# Patient Record
Sex: Male | Born: 1945
Health system: Southern US, Community
[De-identification: ages and names within clinical notes are randomized; demographics above are authoritative.]

## PROBLEM LIST (undated history)

## (undated) DIAGNOSIS — K8689 Other specified diseases of pancreas: Secondary | ICD-10-CM

## (undated) DIAGNOSIS — Z9889 Other specified postprocedural states: Secondary | ICD-10-CM

## (undated) DIAGNOSIS — Z8719 Personal history of other diseases of the digestive system: Secondary | ICD-10-CM

## (undated) DIAGNOSIS — R7612 Nonspecific reaction to cell mediated immunity measurement of gamma interferon antigen response without active tuberculosis: Secondary | ICD-10-CM

## (undated) DIAGNOSIS — R0609 Other forms of dyspnea: Secondary | ICD-10-CM

## (undated) DIAGNOSIS — Z8619 Personal history of other infectious and parasitic diseases: Secondary | ICD-10-CM

## (undated) DIAGNOSIS — K861 Other chronic pancreatitis: Secondary | ICD-10-CM

## (undated) DIAGNOSIS — R0600 Dyspnea, unspecified: Secondary | ICD-10-CM

## (undated) DIAGNOSIS — D860 Sarcoidosis of lung: Secondary | ICD-10-CM

## (undated) DIAGNOSIS — H409 Unspecified glaucoma: Secondary | ICD-10-CM

## (undated) DIAGNOSIS — N323 Diverticulum of bladder: Secondary | ICD-10-CM

## (undated) DIAGNOSIS — R296 Repeated falls: Secondary | ICD-10-CM

## (undated) DIAGNOSIS — R319 Hematuria, unspecified: Secondary | ICD-10-CM

## (undated) DIAGNOSIS — Z859 Personal history of malignant neoplasm, unspecified: Secondary | ICD-10-CM

## (undated) HISTORY — PX: ESOPHAGOGASTRODUODENOSCOPY: SHX1529

---

## 1980-07-16 HISTORY — PX: HERNIA REPAIR: SHX51

## 1998-08-26 ENCOUNTER — Emergency Department (HOSPITAL_COMMUNITY): Admission: EM | Admit: 1998-08-26 | Discharge: 1998-08-26 | Payer: Self-pay | Admitting: Emergency Medicine

## 1998-10-27 ENCOUNTER — Other Ambulatory Visit: Admission: RE | Admit: 1998-10-27 | Discharge: 1998-10-27 | Payer: Self-pay | Admitting: Internal Medicine

## 1998-11-25 ENCOUNTER — Encounter: Payer: Self-pay | Admitting: Pulmonary Disease

## 1998-11-25 ENCOUNTER — Ambulatory Visit: Admission: RE | Admit: 1998-11-25 | Discharge: 1998-11-25 | Payer: Self-pay | Admitting: Pulmonary Disease

## 2001-03-19 ENCOUNTER — Emergency Department (HOSPITAL_COMMUNITY): Admission: EM | Admit: 2001-03-19 | Discharge: 2001-03-20 | Payer: Self-pay

## 2001-07-20 ENCOUNTER — Encounter: Payer: Self-pay | Admitting: Emergency Medicine

## 2001-07-20 ENCOUNTER — Inpatient Hospital Stay (HOSPITAL_COMMUNITY): Admission: EM | Admit: 2001-07-20 | Discharge: 2001-07-21 | Payer: Self-pay | Admitting: Emergency Medicine

## 2001-07-23 ENCOUNTER — Encounter: Admission: RE | Admit: 2001-07-23 | Discharge: 2001-07-23 | Payer: Self-pay

## 2005-05-04 ENCOUNTER — Emergency Department (HOSPITAL_COMMUNITY): Admission: EM | Admit: 2005-05-04 | Discharge: 2005-05-04 | Payer: Self-pay | Admitting: Emergency Medicine

## 2005-06-10 ENCOUNTER — Emergency Department (HOSPITAL_COMMUNITY): Admission: EM | Admit: 2005-06-10 | Discharge: 2005-06-10 | Payer: Self-pay | Admitting: *Deleted

## 2005-06-15 ENCOUNTER — Emergency Department (HOSPITAL_COMMUNITY): Admission: EM | Admit: 2005-06-15 | Discharge: 2005-06-15 | Payer: Self-pay | Admitting: Emergency Medicine

## 2006-06-25 ENCOUNTER — Emergency Department (HOSPITAL_COMMUNITY): Admission: EM | Admit: 2006-06-25 | Discharge: 2006-06-25 | Payer: Self-pay | Admitting: Emergency Medicine

## 2006-07-15 ENCOUNTER — Ambulatory Visit: Payer: Self-pay | Admitting: Family Medicine

## 2006-07-18 ENCOUNTER — Ambulatory Visit: Payer: Self-pay | Admitting: *Deleted

## 2006-08-30 ENCOUNTER — Emergency Department (HOSPITAL_COMMUNITY): Admission: EM | Admit: 2006-08-30 | Discharge: 2006-08-30 | Payer: Self-pay | Admitting: Emergency Medicine

## 2006-10-31 ENCOUNTER — Emergency Department (HOSPITAL_COMMUNITY): Admission: EM | Admit: 2006-10-31 | Discharge: 2006-11-01 | Payer: Self-pay | Admitting: Emergency Medicine

## 2007-03-26 ENCOUNTER — Emergency Department (HOSPITAL_COMMUNITY): Admission: EM | Admit: 2007-03-26 | Discharge: 2007-03-26 | Payer: Self-pay | Admitting: Emergency Medicine

## 2007-03-27 ENCOUNTER — Emergency Department (HOSPITAL_COMMUNITY): Admission: EM | Admit: 2007-03-27 | Discharge: 2007-03-27 | Payer: Self-pay | Admitting: Emergency Medicine

## 2007-04-02 ENCOUNTER — Encounter (INDEPENDENT_AMBULATORY_CARE_PROVIDER_SITE_OTHER): Payer: Self-pay | Admitting: *Deleted

## 2007-08-21 ENCOUNTER — Emergency Department (HOSPITAL_COMMUNITY): Admission: EM | Admit: 2007-08-21 | Discharge: 2007-08-21 | Payer: Self-pay | Admitting: Emergency Medicine

## 2007-08-24 ENCOUNTER — Emergency Department (HOSPITAL_COMMUNITY): Admission: EM | Admit: 2007-08-24 | Discharge: 2007-08-24 | Payer: Self-pay | Admitting: Emergency Medicine

## 2007-08-27 ENCOUNTER — Ambulatory Visit (HOSPITAL_COMMUNITY): Admission: RE | Admit: 2007-08-27 | Discharge: 2007-08-27 | Payer: Self-pay | Admitting: Internal Medicine

## 2007-09-11 ENCOUNTER — Ambulatory Visit: Payer: Self-pay | Admitting: Internal Medicine

## 2007-09-16 ENCOUNTER — Ambulatory Visit (HOSPITAL_BASED_OUTPATIENT_CLINIC_OR_DEPARTMENT_OTHER): Admission: RE | Admit: 2007-09-16 | Discharge: 2007-09-16 | Payer: Self-pay | Admitting: General Surgery

## 2007-09-16 ENCOUNTER — Encounter (INDEPENDENT_AMBULATORY_CARE_PROVIDER_SITE_OTHER): Payer: Self-pay | Admitting: General Surgery

## 2007-09-16 HISTORY — PX: OTHER SURGICAL HISTORY: SHX169

## 2008-03-28 ENCOUNTER — Emergency Department (HOSPITAL_COMMUNITY): Admission: EM | Admit: 2008-03-28 | Discharge: 2008-03-28 | Payer: Self-pay | Admitting: Emergency Medicine

## 2008-10-15 ENCOUNTER — Emergency Department (HOSPITAL_COMMUNITY): Admission: EM | Admit: 2008-10-15 | Discharge: 2008-10-15 | Payer: Self-pay | Admitting: Family Medicine

## 2009-06-26 ENCOUNTER — Emergency Department (HOSPITAL_COMMUNITY): Admission: EM | Admit: 2009-06-26 | Discharge: 2009-06-26 | Payer: Self-pay | Admitting: Emergency Medicine

## 2009-08-14 ENCOUNTER — Emergency Department (HOSPITAL_COMMUNITY): Admission: EM | Admit: 2009-08-14 | Discharge: 2009-08-14 | Payer: Self-pay | Admitting: Emergency Medicine

## 2009-08-17 ENCOUNTER — Emergency Department (HOSPITAL_COMMUNITY): Admission: EM | Admit: 2009-08-17 | Discharge: 2009-08-17 | Payer: Self-pay | Admitting: Emergency Medicine

## 2009-10-04 ENCOUNTER — Emergency Department (HOSPITAL_COMMUNITY): Admission: EM | Admit: 2009-10-04 | Discharge: 2009-10-04 | Payer: Self-pay | Admitting: Emergency Medicine

## 2009-12-14 ENCOUNTER — Emergency Department (HOSPITAL_COMMUNITY): Admission: EM | Admit: 2009-12-14 | Discharge: 2009-12-14 | Payer: Self-pay | Admitting: Family Medicine

## 2010-08-31 ENCOUNTER — Inpatient Hospital Stay (HOSPITAL_COMMUNITY)
Admission: EM | Admit: 2010-08-31 | Discharge: 2010-09-04 | DRG: 391 | Disposition: A | Discharge status: Home / Self Care | Payer: Self-pay | Attending: Internal Medicine | Admitting: Internal Medicine

## 2010-08-31 ENCOUNTER — Inpatient Hospital Stay (INDEPENDENT_AMBULATORY_CARE_PROVIDER_SITE_OTHER)
Admission: RE | Admit: 2010-08-31 | Discharge: 2010-08-31 | Disposition: A | Discharge status: Home / Self Care | Payer: Self-pay | Source: Ambulatory Visit

## 2010-08-31 ENCOUNTER — Emergency Department (HOSPITAL_COMMUNITY): Payer: Self-pay

## 2010-08-31 DIAGNOSIS — Z8614 Personal history of Methicillin resistant Staphylococcus aureus infection: Secondary | ICD-10-CM

## 2010-08-31 DIAGNOSIS — K279 Peptic ulcer, site unspecified, unspecified as acute or chronic, without hemorrhage or perforation: Secondary | ICD-10-CM | POA: Diagnosis present

## 2010-08-31 DIAGNOSIS — J99 Respiratory disorders in diseases classified elsewhere: Secondary | ICD-10-CM | POA: Diagnosis present

## 2010-08-31 DIAGNOSIS — R918 Other nonspecific abnormal finding of lung field: Secondary | ICD-10-CM | POA: Diagnosis present

## 2010-08-31 DIAGNOSIS — Z23 Encounter for immunization: Secondary | ICD-10-CM

## 2010-08-31 DIAGNOSIS — Z880 Allergy status to penicillin: Secondary | ICD-10-CM

## 2010-08-31 DIAGNOSIS — D869 Sarcoidosis, unspecified: Secondary | ICD-10-CM | POA: Diagnosis present

## 2010-08-31 DIAGNOSIS — D62 Acute posthemorrhagic anemia: Secondary | ICD-10-CM | POA: Diagnosis present

## 2010-08-31 DIAGNOSIS — K920 Hematemesis: Secondary | ICD-10-CM

## 2010-08-31 DIAGNOSIS — K2289 Other specified disease of esophagus: Secondary | ICD-10-CM | POA: Diagnosis present

## 2010-08-31 DIAGNOSIS — K228 Other specified diseases of esophagus: Secondary | ICD-10-CM | POA: Diagnosis present

## 2010-08-31 DIAGNOSIS — K208 Other esophagitis without bleeding: Principal | ICD-10-CM | POA: Diagnosis present

## 2010-08-31 DIAGNOSIS — Z85828 Personal history of other malignant neoplasm of skin: Secondary | ICD-10-CM

## 2010-08-31 LAB — GLUCOSE, CAPILLARY: Glucose-Capillary: 95 mg/dL (ref 70–99)

## 2010-08-31 LAB — URINALYSIS, ROUTINE W REFLEX MICROSCOPIC
Bilirubin Urine: NEGATIVE
Hgb urine dipstick: NEGATIVE
Ketones, ur: NEGATIVE mg/dL
Protein, ur: NEGATIVE mg/dL
Urine Glucose, Fasting: NEGATIVE mg/dL
pH: 5.5 (ref 5.0–8.0)

## 2010-08-31 LAB — COMPREHENSIVE METABOLIC PANEL
AST: 44 U/L — ABNORMAL HIGH (ref 0–37)
Albumin: 3.5 g/dL (ref 3.5–5.2)
Alkaline Phosphatase: 46 U/L (ref 39–117)
BUN: 13 mg/dL (ref 6–23)
CO2: 26 mEq/L (ref 19–32)
Chloride: 103 mEq/L (ref 96–112)
Creatinine, Ser: 0.63 mg/dL (ref 0.4–1.5)
GFR calc non Af Amer: >60 mL/min (ref >60)
Potassium: 4 mEq/L (ref 3.5–5.1)
Total Bilirubin: 0.8 mg/dL (ref 0.3–1.2)

## 2010-08-31 LAB — CBC
HCT: 41.6 % (ref 39.0–52.0)
MCH: 29.8 pg (ref 26.0–34.0)
MCHC: 34.6 g/dL (ref 30.0–36.0)
MCV: 86 fL (ref 78.0–100.0)
Platelets: 239 10*3/uL (ref 150–400)
RDW: 14 % (ref 11.5–15.5)
WBC: 5.9 10*3/uL (ref 4.0–10.5)

## 2010-08-31 LAB — DIFFERENTIAL
Eosinophils Absolute: 0.3 10*3/uL (ref 0.0–0.7)
Eosinophils Relative: 5 % (ref 0–5)
Lymphocytes Relative: 35 % (ref 12–46)
Lymphs Abs: 2.1 10*3/uL (ref 0.7–4.0)
Monocytes Absolute: 0.8 10*3/uL (ref 0.1–1.0)
Monocytes Relative: 14 % — ABNORMAL HIGH (ref 3–12)

## 2010-08-31 LAB — TYPE AND SCREEN
ABO/RH(D): A POS
Antibody Screen: NEGATIVE

## 2010-08-31 LAB — ABO/RH: ABO/RH(D): A POS

## 2010-08-31 LAB — OCCULT BLOOD, POC DEVICE: Fecal Occult Bld: POSITIVE

## 2010-09-01 ENCOUNTER — Inpatient Hospital Stay (HOSPITAL_COMMUNITY): Payer: Self-pay

## 2010-09-01 LAB — COMPREHENSIVE METABOLIC PANEL
BUN: 12 mg/dL (ref 6–23)
CO2: 27 mEq/L (ref 19–32)
Calcium: 8.3 mg/dL — ABNORMAL LOW (ref 8.4–10.5)
Creatinine, Ser: 0.67 mg/dL (ref 0.4–1.5)
GFR calc non Af Amer: >60 mL/min (ref >60)
Glucose, Bld: 86 mg/dL (ref 70–99)
Total Bilirubin: 1.2 mg/dL (ref 0.3–1.2)

## 2010-09-01 LAB — CBC
HCT: 35.3 % — ABNORMAL LOW (ref 39.0–52.0)
Hemoglobin: 12.1 g/dL — ABNORMAL LOW (ref 13.0–17.0)
MCH: 29.7 pg (ref 26.0–34.0)
MCHC: 34.3 g/dL (ref 30.0–36.0)
MCV: 86.5 fL (ref 78.0–100.0)

## 2010-09-01 LAB — MAGNESIUM: Magnesium: 1.7 mg/dL (ref 1.5–2.5)

## 2010-09-01 LAB — MRSA PCR SCREENING: MRSA by PCR: NEGATIVE

## 2010-09-01 LAB — DIFFERENTIAL
Basophils Relative: 0 % (ref 0–1)
Neutrophils Relative %: 54 % (ref 43–77)

## 2010-09-01 LAB — HEMOGLOBIN AND HEMATOCRIT, BLOOD
HCT: 36.4 % — ABNORMAL LOW (ref 39.0–52.0)
Hemoglobin: 12.5 g/dL — ABNORMAL LOW (ref 13.0–17.0)

## 2010-09-01 NOTE — H&P (Signed)
[name], [name]                 ACCOUNT NO.:  [id number]  MEDICAL RECORD NO.:  [id number]           PATIENT TYPE:  E  LOCATION:  [facility]                         FACILITY:  [facility]  PHYSICIAN:  Talmage Nap, MD  DATE OF BIRTH:  04-17-1946  DATE OF ADMISSION:  08/31/2010 DATE OF DISCHARGE:                             HISTORY & PHYSICAL   PRIMARY CARE PHYSICIAN:  Unassigned.  History obtainable from the patient and the patient's sister.  CHIEF COMPLAINT:  Vomiting blood of about 3 days' duration.  HISTORY OF PRESENT ILLNESS:  The patient is a 65 year old African American male who has been in stable health has a history of the sarcoidosis which has been stable and skin cancer status post excision at the back, was seen at the emergency room with 3-day history of intermittent vomiting which was said to contain blood of about 3 days' duration and this was said to be getting progressively worse.  The patient however denied any history of aspirin ingestion or nonsteroidal anti-inflammatory drugs prior to the onset of the vomiting.  The patient has intermittent vomiting.  He was also retching.  He denied any associated abdominal pain.  He denied any chest pain, weakness, and shortness of breath.  He denied any fever.  He denied any chills.  He denied any rigor.  On the date of presentation to the emergency room, the patient had a massive episode of hematemesis and after evaluating the patient, the patient was advised to be admitted and GI consult urgently has been ordered.  PAST MEDICAL HISTORY: 1. Positive for sarcoidosis. 2. Peptic ulcer disease. 3. History of MRSA. 4. History of skin cancer at the back.  PAST SURGICAL HISTORY: 1. Skin cancer at the back, status post excision. 2. Hemorrhoidectomy. 3. Hernia repair.  MEDICATIONS:  He has no known preadmission meds.  ALLERGIES:  PENICILLIN.  SOCIAL HISTORY:  Negative for alcohol or tobacco use.  FAMILY HISTORY:  Positive  for coronary artery disease.  REVIEW OF SYSTEMS:  The patient denies any history of headaches.  No blurry vision.  Nausea persistent.  Hematemesis with associated retching, he denied any chest pain.  No PND or orthopnea.  No abdominal discomfort.  No diarrhea or hematochezia.  No dysuria or hematuria.  No swelling of the lower extremity.  No intolerance to heat or cold and no neuropsychiatric disorder.  PHYSICAL EXAMINATION:  VITAL SIGNS:  On examination, middle-aged man  vomiting blood copiously but not in any acute respiratory distress.   Vital Signs: Blood pressure is 105/79, pulse is 79, respirations 21, and temperature is 98.0. HEENT:  Mild pallor.  Pupils are reactive to light and extraocular muscles are intact. NECK:  No jugular venous distention.  No carotid bruit.  No lymphadenopathy. CHEST:  Clear to auscultation. HEART:  Sounds are 1 and 2. ABDOMEN:  Soft and nontender.  Liver, spleen tip not palpable.  Bowel sounds are positive. EXTREMITIES:  No pedal edema. NEUROLOGIC:  Nonfocal. MUSCULOSKELETAL:  Unremarkable. SKIN:  Normal turgor.  LABORATORY DATA:  Lab data lactic acid level is 1.1.  Coagulation profile. APTT is 27.  Fecal occult blood  test is said to be positive. Lipase is 19.  Chemistry showed sodium of 136, potassium 4.0, chloride of 102 with a bicarb of 26, glucose is 93, BUN is 13, creatinine 0.60. LFT AST 44, ALT 32, alkaline phosphatase 46, and total bilirubin 0.8. Urinalysis unremarkable.  PT 14.2, INR 1.08.  Hematological indices showed WBC of 5.9, hemoglobin of 14.4, hematocrit of 41.6, MCV of 6.0 with a platelet count of 239, monocyte of 14%.  Imaging studies done include chest x-ray which showed chronic interstitial disease consistent with sarcoidosis with right bibasilar infiltrate.  IMPRESSION: 1. Upper gastrointestinal bleed,Questionable Mallory-weiss tear. 2. Peptic ulcer disease. 3. Sarcoidosis, stable. 4. History of skin cancer of the back,  status post excision.  PLAN:  To admit the patient to step-down.  The patient will be given normal saline IV to go at the rate of 125 mL an hour, morphine 2 mg IV q.4 p.r.n. pain control, Zofran 4 mg IV q.4 p.r.n. nausea and vomiting, and Phenergan 12.5 mg q.4 p.r.n. vomiting.  He will be on Protonix  80mg  IV stat and subsequently Protonix 8mg  per hour continuous infusion. The patient had an NG tube inserted.  He also had TED stockings for DVT prophylaxis.  GI has been consulted stat for evaluation for upper endoscopy.  Further labs to be ordered on this patient include H and H q.6 hourly.  CBC, CMP, and magnesium repeated in a.m.  The patient will be followed and evaluated on day-to-day basis.     Talmage Nap, MD     CN/MEDQ  D:  08/31/2010  T:  08/31/2010  Job:  [id number]  Electronically Signed by Talmage Nap  on 09/01/2010 01:11:45 AM

## 2010-09-02 LAB — BASIC METABOLIC PANEL
BUN: 9 mg/dL (ref 6–23)
Chloride: 108 mEq/L (ref 96–112)
Creatinine, Ser: 0.7 mg/dL (ref 0.4–1.5)
GFR calc non Af Amer: >60 mL/min (ref >60)
Glucose, Bld: 98 mg/dL (ref 70–99)

## 2010-09-02 LAB — CBC
HCT: 36.5 % — ABNORMAL LOW (ref 39.0–52.0)
MCH: 29.9 pg (ref 26.0–34.0)
MCV: 85.9 fL (ref 78.0–100.0)
Platelets: 201 10*3/uL (ref 150–400)
RDW: 14 % (ref 11.5–15.5)

## 2010-09-03 ENCOUNTER — Inpatient Hospital Stay (HOSPITAL_COMMUNITY): Payer: Self-pay

## 2010-09-03 LAB — BASIC METABOLIC PANEL
Calcium: 8.8 mg/dL (ref 8.4–10.5)
GFR calc Af Amer: >60 mL/min (ref >60)
GFR calc non Af Amer: >60 mL/min (ref >60)
Glucose, Bld: 80 mg/dL (ref 70–99)
Potassium: 3.9 mEq/L (ref 3.5–5.1)
Sodium: 139 mEq/L (ref 135–145)

## 2010-09-03 LAB — CBC
HCT: 38 % — ABNORMAL LOW (ref 39.0–52.0)
MCHC: 33.9 g/dL (ref 30.0–36.0)
MCV: 85.2 fL (ref 78.0–100.0)
Platelets: 210 10*3/uL (ref 150–400)
RDW: 13.7 % (ref 11.5–15.5)
WBC: 5 10*3/uL (ref 4.0–10.5)

## 2010-09-04 DIAGNOSIS — D869 Sarcoidosis, unspecified: Secondary | ICD-10-CM

## 2010-09-04 DIAGNOSIS — R042 Hemoptysis: Secondary | ICD-10-CM

## 2010-09-04 DIAGNOSIS — J189 Pneumonia, unspecified organism: Secondary | ICD-10-CM

## 2010-09-04 LAB — CBC
HCT: 39.2 % (ref 39.0–52.0)
Hemoglobin: 13.7 g/dL (ref 13.0–17.0)
MCHC: 34.9 g/dL (ref 30.0–36.0)
WBC: 4.8 10*3/uL (ref 4.0–10.5)

## 2010-09-05 NOTE — Consult Note (Addendum)
NAME:  [name], [name]           ACCOUNT NO.:  [id number]  MEDICAL RECORD NO.:  [id number]           PATIENT TYPE:  I  LOCATION:  2925                         FACILITY:  [facility]  PHYSICIAN:  Coralyn Helling, MD        DATE OF BIRTH:  1946/01/03  DATE OF CONSULTATION:  09/04/2010 DATE OF DISCHARGE:                                CONSULTATION   REFERRING PHYSICIAN:  Lonia Blood, MD  REASON FOR CONSULTATION:  Possible hemoptysis.  CHIEF COMPLAINT:  Vomiting blood.  [name] is a 65 year old male who was previously followed by Dr. Stevphen Rochester for a pulmonary sarcoidosis which from what I can gather was diagnosed by bronchoscopic biopsy approximately 30 years ago.  He has not been on medications for this recently, and in fact, for the last several years, he does not have a regular doctor that he sees.  He does also have a history of peptic ulcer disease.  He presented to [facility] on August 31, 2010, with 3 days history of vomitingintermittently with blood.  He was evaluated by Gastroenterology and had an EGD done which showed erosive mucosa at the GE junction, both on the gastric side and the esophageal side which was felt to be the likely source for his hematemesis.  He has since been having a few episodes of cough with phlegm and questionable blood inside as well.  He denies epistaxis, although he has been having some sinus congestion and feels like he is coming down with a cold.  He denies any feverish feeling, chills, or sweats.  He has not had sore throat or difficulty swallowing. He denies chest pain or wheezing.  He denies abdominal pain.  He has not had any lymph nodes swelling or skin rashes.  He denies gland swelling.  PAST MEDICAL HISTORY:  Significant for: 1. Pulmonary sarcoidosis. 2. Skin cancer. 3. Peptic ulcer disease.  He has allergies to PENICILLIN.  OUTPATIENT MEDICATIONS:  He was not on any outpatient medications.  CURRENT MEDICATIONS: 1. Zofran  p.r.n. 2. Morphine p.r.n. 3. Protonix 40 mg IV q.12 h.  FAMILY HISTORY:  Significant for coronary artery disease.  SOCIAL HISTORY:  He is married and lives with his wife in [place]. There is no history of tobacco abuse.  He is semi-retired.  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  REVIEW OF SYSTEMS:  A 12-point review of systems is unremarkable except for what is stated above.  PHYSICAL EXAMINATION:  GENERAL:  He is seen in his hospital room.  He is awake and alert, does not appear to be in acute distress, speaking in full sentences. VITAL SIGNS:  Temperature is 98.7, blood pressure is 155/80, heart rate is 68, respirations 19, oxygen saturation 97% on room air. HEENT:  Pupils reactive.  Extraocular muscles are intact.  There is no sinus tenderness.  He has a clear nasal discharge.  There are no oral lesions. NECK:  No lymphadenopathy, no thyromegaly.  No jugular venous distention. HEART:  S1 and S2, regular rate and       rhythm.  No murmur. CHEST:  He had  coarse breath sounds bilaterally with faint basilar crackles, but there was no wheezing. ABDOMEN:  Thin, soft, nontender, positive bowel sounds. EXTREMITIES:  There is no edema, cyanosis, or clubbing.  Peripheral pulses were palpable and symmetric. NEUROLOGIC:  He was alert and oriented x3.  Cranial nerves II through XII are intact, and he had 5/5 strength. SKIN:  There are no obvious lesions.  WBC is 4.8, hemoglobin is 13.7, hematocrit is 39.2, platelet count is 241.  Sodium is 138, potassium is 3.9, chloride is 106, CO2 is 28, BUN is 9, creatinine is 0.77, calcium is 8.8.  BNP was 57.  MRSA screen was negative.  Stool for occult blood was positive.  Lactic acid was 1.1. Urinalysis was unremarkable.  Chest x-ray from August 31, 2010, showed chronic interstitial changes with right basilar infiltrate. CT scan of the head from September 03, 2010, was unremarkable. CT scan of the chest without contrast from  September 03, 2010, showed numerous enlarged mediastinal and hilar lymph nodes with partial calcification.  There is diffuse central airway thickening associated with right lower lobe airspace disease, and throughout the remainder of the lungs, there were diffuse nodular densities, consistent with diagnosis of sarcoidosis as well as a calcified granuloma in the right middle lobe.  IMPRESSION: 1. Questionable hemoptysis.  He does have a right lower lobe     infiltrate which appears to be new in the background of chronic     changes from his previous diagnosis of sarcoidosis.  He could have     had an aspiration event, related to his nausea, vomiting, and     hematemesis.  He is fairly stable clinically, otherwise.  What I     would recommend is to get the patient a course of antibiotics and     then he will need a followup chest x-ray within the next several     days to determine if his infiltrate is clearing.  I do not think     that he would require bronchoscopy at this time; however, if his     symptoms were to worsen or if he would get progressively worsening     hemoptysis, he would likely need bronchoscopy at that time. 2. Pulmonary sarcoidosis.  He has chronic changes but seems to be     fairly well compensated with his respiratory status, and I do not     think that he would need specific therapy for this at this time,     but he will need to have further followup on an outpatient basis. 3. Hematemesis with a history of peptic ulcer disease and erosive     mucosa at the gastroesophageal junction.  This is being managed by     the hospitalist team. 4. The patient could be discharged home on oral antibiotics and then     have outpatient followup arranged.  I will defer timing of hospital     discharge to the hospitalist team.    Coralyn Helling, MD    VS/MEDQ  D:  09/04/2010  T:  09/05/2010  Job:  [id number]  Electronically Signed by Coralyn Helling MD on 09/05/2010 10:54:50 AM

## 2010-09-12 ENCOUNTER — Ambulatory Visit: Payer: Self-pay | Admitting: Pulmonary Disease

## 2010-09-14 NOTE — Discharge Summary (Signed)
  NAME:  [name], [name]           ACCOUNT NO.:  [id number]  MEDICAL RECORD NO.:  [id number]           PATIENT TYPE:  LOCATION:                                 FACILITY:  PHYSICIAN:  Lonia Blood, M.D.       DATE OF BIRTH:  07/16/1946  DATE OF ADMISSION: DATE OF DISCHARGE:                              DISCHARGE SUMMARY   PRIMARY CARE PHYSICIAN:  Fleet Contras, MD  DISCHARGE DIAGNOSES: 1. Gastrointestinal bleeding due to esophagitis, resolved. 2. Mild acute blood loss anemia. 3. Pulmonary sarcoidosis. 4. Pulmonary infiltrate, probably due to aspirated blood from the     hematemesis.  DISCHARGE MEDICATIONS: 1. Omeprazole 20 mg daily. 2. Levaquin 500 mg daily for 7 days. 3. Zofran 4 mg every 6 hours as needed for nausea.  CONDITION ON DISCHARGE:  [name] was discharged in good condition.  He will follow up with his primary care physician, with the gastroenterologist, Dr. Carman Ching, and with the pulmonologist, Dr. Coralyn Helling.PROCEDURE THIS ADMISSION: 1. On August 22, 2010, the patient underwent an upper endoscopy with     findings of erosive esophagitis. 2. CT scan of the head which was negative for sinusitis. 3. Chest CT without contrast which was positive for extensive     underlying lung disease, reticulonodular pattern consistent with     sarcoidosis, right lower lobe airspace disease, question aspirated     blood.  HISTORY AND PHYSICAL:  Refer to the dictated H and P done by Dr. Beverly Gust.  HOSPITAL COURSE:  [name] is a 65 year old gentleman with known sarcoidosis who presented to the emergency room with complaints of vomiting blood.  He was taken to the endoscopy suite where he was found to have some mild erosive esophagitis.  He was placed on n.p.o. and proton pump inhibitors intravenously.  As soon as we restarted a liquid diet, the patient started vomiting again, traces of blood.  At that point in time, we proceeded with investigation to see if he does  not have any hemoptysis when he swallows the blood.  After careful investigation even though the patient has extensive sarcoidosis, we felt that he still had only hematemesis and no hemoptysis.  We felt that he could benefit from an antibiotic and he will followup in the pulmonary clinic with Dr. Craige Cotta.  The patient's diet was advanced and he remained hemodynamically stable without any significant anemia from his mild hematemesis.  He was continued on a proton pump inhibitor and he will take that treatment in the outpatient setting as well.     Lonia Blood, M.D.     SL/MEDQ  D:  09/04/2010  T:  09/05/2010  Job:  [id number]  cc:   Fleet Contras, M.D. James L. Malon Kindle., M.D. Coralyn Helling, MD  Electronically Signed by Lonia Blood M.D. on 09/14/2010 05:32:19 PM

## 2010-10-08 LAB — GC/CHLAMYDIA PROBE AMP, GENITAL
Chlamydia, DNA Probe: NEGATIVE
GC Probe Amp, Genital: NEGATIVE

## 2010-10-08 LAB — RPR: RPR Ser Ql: NONREACTIVE

## 2010-10-17 NOTE — Consult Note (Signed)
  [name], [name]                 ACCOUNT NO.:  [id number]  MEDICAL RECORD NO.:  [id number]           PATIENT TYPE:  I  LOCATION:  2925                         FACILITY:  [facility]  PHYSICIAN:  Graylin Shiver, M.D.   DATE OF BIRTH:  [birth year]  DATE OF CONSULTATION:  08/31/2010 DATE OF DISCHARGE:                                CONSULTATION   REASON FOR CONSULTATION:  The patient is a 65 year old black male with complaints of vomiting blood for the past 4 days.  He describes the blood is bright and dark.  He has been vomiting about every 15 hours. It feels like something builds up and then he has to vomit.  He denied melena, but his stool is heme positive.  He has no complaints of abdominal pain or heartburn.  He does give a history of peptic ulcer disease 3 years ago diagnosed by EGD done by Dr. Virginia Rochester.  At that time, he apparently had a GI bleed.  He has no other complaints.  PAST MEDICAL HISTORY:  Sarcoidosis.  MEDICATIONS:  None.  SOCIAL HISTORY:  He does not smoke or drink alcohol.  He gives no other medical problems including no problems with his heart, lungs, liver or kidneys.  REVIEW OF SYSTEMS:  He is not complaining of chest pain, shortness of breath, cough or sputum production.  PHYSICAL EXAMINATION:  GENERAL:  He is in no acute distress.  He is nonicteric. VITAL SIGNS:  Currently stable. HEART:  Regular rhythm.  No murmurs. LUNGS:  Are clear. ABDOMEN:  Bowel sounds are normal.  It is soft and nontender.  There is no obvious hepatosplenomegaly.  LABORATORY DATA:  Hemoglobin and hematocrit are 14.4 and 41.6 respectively.  Prothrombin time is 14.2.  Electrolytes are unremarkable.  IMPRESSION: 1. Upper gastrointestinal bleed, etiology unclear. 2. History of peptic ulcer disease. 3. History of sarcoidosis.  PLAN:  The patient is being admitted to the hospital by the [facility].  He will be on PPI therapy.  We will proceed with EGD this evening in the  emergency room to further evaluate his vomiting blood.  He does have an NG tube placed and there is blood in the suction container.          ______________________________ Graylin Shiver, M.D.     SFG/MEDQ  D:  09/01/2010  T:  09/01/2010  Job:  [id number]  cc:   [facility]  Electronically Signed by Herbert Moors MD on 10/17/2010 04:37:59 PM

## 2010-10-17 NOTE — Op Note (Signed)
  NAME:  TSUNEO, [name]           ACCOUNT NO.:  [id number]  MEDICAL RECORD NO.:  [id number]           PATIENT TYPE:  I  LOCATION:  2925                         FACILITY:  [facility]  PHYSICIAN:  Graylin Shiver, M.D.   DATE OF BIRTH:  Sep 03, 1945  DATE OF PROCEDURE:  09/01/2010 DATE OF DISCHARGE:                              OPERATIVE REPORT   INDICATIONS FOR PROCEDURE:  Hematemesis.  Informed consent was obtained after explanation of the risks of bleeding, infection, and perforation.  PREMEDICATION:  Fentanyl 70 mcg IV, Versed 4 mg IV.  PROCEDURE:  With the patient in the left lateral decubitus position, the Pentax gastroscope was inserted into the oropharynx and passed into the esophagus.  It was advanced down the esophagus, then into the stomach, and into the duodenum.  The second portion and bulb of the duodenum looked normal.  The stomach had a normal-appearing pylorus area and normal-appearing antrum.  The body of the stomach looked normal and retroflexion of the upper fundus and cardia also looked normal.  At the esophagogastric junction area, there was erosive-appearing mucosa and on the gastric side, there were some small clots adherent to this region, but there were no clots on the esophageal side.  There was no specific large ulcer.  There was no reason that I could see for any therapeutic measures at this time.  The body of the esophagus and upper portion of the esophagus looked normal.  He tolerated the procedure well without complications.  IMPRESSION:  Erosive mucosa at the region of the esophagogastric junction, both on the gastric side and esophageal side, which I believe is the source for his problem of hematemesis.  PLAN:  Follow H and Hs, place the patient on PPI therapy.  Follow clinically.          ______________________________ Graylin Shiver, M.D.     SFG/MEDQ  D:  09/02/2010  T:  09/02/2010  Job:  [id number]  Electronically Signed by Herbert Moors MD  on 10/17/2010 04:38:01 PM

## 2010-11-28 NOTE — Op Note (Signed)
[name], NO                 ACCOUNT NO.:  [id number]   MEDICAL RECORD NO.:  [id number]          PATIENT TYPE:  AMB   LOCATION:  [facility]                         [facility]ACILITY:  [facility]   PHYSICIAN:  Anselm Pancoast. Weatherly, M.D.DATE OF BIRTH:  [birth year]   DATE OF PROCEDURE:  09/16/2007  DATE OF DISCHARGE:                               OPERATIVE REPORT   PREOPERATIVE DIAGNOSES:  Lesion back that is a low grade squamous cell  carcinoma.   POSTOPERATIVE DIAGNOSES:  Not given.   OPERATION:  Re-excision of lesion back that is greater than 3 cm.  General anesthesia, prone position.   HISTORY:  [name] is a 65 year old black male who I first saw for a  lesion that was at the base of his neck that had been increasing in size  for probably 2 years. This was about the size of a lemon when I first  saw [name] and on August 06, 2007 and I removed the area with local  anesthesia. It looked like a chronically irritated but not actually  infected large sebaceous cyst but the base of the lesion was a much  colorful flower type appearance than the usual sebaceous cyst and I  think I completely excised everything originally. The path report  however showed that this was a low grade Trichilemmal squamous cell  carcinoma with surrounding inflammation and fibrosis and I recommended  that we re-excise the area after the area had healed. The original  incision had been packed like an infected sebaceous cyst and this is  closed over the last 6 weeks, so he has now just got a little bit of  granulation tissue with no evidence of any infection or problems.  He  has of course returned to work and I had recommended that we excise this  today.  I did start [name] on p.o. Keflex last evening and he has had two  doses.  The patient preoperatively had a chest x-ray and laboratory  studies. These studies were unremarkable except that they question  possibly a little nodule on the chest x-ray and recommended a CT if we  do not have a comparison film and we will work that up postoperatively.  The patient's white count was 5700 and he was given a gram of Ancef  preoperatively.  His hematocrit is 42.3.  The patient was induced with  general anesthesia and after he was intubated, we then rolled [name] over  with chest rolls and first had the arms up beside his neck but because  this kind of gives me not the good being able to get as close to the  patient, I put the left side down by the side of his body so I could get  a little closer for exposure approaching the area. I then elected to  excise this transversely after it was prepped with Betadine solution and  removed all the underlying adipose and fascia down to the actual muscle  and basically did not try to dissect out anything but just did an  excision that will include probably a 0.5 cm of fibrous and  surrounding  tissue from where the area was originally excised. There were numerous  little bleeders down at the medial end of the base that required  suturing with 4-0 Vicryl. I used cautery and sharp dissection. After  everything was completely excised, I marked the lesion with a short  stitch towards the base of the neck and a long stitch towards the  midline for orientation. I then raised a little bit of flaps superiorly  and inferiorly so that this area could be brought together with  subcuticular 4-0 Vicryl  and then the skin itself was closed with  interrupted mattress sutures of 3-0 nylon. The patient tolerated the  procedure nicely. I did put about 10 mL of 0.5% Marcaine plain in the  subcutaneous tissue for immediate postop pain management and then triple  antibiotic ointment on the skin itself. The patient tolerated the  procedure nicely and was extubated and will be released after a short  stay in the recovery room. He will continue his antibiotics. I think he  has got 3 days  worth until they are completed. I will give [name] Vicodin for pain. 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 I will see [name] in the office on Monday and we will probably  leave most of the stitches for a total of about 2 weeks.           ______________________________  Anselm Pancoast. Zachery Dakins, M.D.     WJW/MEDQ  D:  09/16/2007  T:  09/16/2007  Job:  [id number]

## 2010-12-01 NOTE — Consult Note (Signed)
[place]. [facility]  Patient:    [name], [name] Visit Number: [id number] MRN: [id number]          Service Type: MED Location: 5000 [phone] Attending Physician:  Effie Berkshire Dictated by:   Sabino Gasser, M.D. Proc. Date: 07/20/01 Admit Date:  07/20/2001 Discharge Date: 07/20/2001   CC:         Dr. 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  The patient states that he awoke this morning, thought he had some spittle, but what came out were copious amounts of blood, and he has vomited a few times as well, and bright blood came out. He soaked a towel with it, and he came to the hospital.  He states he has never had anything occur like this before.  PAST MEDICAL HISTORY:  Notable for sarcoidosis approximately 20 years ago.  He states he was seen by Dr. Jerl Mina about a year ago for complete physical examination and was found to be healthy.  He does not smoke, does not drink.  MEDICATIONS:  None.  ALLERGIES:  No allergies to medication except for PENICILLIN.  PAST SURGICAL HISTORY:  Hernia repair many years ago, and he had rectal prolapse surgery a number of years ago as well.  REVIEW OF SYSTEMS:  The patient states that he has had burning epigastric pain whenever he gets upset.  He would notice that he would get burning in the epigastrium.  This is something he has had intermittently for years and has never really treated it.  The patient states that he had the same pain last night.  His son, who is 23, and he had some differences about church.  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  He denies NSAIDs.  He had no family history of gastrointestinal problems.  He has been treated for a "ulcer" perhaps 20 years  ago.  PHYSICAL EXAMINATION:  GENERAL:  He is an alert, oriented, softspoken man in no distress.  He is well-nourished, well-developed.  VITAL SIGNS:  Temperature 98.4, pulse 103 which eventually came down to 68, blood pressure 122/78, with 96% O2 saturation on room air.  There was no orthostasis noted on examination.  HEENT:  Unremarkable.  NECK:  Thyroid not enlarged, no bruits heard.  LUNGS:  Clear.  HEART:  Regular rate and rhythm without murmur, gallop, or rub.  ABDOMEN:  Benign.  NG was placed, and there was bright red blood from the NG.  LABORATORY DATA:  Pro time, PTT, liver function tests were all normal.  Lipase was normal.  Hemoglobin 14.9, platelet count 278,000.  Chest x-ray showed interstitial lung disease.  IMPRESSION:  This is a 65 year old gentleman with chronic, recurring abdominal pain reminiscent of possible ulcer disease.  There is a remote history of possible ulcer, and he presents with bright red hematemesis.  Rule out acute active ulcer disease.  PLAN:  Endoscopy.  We explained the procedure to the pain with the risks, benefits, and rationale.  He is willing to proceed. Dictated by:   Sabino Gasser, M.D. Attending Physician:  Effie Berkshire DD:  07/20/01 TD:  07/21/01 Job: [id number]

## 2010-12-01 NOTE — Procedures (Signed)
[place]. [facility]  Patient:    [name], [name] Visit Number: [id number] MRN: [id number]          Service Type: MED Location: 5000 [phone] Attending Physician:  Effie Berkshire Dictated by:   Sabino Gasser, M.D. Admit Date:  07/20/2001 Discharge Date: 07/20/2001   CC:         Dewayne Shorter, M.D.  Dr. Robert Bellow, [facility]   Procedure Report  PROCEDURE:  Upper endoscopy with biopsy.  SURGEON:  Sabino Gasser, M.D.  INDICATIONS:  Acute gastrointestinal bleeding.  ANESTHESIA:  Demerol 50 mg and Versed 7.5 mg were given.  DESCRIPTION OF PROCEDURE:  With the patient mildly sedated in the left lateral decubitus position in room #1 of the emergency room at [facility], the Olympus videoscopic endoscope was then inserted in the mouth and passed under direct vision into the esophagus which appeared normal throughout its course. We entered into the stomach.  The fundus and body appeared normal.  The antrum appeared slightly erythematous.  There was a small amount of blood ______ seen in the antrum.  We entered into the duodenal bulb very easily and no abnormalities were seen here or in the second portion of the duodenum as we proceeded.  The endoscope was then slowly withdrawn, taking circumferential views of the entire duodenal mucosa visualized until the endoscope had been pulled back into the stomach, placed on retroflexion to view the stomach from below, and this was photographed.  The endoscope was then straightened.  As we straightened, we saw that there was bleeding emanating from the superior portion of the pyloric channel, an area that we had noted in passing to be somewhat eroded, but minimally so.  We then took a biopsy of this area for Helicobacter and withdrew the endoscope, taking circumferential views of the remaining gastric and esophageal mucosa which otherwise appeared normal.  The patients vital signs and pulse oximeter  remained stable.  The patient tolerated the procedure well and without apparent complications.  FINDINGS:  Friability of the pyloric channel with bleeding seen from this point.  PLAN:  Proton pump inhibitors and may start liquid diet, follow the patient clinically, and follow hematocrit, and await biopsy report. Dictated by:   Sabino Gasser, M.D. Attending Physician:  Effie Berkshire DD:  07/20/01 TD:  07/21/01 Job: [id number]

## 2011-01-31 ENCOUNTER — Inpatient Hospital Stay (INDEPENDENT_AMBULATORY_CARE_PROVIDER_SITE_OTHER)
Admission: RE | Admit: 2011-01-31 | Discharge: 2011-01-31 | Disposition: A | Discharge status: Home / Self Care | Payer: Self-pay | Source: Ambulatory Visit | Attending: Emergency Medicine | Admitting: Emergency Medicine

## 2011-01-31 DIAGNOSIS — H6120 Impacted cerumen, unspecified ear: Secondary | ICD-10-CM

## 2011-01-31 LAB — POCT URINALYSIS DIP (DEVICE)
Bilirubin Urine: NEGATIVE
Leukocytes, UA: NEGATIVE
Nitrite: NEGATIVE
Protein, ur: NEGATIVE mg/dL
pH: 6 (ref 5.0–8.0)

## 2011-02-01 LAB — GC/CHLAMYDIA PROBE AMP, GENITAL
Chlamydia, DNA Probe: NEGATIVE
GC Probe Amp, Genital: NEGATIVE

## 2011-04-06 LAB — PROTIME-INR: Prothrombin Time: 14.4

## 2011-04-06 LAB — URINALYSIS, ROUTINE W REFLEX MICROSCOPIC
Glucose, UA: NEGATIVE
Hgb urine dipstick: NEGATIVE
Ketones, ur: NEGATIVE
Protein, ur: NEGATIVE
Urobilinogen, UA: 1

## 2011-04-06 LAB — CBC
HCT: 40.7
MCV: 88.1
Platelets: 270
RDW: 13.9

## 2011-04-06 LAB — I-STAT 8, (EC8 V) (CONVERTED LAB)
BUN: 15
Bicarbonate: 25.3 — ABNORMAL HIGH
Glucose, Bld: 96
Hemoglobin: 15
pCO2, Ven: 39.2 — ABNORMAL LOW
pH, Ven: 7.417 — ABNORMAL HIGH

## 2011-04-06 LAB — DIFFERENTIAL
Basophils Absolute: 0
Basophils Relative: 0
Eosinophils Absolute: 0.2
Eosinophils Relative: 4

## 2011-04-06 LAB — POCT I-STAT CREATININE: Creatinine, Ser: 0.7

## 2011-04-09 LAB — CBC
HCT: 42.3
Hemoglobin: 14.6
MCHC: 34.7
MCV: 87.9
Platelets: 245
RBC: 4.81
RDW: 14.2
WBC: 5.7

## 2011-04-09 LAB — DIFFERENTIAL
Eosinophils Absolute: 0.2
Lymphocytes Relative: 42
Lymphs Abs: 2.4
Monocytes Relative: 15 — ABNORMAL HIGH
Neutrophils Relative %: 40 — ABNORMAL LOW

## 2011-04-09 LAB — COMPREHENSIVE METABOLIC PANEL
AST: 44 — ABNORMAL HIGH
Albumin: 3.3 — ABNORMAL LOW
Alkaline Phosphatase: 55
BUN: 11
Chloride: 102
GFR calc non Af Amer: >60
Potassium: 4.1
Total Bilirubin: 1

## 2011-04-27 LAB — COMPREHENSIVE METABOLIC PANEL
Albumin: 3.1 — ABNORMAL LOW
Alkaline Phosphatase: 49
BUN: 14
CO2: 27
Chloride: 107
Creatinine, Ser: 0.71
GFR calc non Af Amer: >60
Glucose, Bld: 103 — ABNORMAL HIGH
Potassium: 4.2
Total Bilirubin: 0.5

## 2011-04-27 LAB — DIFFERENTIAL
Basophils Absolute: 0
Basophils Relative: 0
Lymphocytes Relative: 32
Monocytes Absolute: 0.7
Neutro Abs: 2.4
Neutrophils Relative %: 50

## 2011-04-27 LAB — CBC
HCT: 40.5
Hemoglobin: 13.9
MCV: 88
RBC: 4.59
WBC: 4.9

## 2011-04-27 LAB — OCCULT BLOOD X 1 CARD TO LAB, STOOL: Fecal Occult Bld: NEGATIVE

## 2011-04-27 LAB — PROTIME-INR: Prothrombin Time: 13.6

## 2011-04-27 LAB — APTT: aPTT: 30

## 2011-08-24 ENCOUNTER — Emergency Department (HOSPITAL_COMMUNITY)
Admission: EM | Admit: 2011-08-24 | Discharge: 2011-08-25 | Disposition: A | Discharge status: Home / Self Care | Payer: Self-pay | Attending: Emergency Medicine | Admitting: Emergency Medicine

## 2011-08-24 ENCOUNTER — Encounter (HOSPITAL_COMMUNITY): Payer: Self-pay | Admitting: *Deleted

## 2011-08-24 DIAGNOSIS — S339XXA Sprain of unspecified parts of lumbar spine and pelvis, initial encounter: Secondary | ICD-10-CM | POA: Insufficient documentation

## 2011-08-24 DIAGNOSIS — R0609 Other forms of dyspnea: Secondary | ICD-10-CM | POA: Insufficient documentation

## 2011-08-24 DIAGNOSIS — M545 Low back pain, unspecified: Secondary | ICD-10-CM | POA: Insufficient documentation

## 2011-08-24 DIAGNOSIS — S39012A Strain of muscle, fascia and tendon of lower back, initial encounter: Secondary | ICD-10-CM

## 2011-08-24 DIAGNOSIS — R0989 Other specified symptoms and signs involving the circulatory and respiratory systems: Secondary | ICD-10-CM | POA: Insufficient documentation
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
## 2011-08-25 MED ORDER — CYCLOBENZAPRINE HCL 5 MG PO TABS: 5.0000 mg | ORAL_TABLET | Freq: Three times a day (TID) | ORAL | Status: AC | PRN

## 2011-08-25 MED ORDER — IBUPROFEN 600 MG PO TABS: 600.0000 mg | ORAL_TABLET | Freq: Four times a day (QID) | ORAL | Status: AC | PRN

## 2011-08-25 MED ORDER — KETOROLAC TROMETHAMINE 60 MG/2ML IM SOLN
30.0000 mg | Freq: Once | INTRAMUSCULAR | Status: AC
  Administered 2011-08-25: 60 mg via INTRAMUSCULAR
  Filled 2011-08-25: qty 2

## 2011-08-25 NOTE — ED Provider Notes (Signed)
Medical screening examination/treatment/procedure(s) were performed by non-physician practitioner and as supervising physician I was immediately available for consultation/collaboration.  Jasmine Awe, MD 08/25/11 [phone]
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
## 2012-10-27 ENCOUNTER — Other Ambulatory Visit: Payer: Self-pay | Admitting: Infectious Diseases

## 2012-10-27 ENCOUNTER — Ambulatory Visit
Admission: RE | Admit: 2012-10-27 | Discharge: 2012-10-27 | Disposition: A | Discharge status: Home / Self Care | Payer: No Typology Code available for payment source | Source: Ambulatory Visit | Attending: Infectious Diseases | Admitting: Infectious Diseases

## 2012-10-27 DIAGNOSIS — R7611 Nonspecific reaction to tuberculin skin test without active tuberculosis: Secondary | ICD-10-CM

## 2013-03-03 ENCOUNTER — Encounter (HOSPITAL_COMMUNITY): Payer: Self-pay | Admitting: *Deleted

## 2013-03-03 ENCOUNTER — Emergency Department (HOSPITAL_COMMUNITY)
Admission: EM | Admit: 2013-03-03 | Discharge: 2013-03-03 | Discharge status: Against Medical Advice | Payer: BC Managed Care – PPO | Source: Home / Self Care

## 2013-03-03 ENCOUNTER — Telehealth (HOSPITAL_COMMUNITY): Payer: Self-pay | Admitting: *Deleted

## 2013-03-03 DIAGNOSIS — H6123 Impacted cerumen, bilateral: Secondary | ICD-10-CM

## 2013-03-03 NOTE — ED Provider Notes (Signed)
  CSN: [id number]     Arrival date & time 03/03/13  1636 History     First MD Initiated Contact with Patient 03/03/13 1653     Chief Complaint  Patient presents with  . Cerumen Impaction   (Consider location/radiation/quality/duration/timing/severity/associated sxs/prior Treatment) HPI Comments: Per Clyda Hurdle, EMT patient states that he has decreased hearing probably due to buildup of wax. There was no mention of ear pain or other sick symptoms. I was asked by the EMT and she did go ahead and irrigate the ears and I  told her she could. After the ears had been irrigated I went to see the patient and he was not in the room.   Past Medical History  Diagnosis Date  . Sarcoidosis    Past Surgical History  Procedure Laterality Date  . Cancer on neck  2010    back of neck   Family History  Problem Relation Age of Onset  . Hypertension Mother   . Heart failure Father    History  Substance Use Topics  . Smoking status: Never Smoker   . Smokeless tobacco: Not on file  . Alcohol Use: No    Review of Systems  Allergies  Penicillins  Home Medications  No current outpatient prescriptions on file. BP 131/84  Pulse 64  Temp(Src) 98.1 F (36.7 C) (Oral)  Resp 16  SpO2 96% Physical Exam  ED Course   Procedures (including critical care time)  Labs Reviewed - No data to display No results found. 1. Cerumen impaction, bilateral     MDM  Patient had his ears  irrigated till clear. The patient was not seen by the provider because the patient left sometime after having his ears clean.  Hayden Rasmussen, NP 03/03/13 1744  Hayden Rasmussen, NP 03/03/13 1745

## 2013-03-03 NOTE — ED Notes (Addendum)
Pt. not in room. I called pt. to return for assessment by NP and d/c instructions.

## 2013-03-03 NOTE — ED Notes (Signed)
I called pt. and told him we had not finished his assessment and d/c instructions. I asked pt. to come back to finish. [name] 03/03/2013

## 2013-03-03 NOTE — ED Provider Notes (Signed)
Medical screening examination/treatment/procedure(s) were performed by non-physician practitioner and as supervising physician I was immediately available for consultation/collaboration.  Namrata Dangler, M.D.  Jalaya Sarver C Dayn Barich, MD 03/03/13 1846 

## 2013-03-03 NOTE — ED Notes (Signed)
C/o ears being stopped up off and on.  Noticed he could not hear on the phone.  Has not had them flushed out in 2 yrs. here.

## 2013-07-23 ENCOUNTER — Other Ambulatory Visit: Payer: Self-pay | Admitting: Internal Medicine

## 2013-07-23 ENCOUNTER — Ambulatory Visit
Admission: RE | Admit: 2013-07-23 | Discharge: 2013-07-23 | Disposition: A | Discharge status: Home / Self Care | Payer: Medicare HMO | Source: Ambulatory Visit | Attending: Internal Medicine | Admitting: Internal Medicine

## 2013-07-23 DIAGNOSIS — D869 Sarcoidosis, unspecified: Secondary | ICD-10-CM

## 2013-08-07 ENCOUNTER — Encounter: Payer: Self-pay | Admitting: Internal Medicine

## 2013-08-07 ENCOUNTER — Other Ambulatory Visit: Payer: Commercial Managed Care - HMO

## 2013-08-07 ENCOUNTER — Ambulatory Visit (INDEPENDENT_AMBULATORY_CARE_PROVIDER_SITE_OTHER): Payer: Medicare HMO | Admitting: Internal Medicine

## 2013-08-07 VITALS — BP 124/86 | HR 69 | Ht 71.0 in | Wt 185.0 lb

## 2013-08-07 DIAGNOSIS — R0989 Other specified symptoms and signs involving the circulatory and respiratory systems: Secondary | ICD-10-CM

## 2013-08-07 DIAGNOSIS — D869 Sarcoidosis, unspecified: Secondary | ICD-10-CM

## 2013-08-07 DIAGNOSIS — R0609 Other forms of dyspnea: Secondary | ICD-10-CM

## 2013-08-07 DIAGNOSIS — R7611 Nonspecific reaction to tuberculin skin test without active tuberculosis: Secondary | ICD-10-CM

## 2013-08-07 NOTE — Patient Instructions (Signed)
Order- schedule PFT and 6 MWT    Dx dyspnea on exertion  Order- lab- ACE level, Quant TB Gold Assay   Dx Sarcoid  Please bring [place] your recent lab results from your doctor, so we can copy them for out record.

## 2013-08-07 NOTE — Progress Notes (Signed)
08/07/13- 60 yoM never smoker Referred for sarcoidosis.  Pt c/o SOB with exertion, walking up steps, prod cough with white mucous.  Dx'ed with sarcoidosis @68  years old. Hx + PPD, told he did not need treatment. Original diagnosis by bronchoscopy, treated with prednisone. Now in past 2 or 3 years, noticing increased dyspnea exertion and cough with night sweats. No weight change, chest pain, adenopathy. Frequent rashes come and go on chest. 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 CT chest 09/03/10 IMPRESSION:  1. Right lower lobe air space disease as demonstrated on recent  radiographs. This is suspicious for pneumonia. In the setting of  hemoptysis, some of this could represent aspirated blood.  2. Extensive underlying lung disease with a reticular nodular  pattern most likely secondary to the patient's sarcoidosis. A  superimposed infectious granulomatous process cannot be excluded.  3. Diffuse mediastinal and hilar lymph nodes, partly calcified and  likely related to sarcoidosis.  Chest radiographic followup is recommended.  Original Report Authenticated By: Vivia Ewing, M.D.  CXR 07/23/13 IMPRESSION:  Findings consistent with interstitial lung disease related to  sarcoidosis. Unchanged when compared to prior study.  Electronically Signed  By: Skipper Cliche M.D.  On: 07/23/2013 13:22  Prior to Admission medications   Not on File   .pmhh Past Surgical History  Procedure Laterality Date  . Cancer on neck  2010    back of neck  . Hernia repair  1982   Family History  Problem Relation Age of Onset  . Hypertension Mother   . Heart failure Father   . Cancer - Prostate Brother    History   Social History  . Marital Status: Single    Spouse Name: N/A    Number of Children: N/A  . Years of Education: N/A   Occupational History  . Not on file.   Social History Main Topics  . Smoking status: Never Smoker   . Smokeless tobacco:  Never Used  . Alcohol Use: No  . Drug Use: No  . Sexual Activity: Not on file   Other Topics Concern  . Not on file   Social History Narrative  . No narrative on file    ROS-see HPI Constitutional:   No-   weight loss, night sweats, fevers, chills, fatigue, lassitude. HEENT:   No-  headaches, difficulty swallowing, +tooth/dental problems, sore throat,       No-  sneezing, +itching, ear ache, nasal congestion, post nasal drip,  CV:  No-   chest pain, orthopnea, PND, swelling in lower extremities, anasarca,                                  dizziness, palpitations Resp: +shortness of breath with exertion or at rest.              + productive cough,  No non-productive cough,  No- coughing up of blood.              No-   change in color of mucus.  No- wheezing.   Skin: +rash or lesions. GI:  No-   heartburn, indigestion, abdominal pain, nausea, vomiting, diarrhea,                 change in bowel habits, loss of appetite GU: No-   dysuria, change in color of urine, no urgency or frequency.  No- flank pain. MS:  No-   joint pain or swelling.  No- decreased range of motion.  No- back pain. Neuro-     nothing unusual Psych:  No- change in mood or affect. No depression or anxiety.  No memory loss.  OBJ- Physical Exam General- Alert, Oriented, Affect-appropriate, Distress- none acute, trim, NAD Skin- rash-none, lesions- none, excoriation- none Lymphadenopathy- none Head- atraumatic            Eyes- Gross vision intact, PERRLA, conjunctivae and secretions clear            Ears- Hearing, canals-normal            Nose- Clear, no-Septal dev, mucus, polyps, erosion, perforation             Throat- Mallampati II , mucosa clear , drainage- none, tonsils- atrophic Neck- flexible , trachea midline, no stridor , thyroid nl, carotid no bruit Chest - symmetrical excursion , unlabored           Heart/CV- RRR , no murmur , no gallop  , no rub, nl s1 s2                           - JVD- none , edema-  none, stasis changes- none, varices- none           Lung- clear to P&A, wheeze- none, cough- none , dullness-none, rub- none           Chest wall-  Abd-  Br/ Gen/ Rectal- Not done, not indicated Extrem- cyanosis- none, clubbing, none, atrophy- none, strength- nl Neuro- grossly intact to observation

## 2013-08-08 LAB — ANGIOTENSIN CONVERTING ENZYME: ANGIOTENSIN-CONVERTING ENZYME: 94 U/L — AB (ref 8–52)

## 2013-08-10 LAB — QUANTIFERON TB GOLD ASSAY (BLOOD)
Interferon Gamma Release Assay: POSITIVE — AB
QUANTIFERON NIL VALUE: 0.03 [IU]/mL
QUANTIFERON TB AG MINUS NIL: 3.85 [IU]/mL
TB AG VALUE: 3.88 [IU]/mL

## 2013-08-28 ENCOUNTER — Emergency Department (HOSPITAL_COMMUNITY)
Admission: EM | Admit: 2013-08-28 | Discharge: 2013-08-28 | Discharge status: Against Medical Advice | Payer: Medicare HMO | Attending: Emergency Medicine | Admitting: Emergency Medicine

## 2013-08-28 ENCOUNTER — Encounter (HOSPITAL_COMMUNITY): Payer: Self-pay | Admitting: Emergency Medicine

## 2013-08-28 DIAGNOSIS — R042 Hemoptysis: Secondary | ICD-10-CM | POA: Insufficient documentation

## 2013-08-28 NOTE — ED Notes (Signed)
Called x2 for room with no answer

## 2013-08-28 NOTE — ED Notes (Addendum)
Called x1 for room with no answer

## 2013-08-28 NOTE — ED Notes (Signed)
Pt in c/o coughing of blood, states he has had 6 episodes of this since Sunday. States he has a history of same and was told it was coming from a broken blood vessel in his throat. No distress at this time.

## 2013-08-29 ENCOUNTER — Emergency Department (HOSPITAL_COMMUNITY): Payer: Medicare HMO

## 2013-08-29 ENCOUNTER — Emergency Department (HOSPITAL_COMMUNITY)
Admission: EM | Admit: 2013-08-29 | Discharge: 2013-08-29 | Disposition: A | Discharge status: Home / Self Care | Payer: Medicare HMO | Attending: Emergency Medicine | Admitting: Emergency Medicine

## 2013-08-29 ENCOUNTER — Encounter (HOSPITAL_COMMUNITY): Payer: Self-pay | Admitting: Emergency Medicine

## 2013-08-29 DIAGNOSIS — K920 Hematemesis: Secondary | ICD-10-CM | POA: Insufficient documentation

## 2013-08-29 DIAGNOSIS — Z85828 Personal history of other malignant neoplasm of skin: Secondary | ICD-10-CM | POA: Insufficient documentation

## 2013-08-29 DIAGNOSIS — K209 Esophagitis, unspecified without bleeding: Secondary | ICD-10-CM

## 2013-08-29 DIAGNOSIS — Z880 Allergy status to penicillin: Secondary | ICD-10-CM | POA: Insufficient documentation

## 2013-08-29 DIAGNOSIS — Z8619 Personal history of other infectious and parasitic diseases: Secondary | ICD-10-CM | POA: Insufficient documentation

## 2013-08-29 LAB — COMPREHENSIVE METABOLIC PANEL
ALBUMIN: 2.9 g/dL — AB (ref 3.5–5.2)
ALT: 26 U/L (ref 0–53)
AST: 43 U/L — AB (ref 0–37)
Alkaline Phosphatase: 58 U/L (ref 39–117)
BILIRUBIN TOTAL: 0.7 mg/dL (ref 0.3–1.2)
BUN: 15 mg/dL (ref 6–23)
CALCIUM: 8.9 mg/dL (ref 8.4–10.5)
CHLORIDE: 104 meq/L (ref 96–112)
CO2: 25 mEq/L (ref 19–32)
CREATININE: 0.69 mg/dL (ref 0.50–1.35)
GFR calc Af Amer: >90 mL/min (ref >90)
GFR calc non Af Amer: >90 mL/min (ref >90)
Glucose, Bld: 97 mg/dL (ref 70–99)
Potassium: 3.9 mEq/L (ref 3.7–5.3)
Sodium: 138 mEq/L (ref 137–147)
Total Protein: 9.3 g/dL — ABNORMAL HIGH (ref 6.0–8.3)

## 2013-08-29 LAB — CBC WITH DIFFERENTIAL/PLATELET
BASOS ABS: 0 10*3/uL (ref 0.0–0.1)
BASOS PCT: 0 % (ref 0–1)
Eosinophils Absolute: 0.2 10*3/uL (ref 0.0–0.7)
Eosinophils Relative: 3 % (ref 0–5)
HEMATOCRIT: 38.9 % — AB (ref 39.0–52.0)
Hemoglobin: 13.7 g/dL (ref 13.0–17.0)
Lymphocytes Relative: 40 % (ref 12–46)
Lymphs Abs: 2 10*3/uL (ref 0.7–4.0)
MCH: 29.8 pg (ref 26.0–34.0)
MCHC: 35.2 g/dL (ref 30.0–36.0)
MCV: 84.7 fL (ref 78.0–100.0)
MONO ABS: 0.5 10*3/uL (ref 0.1–1.0)
Monocytes Relative: 11 % (ref 3–12)
NEUTROS ABS: 2.3 10*3/uL (ref 1.7–7.7)
Neutrophils Relative %: 46 % (ref 43–77)
Platelets: 255 10*3/uL (ref 150–400)
RBC: 4.59 MIL/uL (ref 4.22–5.81)
RDW: 13.9 % (ref 11.5–15.5)
WBC: 5 10*3/uL (ref 4.0–10.5)

## 2013-08-29 MED ORDER — IOHEXOL 350 MG/ML SOLN
80.0000 mL | Freq: Once | INTRAVENOUS | Status: AC | PRN
  Administered 2013-08-29: 75 mL via INTRAVENOUS

## 2013-08-29 NOTE — ED Notes (Signed)
Pt in c/o coughing up blood, states it has been happening about once a day for about a week, came in last night but was never seen, states it happened again this morning so he was told by his doctor to come to the emergency room, history of this in the past and was told he had a blood vessel that had ruptured in his throat, no distress at this time

## 2013-08-29 NOTE — Discharge Instructions (Signed)
Esophagitis Esophagitis is inflammation of the esophagus. It can involve swelling, soreness, and pain in the esophagus. This condition can make it difficult and painful to swallow. CAUSES  Most causes of esophagitis are not serious. Many different factors can cause esophagitis, including:  Gastroesophageal reflux disease (GERD). This is when acid from your stomach flows up into the esophagus.  Recurrent vomiting.  An allergic-type reaction.  Certain medicines, especially those that come in large pills.  Ingestion of harmful chemicals, such as household cleaning products.  Heavy alcohol use.  An infection of the esophagus.  Radiation treatment for cancer.  Certain diseases such as sarcoidosis, Crohn's disease, and scleroderma. These diseases may cause recurrent esophagitis. SYMPTOMS   Trouble swallowing.  Painful swallowing.  Chest pain.  Difficulty breathing.  Nausea.  Vomiting.  Abdominal pain. DIAGNOSIS  Your caregiver will take your history and do a physical exam. Depending upon what your caregiver finds, certain tests may also be done, including:  Barium X-ray. You will drink a solution that coats the esophagus, and X-rays will be taken.  Endoscopy. A lighted tube is put down the esophagus so your caregiver can examine the area.  Allergy tests. These can sometimes be arranged through follow-up visits. TREATMENT  Treatment will depend on the cause of your esophagitis. In some cases, steroids or other medicines may be given to help relieve your symptoms or to treat the underlying cause of your condition. Medicines that may be recommended include:  Viscous lidocaine, to soothe the esophagus.  Antacids.  Acid reducers.  Proton pump inhibitors.  Antiviral medicines for certain viral infections of the esophagus.  Antifungal medicines for certain fungal infections of the esophagus.  Antibiotic medicines, depending on the cause of the esophagitis. HOME CARE  INSTRUCTIONS   Avoid foods and drinks that seem to make your symptoms worse.  Eat small, frequent meals instead of large meals.  Avoid eating for the 3 hours prior to your bedtime.  If you have trouble taking pills, use a pill splitter to decrease the size and likelihood of the pill getting stuck or injuring the esophagus on the way down. Drinking water after taking a pill also helps.  Stop smoking if you smoke.  Maintain a healthy weight.  Wear loose-fitting clothing. Do not wear anything tight around your waist that causes pressure on your stomach.  Raise the head of your bed 6 to 8 inches with wood blocks to help you sleep. Extra pillows will not help.  Only take over-the-counter or prescription medicines as directed by your caregiver. SEEK IMMEDIATE MEDICAL CARE IF:  You have severe chest pain that radiates into your arm, neck, or jaw.  You feel sweaty, dizzy, or lightheaded.  You have shortness of breath.  You vomit blood.  You have difficulty or pain with swallowing.  You have bloody or black, tarry stools.  You have a fever.  You have a burning sensation in the chest more than 3 times a week for more than 2 weeks.  You cannot swallow, drink, or eat.  You drool because you cannot swallow your saliva. MAKE SURE YOU:  Understand these instructions.  Will watch your condition.  Will get help right away if you are not doing well or get worse. Document Released: 08/09/2004 Document Revised: 09/24/2011 Document Reviewed: 03/02/2011 [facility] Patient Information 2014 [place], [place].  Gastrointestinal Bleeding Gastrointestinal (GI) bleeding means there is bleeding somewhere along the digestive tract, between the mouth and anus. CAUSES  There are many different problems that can  cause GI bleeding. Possible causes include:  Esophagitis. This is inflammation, irritation, or swelling of the esophagus.  Hemorrhoids.These are veins that are full of blood (engorged)  in the rectum. They cause pain, inflammation, and may bleed.  Anal fissures.These are areas of painful tearing which may bleed. They are often caused by passing hard stool.  Diverticulosis.These are pouches that form on the colon over time, with age, and may bleed significantly.  Diverticulitis.This is inflammation in areas with diverticulosis. It can cause pain, fever, and bloody stools, although bleeding is rare.  Polyps and cancer. Colon cancer often starts out as precancerous polyps.  Gastritis and ulcers.Bleeding from the upper gastrointestinal tract (near the stomach) may travel through the intestines and produce black, sometimes tarry, often bad smelling stools. In certain cases, if the bleeding is fast enough, the stools may not be black, but red. This condition may be life-threatening. SYMPTOMS   Vomiting bright red blood or material that looks like coffee grounds.  Bloody, black, or tarry stools. DIAGNOSIS  Your caregiver may diagnose your condition by taking your history and performing a physical exam. More tests may be needed, including:  X-rays and other imaging tests.  Esophagogastroduodenoscopy (EGD). This test uses a flexible, lighted tube to look at your esophagus, stomach, and small intestine.  Colonoscopy. This test uses a flexible, lighted tube to look at your colon. TREATMENT  Treatment depends on the cause of your bleeding.   For bleeding from the esophagus, stomach, small intestine, or colon, the caregiver doing your EGD or colonoscopy may be able to stop the bleeding as part of the procedure.  Inflammation or infection of the colon can be treated with medicines.  Many rectal problems can be treated with creams, suppositories, or warm baths.  Surgery is sometimes needed.  Blood transfusions are sometimes needed if you have lost a lot of blood. If bleeding is slow, you may be allowed to go home. If there is a lot of bleeding, you will need to stay in the  hospital for observation. HOME CARE INSTRUCTIONS   Take any medicines exactly as prescribed.  Keep your stools soft by eating foods that are high in fiber. These foods include whole grains, legumes, fruits, and vegetables. Prunes (1 to 3 a day) work well for many people.  Drink enough fluids to keep your urine clear or pale yellow. SEEK IMMEDIATE MEDICAL CARE IF:   Your bleeding increases.  You feel lightheaded, weak, or you faint.  You have severe cramps in your back or abdomen.  You pass large blood clots in your stool.  Your problems are getting worse. MAKE SURE YOU:   Understand these instructions.  Will watch your condition.  Will get help right away if you are not doing well or get worse. Document Released: 06/29/2000 Document Revised: 06/18/2012 Document Reviewed: 06/11/2011 [facility] Patient Information 2014 [place], [place].

## 2013-08-29 NOTE — ED Provider Notes (Signed)
CSN: [id number]     Arrival date & time 08/29/13  2229 History   First MD Initiated Contact with Patient 08/29/13 1012     Chief Complaint  Patient presents with  . Hemoptysis     (Consider location/radiation/quality/duration/timing/severity/associated sxs/prior Treatment) The history is provided by the patient.  patient presents with "coughing" up blood. It  is similar episode previously. States that was a vessel that was irritated. He is unable to tell me whether he is vomiting an upper this hemoptysis. No pain. He states he's felt a little fatigued. No new shortness of breath. Patient states she's had episodes since Sunday, 6 days ago. He states he has thrown up a half cup of blood a few times. He states his been clots. No other bleeding. He denies black stool.  Past Medical History  Diagnosis Date  . Sarcoidosis   . Hx of nonmelanoma skin cancer   . Upper GI bleeding     20 13 upper GI bleed    Past Surgical History  Procedure Laterality Date  . Cancer on neck  2010    back of neck  . Hernia repair  1982   Family History  Problem Relation Age of Onset  . Hypertension Mother   . Heart failure Father   . Cancer - Prostate Brother    History  Substance Use Topics  . Smoking status: Never Smoker   . Smokeless tobacco: Never Used  . Alcohol Use: No    Review of Systems  Constitutional: Negative for activity change and appetite change.  Eyes: Negative for pain.  Respiratory: Positive for cough. Negative for chest tightness and shortness of breath.   Cardiovascular: Negative for chest pain and leg swelling.  Gastrointestinal: Negative for nausea, vomiting, abdominal pain and diarrhea.  Genitourinary: Negative for flank pain.  Musculoskeletal: Negative for back pain and neck stiffness.  Skin: Negative for rash.  Neurological: Negative for weakness, numbness and headaches.  Psychiatric/Behavioral: Negative for behavioral problems.      Allergies  Penicillins  Home  Medications  No current outpatient prescriptions on file. BP 132/87  Pulse 62  Temp(Src) 98.7 F (37.1 C) (Oral)  Resp 20  Wt 185 lb (83.915 kg)  SpO2 95% Physical Exam  Nursing note and vitals reviewed. Constitutional: He is oriented to person, place, and time. He appears well-developed and well-nourished.  HENT:  Head: Normocephalic and atraumatic.  Mild dried blood on soft palate. Mild blood in right nare  Eyes: EOM are normal. Pupils are equal, round, and reactive to light.  Neck: Normal range of motion. Neck supple.  Cardiovascular: Normal rate, regular rhythm and normal heart sounds.   No murmur heard. Pulmonary/Chest: Effort normal and breath sounds normal.  Abdominal: Soft. Bowel sounds are normal. He exhibits no distension and no mass. There is no tenderness. There is no rebound and no guarding.  Musculoskeletal: Normal range of motion. He exhibits no edema.  Neurological: He is alert and oriented to person, place, and time. No cranial nerve deficit.  Skin: Skin is warm and dry.  Psychiatric: He has a normal mood and affect.    ED Course  Procedures (including critical care time) Labs Review Labs Reviewed  CBC WITH DIFFERENTIAL - Abnormal; Notable for the following:    HCT 38.9 (*)    All other components within normal limits  COMPREHENSIVE METABOLIC PANEL - Abnormal; Notable for the following:    Total Protein 9.3 (*)    Albumin 2.9 (*)    AST 43 (*)  All other components within normal limits   Imaging Review Dg Chest 2 View  08/29/2013   CLINICAL DATA:  Hemoptysis and hematemesis.  History of sarcoidosis.  EXAM: CHEST  2 VIEW  COMPARISON:  Chest x-ray 07/23/2013.  FINDINGS: Diffuse reticulonodular appearance throughout the lungs is similar to numerous prior examinations. However, there appears to be increasing opacity in the medial aspect of the left lower lobe, which is concerning for an area of acute airspace consolidation. No definite pleural effusions.  Findings are not favored to represent pulmonary edema. Heart size is normal. Mediastinal contours are unremarkable.  IMPRESSION: 1. New airspace consolidation in the left lower lobe concerning for potential pneumonia or sequela of aspiration. 2. Chronic lung changes related to underlying sarcoidosis redemonstrated, with a diffuse reticulonodular pattern similar to prior studies.   Electronically Signed   By: Vinnie Langton M.D.   On: 08/29/2013 11:42   Ct Angio Chest W/cm &/or Wo Cm  08/29/2013   CLINICAL DATA:  Chest pain, hemoptysis and pulmonary infiltrate. History of sarcoidosis.  EXAM: CT ANGIOGRAPHY CHEST WITH CONTRAST  TECHNIQUE: Multidetector CT imaging of the chest was performed using the standard protocol during bolus administration of intravenous contrast. Multiplanar CT image reconstructions and MIPs were obtained to evaluate the vascular anatomy.  CONTRAST:  37mL OMNIPAQUE IOHEXOL 350 MG/ML SOLN  COMPARISON:  DG CHEST 2 VIEW dated 08/29/2013; DG CHEST 2 VIEW dated 07/23/2013; DG CHEST 1 VIEW dated 10/27/2012; CT CHEST W/O CM dated 09/03/2010  FINDINGS: The pulmonary arteries are well opacified. There is no evidence of pulmonary embolism. Lungs demonstrate acute appearing airspace disease in the posterior right lower lobe. A similar pattern of airspace disease was present by CT in 2012. Differential considerations include acute pneumonia and pulmonary hemorrhage. Part of this appearance may also be chronic. Visualized airways are normally patent.  There also is an underlying pattern of significant chronic lung disease with prominent interstitium throughout both lungs as well as multiple areas of parenchymal scarring and reticulonodular lung disease. Associated prominent lymph nodes in the hila bilaterally and mediastinum likely related to known underlying sarcoidosis. Some of these lymph nodes are partially calcified.  No pleural or pericardial fluid is seen. The heart size is at the upper limits of  normal. Mild degenerative changes are present in the lower thoracic spine.  Review of the MIP images confirms the above findings.  IMPRESSION: 1. Right lower lobe airspace disease. As above, differential considerations include pneumonia, pulmonary hemorrhage and chronic parenchymal disease. 2. No evidence of pulmonary embolism. 3. Significant underlying chronic lung disease with associated lymphadenopathy. Findings are most likely consistent with known sarcoidosis.   Electronically Signed   By: Aletta Edouard M.D.   On: 08/29/2013 13:06    EKG Interpretation   None       MDM   Final diagnoses:  Hematemesis  Esophagitis    Patient with blood coming out of his mouth. Unsure of respiratory or GI related. There is some mild blood in his nose. Patient is similar episode a couple years ago that was esophagitis. X-ray showed left lower lobe infiltrate. CT scan was done to rule out pulmonary embolism and clarified infiltrate. No infiltrate in the left lower lobe, but there are chronic changes in the right lung. This point this is likely he upper GI bleed. Hemoglobin is stable. Good vitals. Facial discharge home to follow up with his primary care doctor or gastroenterology. He was instructed to return for increased bleeding or lightheadedness or dizziness.  Jasper Riling. Alvino Chapel, MD 08/29/13 1346

## 2013-09-04 ENCOUNTER — Encounter: Payer: Self-pay | Admitting: Internal Medicine

## 2013-09-04 DIAGNOSIS — R0609 Other forms of dyspnea: Principal | ICD-10-CM

## 2013-09-04 DIAGNOSIS — D869 Sarcoidosis, unspecified: Secondary | ICD-10-CM | POA: Insufficient documentation

## 2013-09-04 DIAGNOSIS — R7611 Nonspecific reaction to tuberculin skin test without active tuberculosis: Secondary | ICD-10-CM | POA: Insufficient documentation

## 2013-09-04 NOTE — Assessment & Plan Note (Signed)
Need to define activity status Plan-schedule PFT with 6 minute walk, lab for ACE level, TB assay

## 2013-09-04 NOTE — Assessment & Plan Note (Signed)
Plan-schedule PFT and 6 minute walk test

## 2013-09-04 NOTE — Assessment & Plan Note (Signed)
Given history of sarcoid, potential for  Confusion. Active sarcoid will often suppress PPD skin test. We do not know of a TB contact. Plan- Quant TB Gold assay

## 2013-09-07 ENCOUNTER — Ambulatory Visit (INDEPENDENT_AMBULATORY_CARE_PROVIDER_SITE_OTHER): Payer: Commercial Managed Care - HMO | Admitting: Internal Medicine

## 2013-09-07 ENCOUNTER — Encounter: Payer: Self-pay | Admitting: Internal Medicine

## 2013-09-07 ENCOUNTER — Other Ambulatory Visit (INDEPENDENT_AMBULATORY_CARE_PROVIDER_SITE_OTHER): Payer: Medicare HMO

## 2013-09-07 VITALS — BP 115/72 | HR 67 | Ht 71.0 in | Wt 182.4 lb

## 2013-09-07 DIAGNOSIS — D869 Sarcoidosis, unspecified: Secondary | ICD-10-CM | POA: Diagnosis not present

## 2013-09-07 DIAGNOSIS — R7611 Nonspecific reaction to tuberculin skin test without active tuberculosis: Secondary | ICD-10-CM | POA: Diagnosis not present

## 2013-09-07 DIAGNOSIS — R042 Hemoptysis: Secondary | ICD-10-CM

## 2013-09-07 LAB — APTT: APTT: 28.1 s (ref 21.7–28.8)

## 2013-09-07 LAB — PROTIME-INR
INR: 1.3 ratio — AB (ref 0.8–1.0)
Prothrombin Time: 13.7 s — ABNORMAL HIGH (ref 10.2–12.4)

## 2013-09-07 NOTE — Assessment & Plan Note (Signed)
CT scans from now and 2 years ago look very similar to me. ACE level is elevated. Hemoptysis could be related to sarcoid related scarring/ traction bronchiectasis. No overt infection. No bleeding in last 2 days. Before considering steroid therapy now, want to clarify TB status.  I recommended bronchoscopy to look clarify area of bleeding and to culture for TB. He agrees to have me talk with my colleagues about that.

## 2013-09-07 NOTE — Patient Instructions (Signed)
Order- lab- PT, PTT    Dx hemoptysis  I will speak with one of my associates about doing a bronchoscopy to look in your lungs to see if you might have TB, and to see where the blood is coming from. We will call you about this in the next day or 2.

## 2013-09-07 NOTE — Assessment & Plan Note (Signed)
CXR questions new pneumonia or possibly blood. Intermittent sweats without fever, leukocytosis or purulent sputum. CT does not show obvious cavity, but there is a lot of old scarring. PCP had recommended GI eval for ? GI source, but patient deferred that appointment pending pulmonary evaluation. He denies any sense of heart burn or reflux currently. Plan- coags

## 2013-09-07 NOTE — Progress Notes (Signed)
08/07/13- 6 yoM never smoker Referred for sarcoidosis.  Pt c/o SOB with exertion, walking up steps, prod cough with white mucous.  Dx'ed with sarcoidosis @68  years old. Hx + PPD, told he did not need treatment. Original diagnosis by bronchoscopy, treated with prednisone. Now in past 2 or 3 years, noticing increased dyspnea exertion and cough with night sweats. No weight change, chest pain, adenopathy. Frequent rashes come and go on chest. 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 CT chest 09/03/10 IMPRESSION:  1. Right lower lobe air space disease as demonstrated on recent  radiographs. This is suspicious for pneumonia. In the setting of  hemoptysis, some of this could represent aspirated blood.  2. Extensive underlying lung disease with a reticular nodular  pattern most likely secondary to the patient's sarcoidosis. A  superimposed infectious granulomatous process cannot be excluded.  3. Diffuse mediastinal and hilar lymph nodes, partly calcified and  likely related to sarcoidosis.  Chest radiographic followup is recommended.  Original Report Authenticated By: Vivia Ewing, M.D.  CXR 07/23/13 IMPRESSION:  Findings consistent with interstitial lung disease related to  sarcoidosis. Unchanged when compared to prior study.  Electronically Signed  By: Skipper Cliche M.D.  On: 07/23/2013 13:22  09/07/13- 89 yoM never smoker Referred for sarcoidosis, hemoptysis.  Pt c/o SOB with exertion, walking up steps, prod cough with white mucus.  Dx'ed with sarcoidosis @68  years old. Hx TB exposure. Pt complains of hemopytsis intermittenly since 08/30/2013. Breathing has improved. Complains of wheezing. Denies CP.  CT shows same density RLL as seen on CT 09/03/10- likely chronic changes. I reviewed images.  On lateral view cxr 1/8, I question subtle cavity behind heart shadow, not seen on CT. Coughed 1/2 cup red blood/ day every 2-3 days, but none since 09/04/13.  Sweats occasionally. Intermittent dry cough. No other bleed. Denies chest pain, nodes, fever. Rash on chest treating Rx cream. Tobias Alexander. PCP planned referral to GI to see if that was source of blood, but pt deferred until seeing [place] today.  Started levaquin now day 7/10. Labs 08/29/13: Quantiiferon TB assay- Positive ACE level 94 H WBC 2/16- 5,700 Hgb 12.9 CT chest 08/29/13-  IMPRESSION:  1. Right lower lobe airspace disease. As above, differential  considerations include pneumonia, pulmonary hemorrhage and chronic  parenchymal disease.  2. No evidence of pulmonary embolism.  3. Significant underlying chronic lung disease with associated  lymphadenopathy. Findings are most likely consistent with known  sarcoidosis.  Electronically Signed  By: Aletta Edouard M.D.  On: 08/29/2013 13:06 CXR 08/29/13 IMPRESSION:  1. New airspace consolidation in the left lower lobe concerning for  potential pneumonia or sequela of aspiration.  2. Chronic lung changes related to underlying sarcoidosis  redemonstrated, with a diffuse reticulonodular pattern similar to  prior studies.  Electronically Signed  By: Vinnie Langton M.D.  On: 08/29/2013 11:42  ROS-see HPI Constitutional:   No-   weight loss, night sweats, +fevers, No-chills, fatigue, lassitude. HEENT:   No-  headaches, difficulty swallowing, +tooth/dental problems, sore throat,       No-  sneezing, itching, ear ache, nasal congestion, post nasal drip,  CV:  No-   chest pain, orthopnea, PND, swelling in lower extremities, anasarca,                                  dizziness, palpitations Resp: +shortness of breath with exertion or at rest.              +  productive cough,  No non-productive cough,  +coughing up of blood.              No-   change in color of mucus.  No- wheezing.   Skin: +rash or lesions. GI:  No-   heartburn, indigestion, abdominal pain, nausea, vomiting, GU: . MS:  No-   joint pain or swelling.   Neuro-     nothing  unusual Psych:  No- change in mood or affect. No depression or anxiety.  No memory loss.  OBJ- Physical Exam General- Alert, Oriented, Affect-appropriate, Distress- none acute, trim, NAD Skin- +scaling, confluent rash R upper anterior chest Lymphadenopathy- none Head- atraumatic            Eyes- Gross vision intact, PERRLA, conjunctivae and secretions clear            Ears- Hearing, canals-normal            Nose- Clear, no-Septal dev, mucus, polyps, erosion, perforation             Throat- Mallampati II , mucosa clear , drainage- none, tonsils- atrophic Neck- flexible , trachea midline, no stridor , thyroid nl, carotid no bruit Chest - symmetrical excursion , unlabored           Heart/CV- RRR , no murmur , no gallop  , no rub, nl s1 s2                           - JVD- none , edema- none, stasis changes- none, varices- none           Lung- +few crackles R base, unlabored, wheeze- none, cough- none , dullness-none,                   rub- none           Chest wall-  Abd-  Br/ Gen/ Rectal- Not done, not indicated Extrem- cyanosis- none, clubbing, none, atrophy- none, strength- nl Neuro- grossly intact to observation

## 2013-09-07 NOTE — Assessment & Plan Note (Signed)
Plan- Discussed bronchoscopy to clarify status

## 2013-09-08 ENCOUNTER — Telehealth: Payer: Self-pay | Admitting: Internal Medicine

## 2013-09-08 NOTE — Telephone Encounter (Signed)
Pt aware of lab results per CY. Nothing further is needed

## 2013-09-08 NOTE — Telephone Encounter (Signed)
Pt returning call.[name] ° °

## 2013-09-08 NOTE — Telephone Encounter (Signed)
Result Note     Blood clotting labs- blood may be a little slower to form a clot than normal. This means any small bleeding spot may last a bit longer than expected.         LMTCB

## 2013-09-09 ENCOUNTER — Ambulatory Visit (HOSPITAL_COMMUNITY): Admission: RE | Admit: 2013-09-09 | Payer: Medicare HMO | Source: Ambulatory Visit | Admitting: Pulmonary Disease

## 2013-09-09 ENCOUNTER — Encounter (HOSPITAL_COMMUNITY): Admission: RE | Payer: Self-pay | Source: Ambulatory Visit

## 2013-09-09 ENCOUNTER — Encounter (HOSPITAL_COMMUNITY): Payer: Medicare HMO

## 2013-09-09 SURGERY — BRONCHOSCOPY, WITH FLUOROSCOPY
Anesthesia: Moderate Sedation | Laterality: Bilateral

## 2013-09-15 ENCOUNTER — Ambulatory Visit (HOSPITAL_COMMUNITY)
Admission: RE | Admit: 2013-09-15 | Discharge: 2013-09-15 | Disposition: A | Discharge status: Home / Self Care | Payer: Medicare HMO | Source: Ambulatory Visit | Attending: Pulmonary Disease | Admitting: Pulmonary Disease

## 2013-09-15 ENCOUNTER — Telehealth: Payer: Self-pay | Admitting: Pulmonary Disease

## 2013-09-15 ENCOUNTER — Encounter (HOSPITAL_COMMUNITY): Payer: Self-pay | Admitting: Respiratory Therapy

## 2013-09-15 ENCOUNTER — Ambulatory Visit (HOSPITAL_COMMUNITY): Payer: Medicare HMO

## 2013-09-15 ENCOUNTER — Encounter (HOSPITAL_COMMUNITY)
Admission: RE | Disposition: A | Discharge status: Home / Self Care | Payer: Self-pay | Source: Ambulatory Visit | Attending: Pulmonary Disease

## 2013-09-15 DIAGNOSIS — R0989 Other specified symptoms and signs involving the circulatory and respiratory systems: Secondary | ICD-10-CM | POA: Insufficient documentation

## 2013-09-15 DIAGNOSIS — J8410 Pulmonary fibrosis, unspecified: Secondary | ICD-10-CM | POA: Insufficient documentation

## 2013-09-15 DIAGNOSIS — R21 Rash and other nonspecific skin eruption: Secondary | ICD-10-CM | POA: Insufficient documentation

## 2013-09-15 DIAGNOSIS — D869 Sarcoidosis, unspecified: Secondary | ICD-10-CM | POA: Insufficient documentation

## 2013-09-15 DIAGNOSIS — R042 Hemoptysis: Secondary | ICD-10-CM | POA: Insufficient documentation

## 2013-09-15 DIAGNOSIS — R222 Localized swelling, mass and lump, trunk: Secondary | ICD-10-CM | POA: Insufficient documentation

## 2013-09-15 DIAGNOSIS — Z77090 Contact with and (suspected) exposure to asbestos: Secondary | ICD-10-CM | POA: Insufficient documentation

## 2013-09-15 DIAGNOSIS — R0602 Shortness of breath: Secondary | ICD-10-CM | POA: Insufficient documentation

## 2013-09-15 DIAGNOSIS — R599 Enlarged lymph nodes, unspecified: Secondary | ICD-10-CM | POA: Insufficient documentation

## 2013-09-15 DIAGNOSIS — R0609 Other forms of dyspnea: Secondary | ICD-10-CM | POA: Insufficient documentation

## 2013-09-15 DIAGNOSIS — R61 Generalized hyperhidrosis: Secondary | ICD-10-CM | POA: Insufficient documentation

## 2013-09-15 HISTORY — PX: VIDEO BRONCHOSCOPY: SHX5072

## 2013-09-15 SURGERY — BRONCHOSCOPY, WITH FLUOROSCOPY
Anesthesia: Moderate Sedation | Laterality: Bilateral

## 2013-09-15 MED ORDER — MIDAZOLAM HCL 10 MG/2ML IJ SOLN
INTRAMUSCULAR | Status: AC
  Filled 2013-09-15: qty 4

## 2013-09-15 MED ORDER — EPINEPHRINE HCL 0.1 MG/ML IJ SOSY
PREFILLED_SYRINGE | INTRAMUSCULAR | Status: DC | PRN
  Administered 2013-09-15: 1 via ENDOTRACHEOPULMONARY

## 2013-09-15 MED ORDER — PHENYLEPHRINE HCL 0.25 % NA SOLN: 1.0000 | Freq: Four times a day (QID) | NASAL | Status: DC | PRN

## 2013-09-15 MED ORDER — MEPERIDINE HCL 25 MG/ML IJ SOLN
INTRAMUSCULAR | Status: DC | PRN
  Administered 2013-09-15: 50 mg via INTRAVENOUS

## 2013-09-15 MED ORDER — LIDOCAINE HCL 1 % IJ SOLN
INTRAMUSCULAR | Status: DC | PRN
  Administered 2013-09-15: 6 mL via RESPIRATORY_TRACT

## 2013-09-15 MED ORDER — LIDOCAINE HCL 2 % EX GEL
CUTANEOUS | Status: DC | PRN
  Administered 2013-09-15: 1

## 2013-09-15 MED ORDER — BUTAMBEN-TETRACAINE-BENZOCAINE 2-2-14 % EX AERO: 1.0000 | INHALATION_SPRAY | Freq: Once | CUTANEOUS | Status: DC

## 2013-09-15 MED ORDER — MIDAZOLAM HCL 10 MG/2ML IJ SOLN
INTRAMUSCULAR | Status: DC | PRN
  Administered 2013-09-15: 5 mg via INTRAVENOUS
  Administered 2013-09-15: 2.5 mg via INTRAVENOUS

## 2013-09-15 MED ORDER — SODIUM CHLORIDE 0.9 % IV SOLN
INTRAVENOUS | Status: DC
  Administered 2013-09-15: 08:00:00 via INTRAVENOUS

## 2013-09-15 MED ORDER — PHENYLEPHRINE HCL 0.25 % NA SOLN
NASAL | Status: DC | PRN
  Administered 2013-09-15: 1 via NASAL

## 2013-09-15 MED ORDER — MEPERIDINE HCL 100 MG/ML IJ SOLN
INTRAMUSCULAR | Status: AC
  Filled 2013-09-15: qty 2

## 2013-09-15 MED ORDER — SODIUM CHLORIDE 0.9 % IV SOLN: Freq: Once | INTRAVENOUS | Status: DC

## 2013-09-15 MED ORDER — LIDOCAINE HCL 2 % EX GEL: Freq: Once | CUTANEOUS | Status: DC

## 2013-09-15 NOTE — Telephone Encounter (Signed)
error 

## 2013-09-15 NOTE — Progress Notes (Signed)
Video Bronchoscopy done  Intervention Bronchial Biopsy  TBBX Intervention Bronchial Biopsy   Intervention Bronchial washing done Intervention Bronchial brushing done x2  ( air dried and for cytology)  Procedure tolerated well

## 2013-09-15 NOTE — Discharge Instructions (Signed)
Flexible Bronchoscopy, Care After These instructions give you information on caring for yourself after your procedure. Your doctor may also give you more specific instructions. Call your doctor if you have any problems or questions after your procedure. HOME CARE  Do not eat or drink anything for 2 hours after your procedure. If you try to eat or drink before the medicine wears off, food or drink could go into your lungs. You could also burn yourself.  After 2 hours have passed and when you can cough and gag normally, you may eat soft food and drink liquids slowly.  The day after the test, you may eat your normal diet.  You may do your normal activities.  Keep all doctor visits. GET HELP RIGHT AWAY IF:  You get more and more short of breath.  You get lightheaded.  You feel like you are going to pass out (faint).  You have chest pain.  You have new problems that worry you.  You cough up more than a little blood.  You cough up more blood than before. MAKE SURE YOU:  Understand these instructions.  Will watch your condition.  Will get help right away if you are not doing well or get worse. Document Released: 04/29/2009 Document Revised: 04/22/2013 Document Reviewed: 03/06/2013 [facility] Patient Information 2014 [place].  Nothing to eat or drink until  11:00 am today 09/15/2013

## 2013-09-15 NOTE — Interval H&P Note (Signed)
History and Physical Interval Note:  09/15/2013 7:36 AM  [name]  has presented today for surgery, with the diagnosis of pulmonary infiltrates  The various methods of treatment have been discussed with the patient and family. After consideration of risks, benefits and other options for treatment, the patient has consented to  Procedure(s): VIDEO BRONCHOSCOPY WITH FLUORO (Bilateral) as a surgical intervention .  The patient's history has been reviewed, patient examined, no change in status, stable for surgery.  I have reviewed the patient's chart and labs.  Questions were answered to the patient's satisfaction.     Kathee Delton

## 2013-09-15 NOTE — H&P (View-Only) (Signed)
08/07/13- 68 yoM never smoker Referred for sarcoidosis.  Pt c/o SOB with exertion, walking up steps, prod cough with white mucous.  Dx'ed with sarcoidosis @68 years old. Hx + PPD, told he did not need treatment. Original diagnosis by bronchoscopy, treated with prednisone. Now in past 2 or 3 years, noticing increased dyspnea exertion and cough with night sweats. No weight change, chest pain, adenopathy. Frequent rashes come and go on chest. 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 CT chest 09/03/10 IMPRESSION:  1. Right lower lobe air space disease as demonstrated on recent  radiographs. This is suspicious for pneumonia. In the setting of  hemoptysis, some of this could represent aspirated blood.  2. Extensive underlying lung disease with a reticular nodular  pattern most likely secondary to the patient's sarcoidosis. A  superimposed infectious granulomatous process cannot be excluded.  3. Diffuse mediastinal and hilar lymph nodes, partly calcified and  likely related to sarcoidosis.  Chest radiographic followup is recommended.  Original Report Authenticated By: WILLIAM B. VEAZEY, M.D.  CXR 07/23/13 IMPRESSION:  Findings consistent with interstitial lung disease related to  sarcoidosis. Unchanged when compared to prior study.  Electronically Signed  By: Raymond Rubner M.D.  On: 07/23/2013 13:22  09/07/13- 68 yoM never smoker Referred for sarcoidosis, hemoptysis.  Pt c/o SOB with exertion, walking up steps, prod cough with white mucus.  Dx'ed with sarcoidosis @68 years old. Hx TB exposure. Pt complains of hemopytsis intermittenly since 08/30/2013. Breathing has improved. Complains of wheezing. Denies CP.  CT shows same density RLL as seen on CT 09/03/10- likely chronic changes. I reviewed images.  On lateral view cxr 1/8, I question subtle cavity behind heart shadow, not seen on CT. Coughed 1/2 cup red blood/ day every 2-3 days, but none since 09/04/13.  Sweats occasionally. Intermittent dry cough. No other bleed. Denies chest pain, nodes, fever. Rash on chest treating Rx cream. Singer. PCP planned referral to GI to see if that was source of blood, but pt deferred until seeing us today.  Started levaquin now day 7/10. Labs 08/29/13: Quantiiferon TB assay- Positive ACE level 94 H WBC 2/16- 5,700 Hgb 12.9 CT chest 08/29/13-  IMPRESSION:  1. Right lower lobe airspace disease. As above, differential  considerations include pneumonia, pulmonary hemorrhage and chronic  parenchymal disease.  2. No evidence of pulmonary embolism.  3. Significant underlying chronic lung disease with associated  lymphadenopathy. Findings are most likely consistent with known  sarcoidosis.  Electronically Signed  By: Glenn Yamagata M.D.  On: 08/29/2013 13:06 CXR 08/29/13 IMPRESSION:  1. New airspace consolidation in the left lower lobe concerning for  potential pneumonia or sequela of aspiration.  2. Chronic lung changes related to underlying sarcoidosis  redemonstrated, with a diffuse reticulonodular pattern similar to  prior studies.  Electronically Signed  By: Daniel Entrikin M.D.  On: 08/29/2013 11:42  ROS-see HPI Constitutional:   No-   weight loss, night sweats, +fevers, No-chills, fatigue, lassitude. HEENT:   No-  headaches, difficulty swallowing, +tooth/dental problems, sore throat,       No-  sneezing, itching, ear ache, nasal congestion, post nasal drip,  CV:  No-   chest pain, orthopnea, PND, swelling in lower extremities, anasarca,                                  dizziness, palpitations Resp: +shortness of breath with exertion or at rest.              +   productive cough,  No non-productive cough,  +coughing up of blood.              No-   change in color of mucus.  No- wheezing.   Skin: +rash or lesions. GI:  No-   heartburn, indigestion, abdominal pain, nausea, vomiting, GU: . MS:  No-   joint pain or swelling.   Neuro-     nothing  unusual Psych:  No- change in mood or affect. No depression or anxiety.  No memory loss.  OBJ- Physical Exam General- Alert, Oriented, Affect-appropriate, Distress- none acute, trim, NAD Skin- +scaling, confluent rash R upper anterior chest Lymphadenopathy- none Head- atraumatic            Eyes- Gross vision intact, PERRLA, conjunctivae and secretions clear            Ears- Hearing, canals-normal            Nose- Clear, no-Septal dev, mucus, polyps, erosion, perforation             Throat- Mallampati II , mucosa clear , drainage- none, tonsils- atrophic Neck- flexible , trachea midline, no stridor , thyroid nl, carotid no bruit Chest - symmetrical excursion , unlabored           Heart/CV- RRR , no murmur , no gallop  , no rub, nl s1 s2                           - JVD- none , edema- none, stasis changes- none, varices- none           Lung- +few crackles R base, unlabored, wheeze- none, cough- none , dullness-none,                   rub- none           Chest wall-  Abd-  Br/ Gen/ Rectal- Not done, not indicated Extrem- cyanosis- none, clubbing, none, atrophy- none, strength- nl Neuro- grossly intact to observation

## 2013-09-16 ENCOUNTER — Encounter (HOSPITAL_COMMUNITY): Payer: Self-pay | Admitting: Pulmonary Disease

## 2013-09-16 DIAGNOSIS — R042 Hemoptysis: Secondary | ICD-10-CM | POA: Diagnosis not present

## 2013-09-16 LAB — AFB STAIN
Acid Fast Smear: NONE SEEN
SPECIAL REQUESTS: NORMAL

## 2013-09-16 NOTE — Op Note (Signed)
Dictation #:  [id number]

## 2013-09-17 ENCOUNTER — Telehealth: Payer: Self-pay | Admitting: *Deleted

## 2013-09-17 ENCOUNTER — Telehealth: Payer: Self-pay | Admitting: Pulmonary Disease

## 2013-09-17 LAB — CULTURE, BAL-QUANTITATIVE: SPECIAL REQUESTS: NORMAL

## 2013-09-17 LAB — CULTURE, BAL-QUANTITATIVE W GRAM STAIN

## 2013-09-17 NOTE — Op Note (Signed)
NAMEBRAEDYN, KAUK NO.:  [id number]  MEDICAL RECORD NO.:  [id number]  LOCATION:  [facility]                         FACILITY:  [facility]  PHYSICIAN:  Kathee Delton, MD,FCCPDATE OF BIRTH:  1945-08-04  DATE OF PROCEDURE:  09/15/2013 DATE OF DISCHARGE:  09/15/2013                              OPERATIVE REPORT   PROCEDURE:  Flexible fiberoptic bronchoscopy with biopsy.  INDICATION FOR THE PROCEDURE:  Abnormal pulmonary infiltrates of undetermined origin along with significant new hemoptysis.  OPERATOR:  Kathee Delton, MD, [facility]  ANESTHESIA:  Versed 7.5 mg IV, Demerol 50 mg IV, and topical 1% lidocaine in the vocal cords and airways during the procedure.  DESCRIPTION OF PROCEDURE:  After obtaining informed consent and under close cardiopulmonary monitoring, the above preop anesthesia was given and the fiberoptic scope was passed through the right naris into the posterior pharynx, where there were no lesions or other source of bleeding noted.  The vocal cords appeared to be within normal limits, and moved bilaterally on phonation.  The scope was then passed into the trachea, where it was examined along its entire length down to the level of the carina, all of which was normal.  The left tracheobronchial tree was examined closely to the subsegmental level with no endobronchial abnormality being found.  Attention was paid to the right tracheobronchial tree where the right upper lobe was noted to have 4 rather than 3 segments, but there was no endobronchial abnormality being noted.  The right middle lobe and bronchus intermedius was normal as well.  However, at the distal bronchus intermedius before the takeoff of the basilar segments, there was abnormal cobblestoning of mucosa just above the lower lobe takeoffs and along the posterior wall.  The basilar segments were examined very closely, and appeared normal except for the posterior basilar segment of the right lower  lobe that had a pedunculated mass of unknown etiology that was quite friable and bled very easily.  The distal posterior basilar segment appeared to be free of disease.  Bronchoalveolar lavage was done from the posterior basilar segment of the right lower lobe and sent for the usual studies. Bronchial brushes were done distally in the right lower lobe under fluoroscopic guidance with air dry slides for AFB as well as cytology. Endobronchial brushes were also done of the abnormal cobblestoning in the distal bronchus intermedius as well as the pedunculated mass in the posterior basilar segment of the right lower lobe.  Finally, endobronchial pinch biopsies were done of the mass in question with significant bleeding noted thereafter despite topical epinephrine. Tamponade was done with the scope until the bleeding subsided, and the patient remained stable throughout.  By the end of the procedure, the bleeding had subsided.  Biopsies were not taken for cobblestoning area at the terminal bronchus intermedius because of obscured vision after the bleeding episode.  Overall, the patient tolerated the procedure well.  There were no complications. Chest x-ray post procedure showed no pneumothorax.     Kathee Delton, MD,FCCP     [facility]/MEDQ  D:  09/16/2013  T:  09/17/2013  Job:  [id number]

## 2013-09-17 NOTE — Telephone Encounter (Signed)
Transferred to pulmonary

## 2013-09-17 NOTE — Telephone Encounter (Signed)
Spoke with patient.  Is very anxious to receive results. Pt states that when he spoke with [facility] the other day, he was told that he could possibly have a type of cancer. Explained to the patient that Dr Gwenette Greet is very thorough and will contact him as soon as he receives the final pathology results.   Dr Gwenette Greet please advise. Thanks

## 2013-09-18 ENCOUNTER — Telehealth: Payer: Self-pay | Admitting: Pulmonary Disease

## 2013-09-18 NOTE — Telephone Encounter (Signed)
Pt has called back to get his results from bronchoscopy.

## 2013-09-18 NOTE — Telephone Encounter (Signed)
Can let pt know that I checked first thing this am, and his results still are not back. Will call him as soon as I know something.

## 2013-09-18 NOTE — Telephone Encounter (Signed)
Spoke with Patient--made him aware that [facility] has received his results and will contact him tonight before leaving office. Pt states that he will wait for his call. (Dr Gwenette Greet aware and states he will contact pt before leaving today) Nothing further needed.

## 2013-09-18 NOTE — Telephone Encounter (Signed)
Pt advised and I assured him we will call once we have results. [place] Bing, CMA

## 2013-09-18 NOTE — Telephone Encounter (Signed)
Discussed bx results with pt.  His RLL TBBX c/w sarcoid.  His bx of proximal posterior basilar segment lesion show fibrosis and inflammatory material.  Unclear if sarcoid, or we did not get good bx (due to bleeding).  Pt has never smoked.  Could be carcinoid because of excessive bleeding.  Will treat with prednisone, and will need repeat bronch in 8 weeks.      [place], please call in prednisone 20mg  for this pt, and he is to take 2 each am for 2 weeks, then 1 1/2 each am until his visit with me.  Need to see him back in 4 weeks.

## 2013-09-21 MED ORDER — PREDNISONE 20 MG PO TABS: ORAL_TABLET | ORAL | Status: DC

## 2013-09-21 NOTE — Telephone Encounter (Signed)
Pt scheduled to follow up (4 weeks) with [facility] 10/12/13 at 11:45. Aware that Rx called into pharmacy.

## 2013-09-21 NOTE — Telephone Encounter (Signed)
Pred 20mg  (Sig:) Take 2 tabs every morning x 2 weeks then take 1.5 tabs daily until OV with Dr Gwenette Greet. #100 x 0 refills Sent to [facility] [address]  Pt needs OV with [facility] in 4 weeks.

## 2013-10-05 ENCOUNTER — Other Ambulatory Visit (HOSPITAL_COMMUNITY)
Admission: RE | Admit: 2013-10-05 | Discharge: 2013-10-05 | Disposition: A | Discharge status: Home / Self Care | Payer: Medicare HMO | Source: Ambulatory Visit | Attending: Family Medicine | Admitting: Family Medicine

## 2013-10-05 ENCOUNTER — Emergency Department (HOSPITAL_COMMUNITY)
Admission: EM | Admit: 2013-10-05 | Discharge: 2013-10-05 | Disposition: A | Discharge status: Home / Self Care | Payer: Medicare HMO | Source: Home / Self Care | Attending: Family Medicine | Admitting: Family Medicine

## 2013-10-05 ENCOUNTER — Encounter (HOSPITAL_COMMUNITY): Payer: Self-pay | Admitting: Emergency Medicine

## 2013-10-05 DIAGNOSIS — N342 Other urethritis: Secondary | ICD-10-CM

## 2013-10-05 DIAGNOSIS — Z113 Encounter for screening for infections with a predominantly sexual mode of transmission: Secondary | ICD-10-CM | POA: Insufficient documentation

## 2013-10-05 MED ORDER — METRONIDAZOLE 500 MG PO TABS: 500.0000 mg | ORAL_TABLET | Freq: Two times a day (BID) | ORAL | Status: DC

## 2013-10-05 NOTE — ED Notes (Signed)
States he has been informed of poss trichomonas infection. Denies any symptoms

## 2013-10-05 NOTE — Discharge Instructions (Signed)
Thank you for coming in today. Take metronidazole twice daily for one week Do not take with alcohol. Come back as needed. Trichomoniasis Trichomoniasis is an infection, caused by the Trichomonas organism, that affects both women and men. In women, the outer male genitalia and the vagina are affected. In men, the penis is mainly affected, but the prostate and other reproductive organs can also be involved. Trichomoniasis is a sexually transmitted disease (STD) and is most often passed to another person through sexual contact. The majority of people who get trichomoniasis do so from a sexual encounter and are also at risk for other STDs. CAUSES   Sexual intercourse with an infected partner.  It can be present in swimming pools or hot tubs. SYMPTOMS   Abnormal gray-green frothy vaginal discharge in women.  Vaginal itching and irritation in women.  Itching and irritation of the area outside the vagina in women.  Penile discharge with or without pain in males.  Inflammation of the urethra (urethritis), causing painful urination.  Bleeding after sexual intercourse. RELATED COMPLICATIONS  Pelvic inflammatory disease.  Infection of the uterus (endometritis).  Infertility.  Tubal (ectopic) pregnancy.  It can be associated with other STDs, including gonorrhea and chlamydia, hepatitis B, and HIV. COMPLICATIONS DURING PREGNANCY  Early (premature) delivery.  Premature rupture of the membranes (PROM).  Low birth weight. DIAGNOSIS   Visualization of Trichomonas under the microscope from the vagina discharge.  Ph of the vagina greater than 4.5, tested with a test tape.  Trich Rapid Test.  Culture of the organism, but this is not usually needed.  It may be found on a Pap test.  Having a "strawberry cervix,"which means the cervix looks very red like a strawberry. TREATMENT   You may be given medication to fight the infection. Inform your caregiver if you could be or are  pregnant. Some medications used to treat the infection should not be taken during pregnancy.  Over-the-counter medications or creams to decrease itching or irritation may be recommended.  Your sexual partner will need to be treated if infected. HOME CARE INSTRUCTIONS   Take all medication prescribed by your caregiver.  Take over-the-counter medication for itching or irritation as directed by your caregiver.  Do not have sexual intercourse while you have the infection.  Do not douche or wear tampons.  Discuss your infection with your partner, as your partner may have acquired the infection from you. Or, your partner may have been the person who transmitted the infection to you.  Have your sex partner examined and treated if necessary.  Practice safe, informed, and protected sex.  See your caregiver for other STD testing. SEEK MEDICAL CARE IF:   You still have symptoms after you finish the medication.  You have an oral temperature above 102 F (38.9 C).  You develop belly (abdominal) pain.  You have pain when you urinate.  You have bleeding after sexual intercourse.  You develop a rash.  The medication makes you sick or makes you throw up (vomit). Document Released: 12/26/2000 Document Revised: 09/24/2011 Document Reviewed: 01/21/2009 [facility] Patient Information 2014 [place], [place].

## 2013-10-05 NOTE — ED Provider Notes (Signed)
[name] is a 68 y.o. male who presents to Urgent Care today for Trichomonas exposure. Patient's sexual partner recently informed him that he may be at rest or trichomonas. His last contact was a few months ago. He is asymptomatic. No penile discharge fevers chills nausea vomiting or diarrhea. He feels well otherwise. He is currently undergoing evaluation for sarcoidosis. He is on prednisone.   Past Medical History  Diagnosis Date  . Sarcoidosis   . Hx of nonmelanoma skin cancer   . Upper GI bleeding     2013 upper GI bleed    History  Substance Use Topics  . Smoking status: Never Smoker   . Smokeless tobacco: Never Used  . Alcohol Use: No   ROS as above Medications: No current facility-administered medications for this encounter.   Current Outpatient Prescriptions  Medication Sig Dispense Refill  . predniSONE (DELTASONE) 20 MG tablet Take 2 tabs every morning x 2 weeks then take 1.5 tabs daily until OV with Dr Gwenette Greet.  100 tablet  0  . chlorhexidine (PERIDEX) 0.12 % solution daily as needed.      Marland Kitchen HYDROcodone-acetaminophen (NORCO) 7.5-325 MG per tablet Take 1 tablet by mouth as needed.      Marland Kitchen levofloxacin (LEVAQUIN) 500 MG tablet Take 1 tablet by mouth daily.      Marland Kitchen omeprazole (PRILOSEC) 20 MG capsule Take 1 capsule by mouth daily.        Exam:  BP 141/81  Pulse 80  Temp(Src) 97.7 F (36.5 C) (Oral)  Resp 18  SpO2 98% Gen: Well NAD Genitals: No inguinal lymphadenopathy. Normal circumcised penis without discharge. Testicles are descended and nontender bilaterally with no masses.   Assessment and Plan: 68 y.o. male with Trichomonas exposure. Cytology pending. Empiric treatment with Flagyl..   Discussed warning signs or symptoms. Please see discharge instructions. Patient expresses understanding.    Gregor Hams, MD 10/05/13 [phone]

## 2013-10-05 NOTE — ED Notes (Signed)
Call back number for lab issues verified at release  

## 2013-10-06 LAB — URINE CYTOLOGY ANCILLARY ONLY
CHLAMYDIA, DNA PROBE: NEGATIVE
NEISSERIA GONORRHEA: NEGATIVE
Trichomonas: NEGATIVE

## 2013-10-07 NOTE — ED Notes (Signed)
Patient in department requesting paper copy of notes

## 2013-10-09 ENCOUNTER — Other Ambulatory Visit: Payer: Self-pay | Admitting: Internal Medicine

## 2013-10-09 DIAGNOSIS — R894 Abnormal immunological findings in specimens from other organs, systems and tissues: Secondary | ICD-10-CM

## 2013-10-12 ENCOUNTER — Encounter: Payer: Self-pay | Admitting: Pulmonary Disease

## 2013-10-12 ENCOUNTER — Ambulatory Visit (INDEPENDENT_AMBULATORY_CARE_PROVIDER_SITE_OTHER): Payer: Medicare HMO | Admitting: Pulmonary Disease

## 2013-10-12 VITALS — BP 124/88 | HR 65 | Temp 97.6°F | Ht 71.5 in | Wt 192.2 lb

## 2013-10-12 DIAGNOSIS — D869 Sarcoidosis, unspecified: Secondary | ICD-10-CM

## 2013-10-12 DIAGNOSIS — R222 Localized swelling, mass and lump, trunk: Secondary | ICD-10-CM

## 2013-10-12 DIAGNOSIS — R918 Other nonspecific abnormal finding of lung field: Secondary | ICD-10-CM | POA: Insufficient documentation

## 2013-10-12 LAB — FUNGUS CULTURE W SMEAR
Fungal Smear: NONE SEEN
SPECIAL REQUESTS: NORMAL

## 2013-10-12 NOTE — Progress Notes (Signed)
   Subjective:    Patient ID: [name], male    DOB: 1945-12-23, 68 y.o.   MRN: [id number]  HPI The patient comes in today for followup after his recent bronchoscopy. He carries a diagnosis of sarcoidosis, and has an unusual chronic appearing right lower lobe process. The question was raised whether he may have another superimposed process such as tuberculosis, and his quantiferon gold for TB was positive.  The patient had been having increasing cough along with hemoptysis. He underwent bronchoscopy where right lower lobe transbronchial biopsy showed nonnecrotizing granulomas. Stains for AFB and other organisms are negative. The patient was also found to have a masslike lesion in the posterior basilar segment of the right lower lobe, which when biopsied resulted in considerable bleeding. No further biopsies were able to be obtained. Pathology showed inflammatory reaction but no granuloma or malignancy. It is unclear whether this represents a carcinoid, a malignancy, or whether he is granulomatous inflammation from endobronchial sarcoid. The patient was empirically started on steroids, and states that his cough and hemoptysis have greatly improved. He is no longer having hemoptysis at this time. He continues to have shortness of breath, and thinks that it may be a little worse than 3 months ago. The patient never had his pulmonary function studies done.   Review of Systems  Constitutional: Positive for diaphoresis. Negative for fever and unexpected weight change.  HENT: Negative for congestion, dental problem, ear pain, nosebleeds, postnasal drip, rhinorrhea, sinus pressure, sneezing, sore throat and trouble swallowing.   Eyes: Negative for redness and itching.  Respiratory: Positive for cough and shortness of breath. Negative for chest tightness and wheezing.   Cardiovascular: Negative for palpitations and leg swelling.  Gastrointestinal: Negative for nausea and vomiting.  Genitourinary: Negative  for dysuria.  Musculoskeletal: Negative for joint swelling.  Skin: Negative for rash.  Neurological: Negative for headaches.  Hematological: Does not bruise/bleed easily.  Psychiatric/Behavioral: Negative for dysphoric mood. The patient is not nervous/anxious.        Objective:   Physical Exam Well-developed male in no acute distress Nose without purulence or discharge noted Neck without lymphadenopathy or thyromegaly Chest with some mild basilar crackles, otherwise clear Cardiac exam with regular rate and rhythm Lower extremities with minimal edema, no cyanosis Alert and oriented, moves all 4 extremities appear       Assessment & Plan:

## 2013-10-12 NOTE — Assessment & Plan Note (Signed)
The patient has verification of sarcoidosis by his right lower lobe transbronchial lung biopsy. He has been started on prednisone, and although he feels his cough is much improved, it has not made a significant difference to his breathing as of yet. I have told him that he has very extensive disease on his chest CT, and it may take a while for the prednisone to help him. If the majority of this is chronic scarring, we may not be able to change his breathing very much over time even with prednisone.

## 2013-10-12 NOTE — Patient Instructions (Signed)
Decrease prednisone to 30mg  a day for 14 days, then decrease to 25mg  a day and stay there. Will schedule for breathing studies, and will call you with results and discuss.   Would like to see you back in 6 weeks, but call if having worsening issues.

## 2013-10-12 NOTE — Assessment & Plan Note (Signed)
The patient had a mass like lesion in the posterior basilar segment of the right lower lobe which bled significantly with biopsy.  I suspect this represents granulomatous inflammation from endobronchial sarcoid, but cannot exclude the possibility of carcinoid or an occult bronchogenic cancer. The patient has never smoked. At least, his cough has significantly improved, and his hemoptysis has resolved. He is going to need repeat bronchoscopy in another 4-6 weeks to ensure this area is improving on steroids.

## 2013-10-13 ENCOUNTER — Telehealth: Payer: Self-pay | Admitting: Internal Medicine

## 2013-10-13 MED ORDER — HYDROCODONE-HOMATROPINE 5-1.5 MG/5ML PO SYRP: 5.0000 mL | ORAL_SOLUTION | Freq: Four times a day (QID) | ORAL | Status: DC | PRN

## 2013-10-13 NOTE — Telephone Encounter (Signed)
Spoke with the pt  Pt states that he was up all night last night with cough Cough is prod with moderate white sputum  He reports that he is no worse since seen yesterday 10/12/13, and has no new co's Would like something for cough  Has not tried anything OTC yet  Please advise, thanks! Allergies  Allergen Reactions  . Penicillins Other (See Comments)    Unknown reaction

## 2013-10-13 NOTE — Telephone Encounter (Signed)
Pt seen by [facility] 10/12/13: Decrease prednisone to 30mg  a day for 14 days, then decrease to 25mg  a day and stay there. Will schedule for breathing studies, and will call you with results and discuss.    Would like to see you back in 6 weeks, but call if having worsening issues. --  LMTCB x1 for pt

## 2013-10-13 NOTE — Telephone Encounter (Signed)
Pt aware of recs. rx printed and he will pick up in the AM.

## 2013-10-13 NOTE — Telephone Encounter (Signed)
Ok to call in hycodan syrup, one teaspoon every 6hrs if needed for cough.  #4 ounces, no fills.

## 2013-10-14 ENCOUNTER — Inpatient Hospital Stay: Admission: RE | Admit: 2013-10-14 | Payer: Medicare HMO | Source: Ambulatory Visit

## 2013-10-20 ENCOUNTER — Ambulatory Visit (INDEPENDENT_AMBULATORY_CARE_PROVIDER_SITE_OTHER): Payer: Medicare HMO | Admitting: Internal Medicine

## 2013-10-20 ENCOUNTER — Telehealth: Payer: Self-pay | Admitting: Pulmonary Disease

## 2013-10-20 ENCOUNTER — Encounter: Payer: Self-pay | Admitting: Internal Medicine

## 2013-10-20 ENCOUNTER — Ambulatory Visit (INDEPENDENT_AMBULATORY_CARE_PROVIDER_SITE_OTHER): Payer: Medicare HMO | Admitting: Pulmonary Disease

## 2013-10-20 VITALS — BP 134/86 | HR 81 | Ht 71.0 in | Wt 190.4 lb

## 2013-10-20 DIAGNOSIS — R0609 Other forms of dyspnea: Secondary | ICD-10-CM

## 2013-10-20 DIAGNOSIS — D869 Sarcoidosis, unspecified: Secondary | ICD-10-CM

## 2013-10-20 DIAGNOSIS — R0989 Other specified symptoms and signs involving the circulatory and respiratory systems: Secondary | ICD-10-CM

## 2013-10-20 NOTE — Telephone Encounter (Signed)
Spoke with pt. He scheduled appt to come in and see [facility] 10/22/13. Nothing further needed

## 2013-10-20 NOTE — Assessment & Plan Note (Signed)
To follow with Dr Gwenette Greet

## 2013-10-20 NOTE — Progress Notes (Signed)
Patient ID: [name], male   DOB: [birth year], 68 y.o.   MRN: [id number] Patient inappropriately scheduled with me. He is to follow up with Dr Gwenette Greet for sarcoid after bronchoscopy

## 2013-10-20 NOTE — Telephone Encounter (Signed)
Spoke with pt  He is c/o increased cough x 2 wks- prod with moderate white sputum and esp worse in the pm  He is taking hydrocodone cough syrup and this is not helping with cough and making him forgetful  He also has noticed minimal wheeze  Dyspnea unchanged  Had PFT today  [place], please advise, thanks!

## 2013-10-20 NOTE — Progress Notes (Signed)
PFT done today. 

## 2013-10-20 NOTE — Telephone Encounter (Signed)
He needs ov this week or next to address these ongoing issues and to review pfts.

## 2013-10-22 ENCOUNTER — Ambulatory Visit: Payer: Medicare HMO | Admitting: Pulmonary Disease

## 2013-10-28 LAB — AFB CULTURE WITH SMEAR (NOT AT ARMC)
Acid Fast Smear: NONE SEEN
Special Requests: NORMAL

## 2013-11-03 ENCOUNTER — Encounter: Payer: Self-pay | Admitting: Pulmonary Disease

## 2013-11-04 ENCOUNTER — Other Ambulatory Visit: Payer: Medicare HMO

## 2013-11-10 LAB — PULMONARY FUNCTION TEST
DL/VA % PRED: 88 %
DL/VA: 4.15 ml/min/mmHg/L
DLCO unc % pred: 62 %
DLCO unc: 21.12 ml/min/mmHg
FEF 25-75 Post: 1.62 L/sec
FEF 25-75 Pre: 2.06 L/sec
FEF2575-%CHANGE-POST: -21 %
FEF2575-%PRED-PRE: 74 %
FEF2575-%Pred-Post: 58 %
FEV1-%CHANGE-POST: -3 %
FEV1-%PRED-PRE: 85 %
FEV1-%Pred-Post: 83 %
FEV1-Post: 2.59 L
FEV1-Pre: 2.67 L
FEV1FVC-%Change-Post: 0 %
FEV1FVC-%PRED-PRE: 97 %
FEV6-%Change-Post: -4 %
FEV6-%Pred-Post: 86 %
FEV6-%Pred-Pre: 90 %
FEV6-Post: 3.37 L
FEV6-Pre: 3.54 L
FEV6FVC-%Change-Post: 0 %
FEV6FVC-%PRED-PRE: 103 %
FEV6FVC-%Pred-Post: 102 %
FVC-%CHANGE-POST: -3 %
FVC-%Pred-Post: 84 %
FVC-%Pred-Pre: 87 %
FVC-POST: 3.43 L
FVC-Pre: 3.57 L
POST FEV1/FVC RATIO: 76 %
Post FEV6/FVC ratio: 98 %
Pre FEV1/FVC ratio: 75 %
Pre FEV6/FVC Ratio: 99 %
RV % pred: 67 %
RV: 1.64 L
TLC % pred: 74 %
TLC: 5.37 L

## 2013-11-12 ENCOUNTER — Other Ambulatory Visit: Payer: Self-pay | Admitting: Internal Medicine

## 2013-11-12 ENCOUNTER — Other Ambulatory Visit: Payer: Commercial Managed Care - HMO

## 2013-11-12 DIAGNOSIS — B1920 Unspecified viral hepatitis C without hepatic coma: Secondary | ICD-10-CM

## 2013-11-13 LAB — HCV RNA QUANT RFLX ULTRA OR GENOTYP
HCV QUANT: 1953171 [IU]/mL — AB (ref <15)
HCV Quantitative Log: 6.29 {Log} — ABNORMAL HIGH (ref <1.18)

## 2013-11-16 LAB — HEPATITIS C GENOTYPE

## 2013-11-20 ENCOUNTER — Encounter: Payer: Self-pay | Admitting: Pulmonary Disease

## 2013-11-20 ENCOUNTER — Ambulatory Visit (INDEPENDENT_AMBULATORY_CARE_PROVIDER_SITE_OTHER): Payer: Commercial Managed Care - HMO | Admitting: Pulmonary Disease

## 2013-11-20 VITALS — BP 132/78 | HR 99 | Temp 98.2°F | Ht 71.0 in | Wt 190.6 lb

## 2013-11-20 DIAGNOSIS — D869 Sarcoidosis, unspecified: Secondary | ICD-10-CM

## 2013-11-20 DIAGNOSIS — R918 Other nonspecific abnormal finding of lung field: Secondary | ICD-10-CM

## 2013-11-20 DIAGNOSIS — R222 Localized swelling, mass and lump, trunk: Secondary | ICD-10-CM

## 2013-11-20 MED ORDER — PREDNISONE 10 MG PO TABS: ORAL_TABLET | ORAL | Status: DC

## 2013-11-20 NOTE — Assessment & Plan Note (Signed)
The patient has had significant improvement in his constitutional symptoms, as well as cough and mucus since being on prednisone. He primarily complains of fatigue with exertional activities, and not necessarily shortness of breath on specific questioning. He admits that he is deconditioned, and I have asked him to work on this. He may also feel better on lower doses of prednisone, and we'll begin to wean his dose. I would like to keep him on a floor of 15 mg a day.

## 2013-11-20 NOTE — Assessment & Plan Note (Signed)
The patient needs a followup bronchoscopy to determine if the abnormality seen initially was related to sarcoid or some other process. Will schedule this for the end of the month.

## 2013-11-20 NOTE — Patient Instructions (Signed)
Will change your prednisone to 10mg  tabs, and take 2 1/2 each day for one week, then 2 each day for one week, then 1 1/2 each day and stay there.   Work on conditioning with 30-24min of exercise at least 3-4 times a week.  Ok to push yourself. Will schedule for bronchoscopy on May 29 at 730 am.

## 2013-11-20 NOTE — Progress Notes (Signed)
   Subjective:    Patient ID: [name], male    DOB: 09/15/1945, 68 y.o.   MRN: [id number]  HPI Patient comes in today for followup of his known pulmonary sarcoidosis. He has been on a steroid taper, and has seen significant improvement in his constitutional symptoms. His cough and mucus have also resolved. The patient tells me that whenever he does activities such as walking or trying to exercise that he gets significant fatigue, but does not believe this is shortness of breath. I have asked him to work on his conditioning. He has had pulmonary function studies done last month which showed no airflow obstruction, mild restriction, and a mild reduction in his diffusion capacity.   Review of Systems  Constitutional: Negative for fever and unexpected weight change.  HENT: Negative for congestion, dental problem, ear pain, nosebleeds, postnasal drip, rhinorrhea, sinus pressure, sneezing, sore throat and trouble swallowing.   Eyes: Negative for redness and itching.  Respiratory: Positive for cough, chest tightness and shortness of breath. Negative for wheezing.   Cardiovascular: Negative for palpitations and leg swelling.  Gastrointestinal: Negative for nausea and vomiting.  Genitourinary: Negative for dysuria.  Musculoskeletal: Negative for joint swelling.  Skin: Negative for rash.  Neurological: Negative for headaches.  Hematological: Does not bruise/bleed easily.  Psychiatric/Behavioral: Negative for dysphoric mood. The patient is not nervous/anxious.        Objective:   Physical Exam Well-developed male in no acute distress Nose without purulence or discharge noted Neck without lymphadenopathy or thyromegaly Chest with fairly her breath sounds throughout, no wheezing Cardiac exam with regular rate and rhythm Lower extremities without edema, no cyanosis Alert and oriented, moves all 4 extremities.       Assessment & Plan:

## 2013-11-23 ENCOUNTER — Ambulatory Visit: Payer: Medicare HMO | Admitting: Pulmonary Disease

## 2013-12-08 ENCOUNTER — Telehealth: Payer: Self-pay | Admitting: Pulmonary Disease

## 2013-12-08 NOTE — Telephone Encounter (Signed)
See if he and [place] can do 6/2 at 730?

## 2013-12-08 NOTE — Telephone Encounter (Signed)
Spoke with pt. Currently has a bronch scheduled with [facility] on 12/11/13. He is wondering if this could be changed to 5/28, 6/1 or 6/2? The person that would be bringing him is off on those days. If this can't be arranged the pt still wants to keep appointment on 5/29.  [place] - would any of these days work with you?

## 2013-12-08 NOTE — Telephone Encounter (Signed)
[facility] closed at 4/430pm-- will need to call back in AM to reschedule LM with patient to return call x 1

## 2013-12-09 NOTE — Telephone Encounter (Signed)
Spoke with pt and Lattie Haw at [facility] and Charleen Kirks will be moved to 12/16/13 same time. - FYI for Dr Gwenette Greet

## 2013-12-09 NOTE — Telephone Encounter (Signed)
[place], I spoke with Lattie Haw at [facility] and she states she has someone on for 6/2 at 7:30am. Please advise. Thanks.

## 2013-12-09 NOTE — Telephone Encounter (Signed)
I can;'t do 6/1.  Either keep original apptm or move to 6/3 or 6/5 same time.

## 2013-12-09 NOTE — Telephone Encounter (Signed)
Pt is aware of the change made to his bronch. Will route message to [facility] and let him know as well.

## 2013-12-10 NOTE — Telephone Encounter (Signed)
[place] is aware.[place] Bing, CMA

## 2013-12-11 ENCOUNTER — Telehealth: Payer: Self-pay | Admitting: Pulmonary Disease

## 2013-12-11 ENCOUNTER — Encounter (HOSPITAL_COMMUNITY): Payer: Commercial Managed Care - HMO

## 2013-12-11 NOTE — Telephone Encounter (Signed)
Spoke with [name] at [facility] area-she states they call all patients and give the details of what time to be there, how long they will be there for, etc. I spoke with [name] and he is aware that he will get a call from the [facility] with all the needed information. Nothing more needed at this time.

## 2013-12-14 ENCOUNTER — Ambulatory Visit: Payer: Medicare HMO | Admitting: Internal Medicine

## 2013-12-16 ENCOUNTER — Encounter (HOSPITAL_COMMUNITY)
Admission: RE | Disposition: A | Discharge status: Home / Self Care | Payer: Self-pay | Source: Ambulatory Visit | Attending: Pulmonary Disease

## 2013-12-16 ENCOUNTER — Ambulatory Visit (HOSPITAL_COMMUNITY): Admission: RE | Admit: 2013-12-16 | Payer: Medicare HMO | Source: Ambulatory Visit | Admitting: Pulmonary Disease

## 2013-12-16 ENCOUNTER — Ambulatory Visit (HOSPITAL_COMMUNITY)
Admission: RE | Admit: 2013-12-16 | Discharge: 2013-12-16 | Disposition: A | Discharge status: Home / Self Care | Payer: Medicare HMO | Source: Ambulatory Visit | Attending: Pulmonary Disease | Admitting: Pulmonary Disease

## 2013-12-16 ENCOUNTER — Encounter (HOSPITAL_COMMUNITY): Admission: RE | Payer: Commercial Managed Care - HMO | Source: Ambulatory Visit

## 2013-12-16 ENCOUNTER — Encounter (HOSPITAL_COMMUNITY): Payer: Self-pay | Admitting: Respiratory Therapy

## 2013-12-16 DIAGNOSIS — R222 Localized swelling, mass and lump, trunk: Secondary | ICD-10-CM

## 2013-12-16 DIAGNOSIS — J99 Respiratory disorders in diseases classified elsewhere: Secondary | ICD-10-CM | POA: Insufficient documentation

## 2013-12-16 DIAGNOSIS — D869 Sarcoidosis, unspecified: Secondary | ICD-10-CM

## 2013-12-16 HISTORY — PX: VIDEO BRONCHOSCOPY: SHX5072

## 2013-12-16 SURGERY — VIDEO BRONCHOSCOPY WITHOUT FLUORO
Anesthesia: Moderate Sedation | Laterality: Bilateral

## 2013-12-16 MED ORDER — MIDAZOLAM HCL 10 MG/2ML IJ SOLN
INTRAMUSCULAR | Status: AC
  Filled 2013-12-16: qty 4

## 2013-12-16 MED ORDER — MIDAZOLAM HCL 10 MG/2ML IJ SOLN
INTRAMUSCULAR | Status: DC | PRN
  Administered 2013-12-16: 2.5 mg via INTRAVENOUS
  Administered 2013-12-16: 5 mg via INTRAVENOUS
  Administered 2013-12-16: 2.5 mg via INTRAVENOUS

## 2013-12-16 MED ORDER — MEPERIDINE HCL 100 MG/ML IJ SOLN
INTRAMUSCULAR | Status: AC
  Filled 2013-12-16: qty 2

## 2013-12-16 MED ORDER — PHENYLEPHRINE HCL 0.25 % NA SOLN: 1.0000 | Freq: Four times a day (QID) | NASAL | Status: DC | PRN

## 2013-12-16 MED ORDER — LIDOCAINE HCL 1 % IJ SOLN
INTRAMUSCULAR | Status: DC | PRN
  Administered 2013-12-16: 6 mL via RESPIRATORY_TRACT

## 2013-12-16 MED ORDER — PHENYLEPHRINE HCL 0.25 % NA SOLN
NASAL | Status: DC | PRN
  Administered 2013-12-16: 2 via NASAL

## 2013-12-16 MED ORDER — MEPERIDINE HCL 25 MG/ML IJ SOLN
INTRAMUSCULAR | Status: DC | PRN
  Administered 2013-12-16: 50 mg via INTRAVENOUS

## 2013-12-16 MED ORDER — BUTAMBEN-TETRACAINE-BENZOCAINE 2-2-14 % EX AERO: 1.0000 | INHALATION_SPRAY | Freq: Once | CUTANEOUS | Status: DC

## 2013-12-16 MED ORDER — SODIUM CHLORIDE 0.9 % IV SOLN
INTRAVENOUS | Status: DC
  Administered 2013-12-16: 07:00:00 via INTRAVENOUS

## 2013-12-16 MED ORDER — LIDOCAINE HCL 2 % EX GEL: Freq: Once | CUTANEOUS | Status: DC

## 2013-12-16 MED ORDER — LIDOCAINE HCL 2 % EX GEL
CUTANEOUS | Status: DC | PRN
  Administered 2013-12-16: 1

## 2013-12-16 NOTE — Op Note (Signed)
Dictation #:  [id number]

## 2013-12-16 NOTE — Discharge Instructions (Signed)
Flexible Bronchoscopy, Care After These instructions give you information on caring for yourself after your procedure. Your doctor may also give you more specific instructions. Call your doctor if you have any problems or questions after your procedure. HOME CARE  Do not eat or drink anything for 2 hours after your procedure. If you try to eat or drink before the medicine wears off, food or drink could go into your lungs. You could also burn yourself.  After 2 hours have passed and when you can cough and gag normally, you may eat soft food and drink liquids slowly.  The day after the test, you may eat your normal diet.  You may do your normal activities.  Keep all doctor visits. GET HELP RIGHT AWAY IF:  You get more and more short of breath.  You get lightheaded.  You feel like you are going to pass out (faint).  You have chest pain.  You have new problems that worry you.  You cough up more than a little blood.  You cough up more blood than before. MAKE SURE YOU:  Understand these instructions.  Will watch your condition.  Will get help right away if you are not doing well or get worse. Document Released: 04/29/2009 Document Revised: 04/22/2013 Document Reviewed: 03/06/2013 [facility] Patient Information 2014 [place].   Nothing to eat or drink until  10:30 am    Today   12/16/2013

## 2013-12-16 NOTE — Progress Notes (Signed)
Video Bronchoscopy done  Intervention bronchial washing Intervention bronchial biopsy Intervention bronchial brushing   Procedure tolerated well 

## 2013-12-16 NOTE — H&P (View-Only) (Signed)
   Subjective:    Patient ID: [name], male    DOB: 12/23/1945, 67 y.o.   MRN: [id number]  HPI Patient comes in today for followup of his known pulmonary sarcoidosis. He has been on a steroid taper, and has seen significant improvement in his constitutional symptoms. His cough and mucus have also resolved. The patient tells me that whenever he does activities such as walking or trying to exercise that he gets significant fatigue, but does not believe this is shortness of breath. I have asked him to work on his conditioning. He has had pulmonary function studies done last month which showed no airflow obstruction, mild restriction, and a mild reduction in his diffusion capacity.   Review of Systems  Constitutional: Negative for fever and unexpected weight change.  HENT: Negative for congestion, dental problem, ear pain, nosebleeds, postnasal drip, rhinorrhea, sinus pressure, sneezing, sore throat and trouble swallowing.   Eyes: Negative for redness and itching.  Respiratory: Positive for cough, chest tightness and shortness of breath. Negative for wheezing.   Cardiovascular: Negative for palpitations and leg swelling.  Gastrointestinal: Negative for nausea and vomiting.  Genitourinary: Negative for dysuria.  Musculoskeletal: Negative for joint swelling.  Skin: Negative for rash.  Neurological: Negative for headaches.  Hematological: Does not bruise/bleed easily.  Psychiatric/Behavioral: Negative for dysphoric mood. The patient is not nervous/anxious.        Objective:   Physical Exam Well-developed male in no acute distress Nose without purulence or discharge noted Neck without lymphadenopathy or thyromegaly Chest with fairly her breath sounds throughout, no wheezing Cardiac exam with regular rate and rhythm Lower extremities without edema, no cyanosis Alert and oriented, moves all 4 extremities.       Assessment & Plan:   

## 2013-12-16 NOTE — Interval H&P Note (Signed)
History and Physical Interval Note:  12/16/2013 3:12 PM  [name]  has presented today for surgery, with the diagnosis of Sarcoidosis  The various methods of treatment have been discussed with the patient and family. After consideration of risks, benefits and other options for treatment, the patient has consented to  Procedure(s): VIDEO BRONCHOSCOPY WITHOUT FLUORO (Bilateral) as a surgical intervention .  The patient's history has been reviewed, patient examined, no change in status, stable for surgery.  I have reviewed the patient's chart and labs.  Questions were answered to the patient's satisfaction.     Armando Reichert Agam Tuohy

## 2013-12-17 ENCOUNTER — Encounter (HOSPITAL_COMMUNITY): Payer: Self-pay | Admitting: Pulmonary Disease

## 2013-12-17 ENCOUNTER — Telehealth: Payer: Self-pay | Admitting: Pulmonary Disease

## 2013-12-17 NOTE — Telephone Encounter (Signed)
Pt had bronch done 12/16/13. Please advise [place] thanks

## 2013-12-17 NOTE — Telephone Encounter (Signed)
Pt aware. Will send to [facility] to follow up on.

## 2013-12-17 NOTE — Telephone Encounter (Signed)
Still waiting.  May be back tomorrow.

## 2013-12-17 NOTE — Op Note (Signed)
[name], [name] NO.:  [id number]  MEDICAL RECORD NO.:  [id number]  LOCATION:  [facility]                         FACILITY:  [facility]  PHYSICIAN:  Kathee Delton, MD,FCCPDATE OF BIRTH:  August 31, 1945  DATE OF PROCEDURE:  12/16/2013 DATE OF DISCHARGE:  12/16/2013                              OPERATIVE REPORT   PROCEDURE:  Flexible fiberoptic bronchoscopy with biopsy.  INDICATION FOR THE PROCEDURE:  Abnormal bronchoscopy recently with endobronchial abnormality noted.  Unclear if this represented his known sarcoid or possibly a malignancy.  The patient needed re-evaluation after being on prednisone.  OPERATOR:  Kathee Delton, MD, [facility]  ANESTHESIA:  Demerol 50 mg IV, Versed 10 mg IV and various aliquots, also topical 1% lidocaine in the vocal cords and airways during the procedure.  DESCRIPTION OF PROCEDURE:  After obtaining informed consent and under close cardiopulmonary monitoring, the fiberoptic scope was passed to the right naris into the posterior pharynx where there was no lesions or other abnormalities seen.  The vocal cords appeared to be within normal limits upon pronation, and the scope was then passed into the trachea where it was examined along its entire length down to the level of the carina.  This appeared to be sharp and free of disease.  The left tracheobronchial tree was then examined serially at the subsegmental level with no endobronchial abnormality being found.  Attention was then paid to the right tracheobronchial tree where no abnormalities were noted until the scope was passed into the posterior basilar segment of the right lower lobe.  There, was the prior documented endobronchial abnormality, but was obviously much improved from the last bronchoscopy. It was not as friable, nor did it bleed to any significant degree with biopsy.  Endobronchial pinch biopsies, bronchial brushes, and bronchial washes were all done at this area, and sent  for the usual cytologic and bacteriologic evaluation.  Overall, the patient tolerated the procedure well and there were no complications.     Kathee Delton, MD,FCCP     [facility]/MEDQ  D:  12/16/2013  T:  12/17/2013  Job:  [id number]

## 2013-12-18 LAB — CULTURE, BAL-QUANTITATIVE: SPECIAL REQUESTS: NORMAL

## 2013-12-18 LAB — CULTURE, BAL-QUANTITATIVE W GRAM STAIN

## 2013-12-21 ENCOUNTER — Telehealth: Payer: Self-pay | Admitting: *Deleted

## 2013-12-21 NOTE — Telephone Encounter (Signed)
Notes Recorded by Kathee Delton, MD on 12/18/2013 at 8:51 AM Please let pt know that his biopsies were all negative for cancer. Just inflammatory change at this point, and probably due to his sarcoid. Will keep an eye on this going forward with cxr's. Good news  Pt advised. [place], CMA

## 2013-12-22 ENCOUNTER — Telehealth: Payer: Self-pay | Admitting: Pulmonary Disease

## 2013-12-22 NOTE — Telephone Encounter (Signed)
Spoke with the pt  He is asking how to take pred  I advised that he needs to follow the instructions from last avs  He still has this and will follow recs- once gets to 1 1/2 tablets daily will stay there until next ov  Nothing further needed

## 2014-01-05 ENCOUNTER — Other Ambulatory Visit: Payer: Commercial Managed Care - HMO

## 2014-01-28 ENCOUNTER — Other Ambulatory Visit: Payer: Self-pay | Admitting: Pulmonary Disease

## 2014-05-06 ENCOUNTER — Ambulatory Visit: Payer: Commercial Managed Care - HMO | Admitting: Internal Medicine

## 2014-06-03 ENCOUNTER — Encounter: Payer: Self-pay | Admitting: Internal Medicine

## 2014-06-03 ENCOUNTER — Ambulatory Visit (INDEPENDENT_AMBULATORY_CARE_PROVIDER_SITE_OTHER): Payer: Commercial Managed Care - HMO | Admitting: Internal Medicine

## 2014-06-03 VITALS — BP 124/75 | HR 71 | Temp 97.7°F | Ht 71.0 in | Wt 192.5 lb

## 2014-06-03 DIAGNOSIS — B182 Chronic viral hepatitis C: Secondary | ICD-10-CM

## 2014-06-03 LAB — COMPLETE METABOLIC PANEL WITH GFR
ALT: 33 U/L (ref 0–53)
AST: 43 U/L — ABNORMAL HIGH (ref 0–37)
Albumin: 3.4 g/dL — ABNORMAL LOW (ref 3.5–5.2)
Alkaline Phosphatase: 44 U/L (ref 39–117)
BILIRUBIN TOTAL: 1.1 mg/dL (ref 0.2–1.2)
BUN: 15 mg/dL (ref 6–23)
CO2: 24 meq/L (ref 19–32)
CREATININE: 0.86 mg/dL (ref 0.50–1.35)
Calcium: 9 mg/dL (ref 8.4–10.5)
Chloride: 106 mEq/L (ref 96–112)
GFR, Est Non African American: >89 mL/min
Glucose, Bld: 117 mg/dL — ABNORMAL HIGH (ref 70–99)
Potassium: 3.8 mEq/L (ref 3.5–5.3)
SODIUM: 139 meq/L (ref 135–145)
Total Protein: 7.8 g/dL (ref 6.0–8.3)

## 2014-06-03 LAB — CBC WITH DIFFERENTIAL/PLATELET
Basophils Absolute: 0 10*3/uL (ref 0.0–0.1)
Basophils Relative: 0 % (ref 0–1)
EOS PCT: 2 % (ref 0–5)
Eosinophils Absolute: 0.1 10*3/uL (ref 0.0–0.7)
HEMATOCRIT: 43.8 % (ref 39.0–52.0)
Hemoglobin: 15.5 g/dL (ref 13.0–17.0)
LYMPHS ABS: 1.9 10*3/uL (ref 0.7–4.0)
LYMPHS PCT: 37 % (ref 12–46)
MCH: 30.2 pg (ref 26.0–34.0)
MCHC: 35.4 g/dL (ref 30.0–36.0)
MCV: 85.4 fL (ref 78.0–100.0)
MONOS PCT: 13 % — AB (ref 3–12)
MPV: 10.1 fL (ref 9.4–12.4)
Monocytes Absolute: 0.7 10*3/uL (ref 0.1–1.0)
NEUTROS PCT: 48 % (ref 43–77)
Neutro Abs: 2.4 10*3/uL (ref 1.7–7.7)
Platelets: 327 10*3/uL (ref 150–400)
RBC: 5.13 MIL/uL (ref 4.22–5.81)
RDW: 14 % (ref 11.5–15.5)
WBC: 5 10*3/uL (ref 4.0–10.5)

## 2014-06-03 LAB — HEPATITIS A ANTIBODY, TOTAL: Hep A Total Ab: REACTIVE — AB

## 2014-06-03 LAB — HIV ANTIBODY (ROUTINE TESTING W REFLEX): HIV 1&2 Ab, 4th Generation: NONREACTIVE

## 2014-06-03 LAB — HEPATITIS B SURFACE ANTIGEN: Hepatitis B Surface Ag: NEGATIVE

## 2014-06-03 LAB — HEPATITIS B CORE ANTIBODY, TOTAL: HEP B C TOTAL AB: NONREACTIVE

## 2014-06-03 LAB — HEPATITIS B SURFACE ANTIBODY,QUALITATIVE: Hep B S Ab: NEGATIVE

## 2014-06-03 LAB — PROTIME-INR
INR: 1.1 (ref <1.50)
PROTHROMBIN TIME: 14.2 s (ref 11.6–15.2)

## 2014-06-03 LAB — IRON: IRON: 110 ug/dL (ref 42–165)

## 2014-06-03 NOTE — Progress Notes (Signed)
+[name] is a 68 y.o. male who presents for initial evaluation and management of a positive Hepatitis C antibody test.  Patient tested positive earlier this year. Hepatitis C risk factors present are: none. Patient denies accidental needle stick, history of blood transfusion, history of clotting factor transfusion, intranasal drug use, IV drug abuse, multiple sexual partners, renal dialysis, sexual contact with person with liver disease, tattoos. Patient has had other studies performed. Results: hepatitis C RNA by PCR, result: positive. Patient has not had prior treatment for Hepatitis C. Patient does not have a past history of liver disease. Patient does not have a family history of liver disease.   HPI: He is unsure of how he could have become infected.  Interested in treatment.    Patient does not have documented immunity to Hepatitis A. Patient does not have documented immunity to Hepatitis B.     Review of Systems A comprehensive review of systems was negative.   Past Medical History  Diagnosis Date  . Sarcoidosis   . Hx of nonmelanoma skin cancer   . Upper GI bleeding     2013 upper GI bleed   . Hepatitis C reactive     Prior to Admission medications   Medication Sig Start Date End Date Taking? Authorizing Provider  HYDROcodone-acetaminophen (NORCO) 7.5-325 MG per tablet Take 1 tablet by mouth as needed.    Historical Provider, MD  predniSONE (DELTASONE) 10 MG tablet Take 1.5 tablets daily 01/28/14   Kathee Delton, MD  PROVENTIL HFA 108 (90 BASE) MCG/ACT inhaler Inhale 2 puffs into the lungs 4 (four) times daily as needed. 10/09/13   Historical Provider, MD  tamsulosin (FLOMAX) 0.4 MG CAPS capsule Take 1 capsule by mouth daily. 10/07/13   Historical Provider, MD    Allergies  Allergen Reactions  . Penicillins Other (See Comments)    Unknown reaction    History  Substance Use Topics  . Smoking status: Never Smoker   . Smokeless tobacco: Never Used  . Alcohol Use: No     Family History  Problem Relation Age of Onset  . Hypertension Mother   . Heart failure Father   . Cancer - Prostate Brother       Objective:   Filed Vitals:   06/03/14 0908  BP: 124/75  Pulse: 71  Temp: 97.7 F (36.5 C)   in no apparent distress and alert HEENT: anicteric Cor RRR and No murmurs clear Bowel sounds are normal, liver is not enlarged, spleen is not enlarged peripheral pulses normal, no pedal edema, no clubbing or cyanosis negative for - jaundice, spider hemangioma, telangiectasia, palmar erythema, ecchymosis and atrophy  Laboratory Genotype:  Lab Results  Component Value Date   HCVGENOTYPE 1a 11/12/2013   HCV viral load:  Lab Results  Component Value Date   HCVQUANT [id number]* 11/12/2013   Lab Results  Component Value Date   WBC 5.0 08/29/2013   HGB 13.7 08/29/2013   HCT 38.9* 08/29/2013   MCV 84.7 08/29/2013   PLT 255 08/29/2013    Lab Results  Component Value Date   CREATININE 0.69 08/29/2013   BUN 15 08/29/2013   NA 138 08/29/2013   K 3.9 08/29/2013   CL 104 08/29/2013   CO2 25 08/29/2013    Lab Results  Component Value Date   ALT 26 08/29/2013   AST 43* 08/29/2013   ALKPHOS 58 08/29/2013   BILITOT 0.7 08/29/2013   INR 1.3* 09/07/2013      Assessment: Hepatitis C  genotype 1a  Plan: 1) Patient counseled extensively on limiting acetaminophen to no more than 2 grams daily, avoidance of alcohol. 2) Transmission discussed with patient including sexual transmission, sharing razors and toothbrush.   3) Will need referral to gastroenterology if concern for cirrhosis 4) Will need referral for substance abuse counseling: No. 5) Will prescribe [name] for 12 weeks once work up complete 6) Hepatitis A vaccine No. will check titers 7) Hepatitis B vaccine No. 8) Pneumovax vaccine if concern for cirrhosis 9) will follow up after starting medication or in 1 year if denied

## 2014-06-03 NOTE — Patient Instructions (Signed)
Date 06/03/2014  Dear Mr Vera, As discussed in the [facility], your hepatitis C therapy will include the following medications:          [name] 90mg /400mg  tablet:           Take 1 tablet by mouth once daily   Please note that ALL MEDICATIONS WILL START ON THE SAME DATE for a total of 12 weeks. ---------------------------------------------------------------- Your HCV Treatment Start Date: TBA   Your HCV genotype:  1a    Liver Fibrosis: TBD    ---------------------------------------------------------------- YOUR PHARMACY CONTACT:   [place] [facility] [place] Phone: [phone] Hours: Monday to Friday 7:30 am to 6:00 pm   Please always contact your pharmacy at least 3-4 business days before you run out of medications to ensure your next month's medication is ready or 1 week prior to running out if you receive it by mail.  Remember, each prescription is for 28 days. ---------------------------------------------------------------- GENERAL NOTES REGARDING YOUR HEPATITIS C MEDICATION:  SOFOSBUVIR/LEDIPASVIR ([name]): - [name] tablet is taken daily with OR without food. - The tablets are orange. - The tablets should be stored at room temperature.  - Acid reducing agents such as H2 blockers (ie. Pepcid (famotidine), Zantac (ranitidine), Tagamet (cimetidine), Axid (nizatidine) and proton pump inhibitors (ie. Prilosec (omeprazole), Protonix (pantoprazole), Nexium (esomeprazole), or Aciphex (rabeprazole)) can decrease effectiveness of [name]. Do not take until you have discussed with a health care provider.    -Antacids that contain magnesium and/or aluminum hydroxide (ie. Milk of Magensia, Rolaids, Gaviscon, Maalox, Mylanta, an dArthritis Pain Formula)can reduce absorption of [name], so take them at least 4 hours before or after [name].  -Calcium carbonate (calcium supplements or antacids such as Tums, Caltrate,  Os-Cal)needs to be taken at least 4 hours hours before or after [name].  -St. John's wort or any products that contain St. John's wort like some herbal supplements  Please inform the office prior to starting any of these medications.  - The common side effects with [name]:      1. Fatigue      2. Headache      3. Nausea      4. Diarrhea      5. Insomnia   Support Path is a suite of resources designed to help patients start with [name] and move toward treatment completion [place] helps patients access therapy and get off to an efficient start  Benefits investigation and prior authorization support Co-pay and other financial assistance A specialty pharmacy finder CO-PAY COUPON The [place] co-pay coupon may help eligible patients lower their out-of-pocket costs. With a co-pay coupon, most eligible patients may pay no more than $5 per co-pay (restrictions apply) www.harvoni.com call 3364873548 Not valid for patients enrolled in government healthcare prescription drug programs, such as Medicare Part D and Medicaid. Patients in the coverage gap known as the "donut hole" also are not eligible The [name] co-pay coupon program will cover the out-of-pocket costs for [name] prescriptions up to a maximum of 25% of the catalog price of a 12-week regimen of [name]  Please note that this only lists the most common side effects and is NOT a comprehensive list of the potential side effects of these medications. For more information, please review the drug information sheets that come with your medication package from the pharmacy.  ---------------------------------------------------------------- GENERAL HELPFUL HINTS ON HCV THERAPY: 1. No alcohol. 2. Protect against sun-sensitivity/sunburns (wear sunglasses, hat, long sleeves, pants  and sunscreen). 3. Stay well-hydrated/well-moisturized. 4. Notify the [facility] of any changes in your other over-the-counter/herbal or  prescription medications. 5. If you miss a dose of your medication, take the missed dose as soon as you remember. Return to your regular time/dose schedule the next day.  6.  Do not stop taking your medications without first talking with your healthcare provider. 7.  You may take Tylenol (acetaminophen), as long as the dose is less than 2000 mg (OR no more than 4 tablets of the Tylenol Extra Strengths 500mg  tablet) in 24 hours. 8.  You will need to obtain routine labs and/or office visits at [facility] at weeks 2, 4, 8,  and 12 as well as 12 and 24 weeks after completion of treatment.   Scharlene Gloss, [place] for [place], [place]  [id number] [phone]

## 2014-06-04 LAB — HEPATITIS C RNA QUANTITATIVE
HCV QUANT LOG: 6.08 {Log} — AB (ref <1.18)
HCV QUANT: 1202181 [IU]/mL — AB (ref <15)

## 2014-06-04 LAB — ANA: Anti Nuclear Antibody(ANA): NEGATIVE

## 2014-06-15 ENCOUNTER — Ambulatory Visit (HOSPITAL_COMMUNITY)
Admission: RE | Admit: 2014-06-15 | Discharge: 2014-06-15 | Disposition: A | Discharge status: Home / Self Care | Payer: Commercial Managed Care - HMO | Source: Ambulatory Visit | Attending: Internal Medicine | Admitting: Internal Medicine

## 2014-06-15 ENCOUNTER — Other Ambulatory Visit: Payer: Self-pay | Admitting: Internal Medicine

## 2014-06-15 ENCOUNTER — Telehealth: Payer: Self-pay | Admitting: Pulmonary Disease

## 2014-06-15 DIAGNOSIS — B1920 Unspecified viral hepatitis C without hepatic coma: Secondary | ICD-10-CM | POA: Insufficient documentation

## 2014-06-15 DIAGNOSIS — B182 Chronic viral hepatitis C: Secondary | ICD-10-CM

## 2014-06-15 MED ORDER — LEDIPASVIR-SOFOSBUVIR 90-400 MG PO TABS: 1.0000 | ORAL_TABLET | Freq: Every day | ORAL | Status: DC

## 2014-06-15 NOTE — Telephone Encounter (Signed)
Per 11/20/13 OV w/ [facility]: Patient Instructions       Will change your prednisone to 10mg  tabs, and take 2 1/2 each day for one week, then 2 each day for one week, then 1 1/2 each day and stay there.     --  LMOMTCB x1 for pt Pt does not have pending appt

## 2014-06-16 MED ORDER — PREDNISONE 10 MG PO TABS: ORAL_TABLET | ORAL | Status: DC

## 2014-06-16 NOTE — Telephone Encounter (Signed)
LMTCB

## 2014-06-16 NOTE — Telephone Encounter (Signed)
Called and spoke to pt. Pt requesting refill for pred. Informed pt to stay at recommended dose per [place] at last OV. Rx sent and appt made with [facility] on 06/25/14 at 12p. Pt verbalized understanding and denied any further questions or concerns at this time.

## 2014-06-16 NOTE — Telephone Encounter (Signed)
Pt calling back again [phone]

## 2014-06-21 ENCOUNTER — Telehealth: Payer: Self-pay | Admitting: *Deleted

## 2014-06-21 NOTE — Telephone Encounter (Signed)
Called and left a message for [name] to return my call concerning [name].

## 2014-06-25 ENCOUNTER — Encounter: Payer: Self-pay | Admitting: Pulmonary Disease

## 2014-06-25 ENCOUNTER — Ambulatory Visit (INDEPENDENT_AMBULATORY_CARE_PROVIDER_SITE_OTHER)
Admission: RE | Admit: 2014-06-25 | Discharge: 2014-06-25 | Disposition: A | Discharge status: Home / Self Care | Payer: Commercial Managed Care - HMO | Source: Ambulatory Visit | Attending: Pulmonary Disease | Admitting: Pulmonary Disease

## 2014-06-25 ENCOUNTER — Ambulatory Visit (INDEPENDENT_AMBULATORY_CARE_PROVIDER_SITE_OTHER): Payer: Commercial Managed Care - HMO | Admitting: Pulmonary Disease

## 2014-06-25 VITALS — BP 132/78 | HR 66 | Temp 97.0°F | Ht 71.0 in | Wt 193.0 lb

## 2014-06-25 DIAGNOSIS — D869 Sarcoidosis, unspecified: Secondary | ICD-10-CM

## 2014-06-25 DIAGNOSIS — R222 Localized swelling, mass and lump, trunk: Secondary | ICD-10-CM

## 2014-06-25 DIAGNOSIS — R918 Other nonspecific abnormal finding of lung field: Secondary | ICD-10-CM

## 2014-06-25 MED ORDER — FLUTICASONE FUROATE-VILANTEROL 100-25 MCG/INH IN AEPB: 1.0000 | INHALATION_SPRAY | Freq: Every day | RESPIRATORY_TRACT | Status: DC

## 2014-06-25 NOTE — Progress Notes (Signed)
   Subjective:    Patient ID: [name], male    DOB: Oct 13, 1945, 68 y.o.   MRN: [id number]  HPI The patient comes in today for follow-up of his known sarcoidosis. He also has an endobronchial abnormality that we have been following closely, with only inflammation on 2 different bronchoscopies. The patient saw a definite improvement in his breathing and airway symptoms with higher doses of prednisone, but is now down to 15 mg a day. He is now having a little more cough and dyspnea, and is describing classic upper airway pseudo wheezing. Overall, he feels that he is much better than earlier in the year.   Review of Systems  Constitutional: Negative for fever and unexpected weight change.  HENT: Negative for congestion, dental problem, ear pain, nosebleeds, postnasal drip, rhinorrhea, sinus pressure, sneezing, sore throat and trouble swallowing.   Eyes: Negative for redness and itching.  Respiratory: Positive for cough, chest tightness, shortness of breath and wheezing.   Cardiovascular: Negative for palpitations and leg swelling.  Gastrointestinal: Negative for nausea and vomiting.  Genitourinary: Negative for dysuria.  Musculoskeletal: Negative for joint swelling.  Skin: Negative for rash.  Neurological: Negative for headaches.  Hematological: Does not bruise/bleed easily.  Psychiatric/Behavioral: Negative for dysphoric mood. The patient is not nervous/anxious.        Objective:   Physical Exam Well-developed male in no acute distress Nose without purulence or discharge noted Neck without lymphadenopathy or thyromegaly Chest with a few basilar crackles, otherwise clear Cardiac exam with regular rate and rhythm Lower extremities without edema, no cyanosis Alert and oriented, moves all 4 extremities.       Assessment & Plan:

## 2014-06-25 NOTE — Assessment & Plan Note (Signed)
The patient has a history of sarcoidosis by biopsy, and feels that he had a significant response to the higher doses of prednisone. He is now down to 15 mg a day, and although he still is doing fairly well, he has noticed some increased cough and shortness of breath. I would like to continue tapering his prednisone extremely slowly, and start him on a LABA/ICS to help with cough and other airway symptoms. If doing well at the next visit, I will continue to decrease his prednisone. Hopefully, we can avoid increasing to the higher doses.

## 2014-06-25 NOTE — Assessment & Plan Note (Signed)
The patient has a history of an endobronchial abnormality in the posterior basilar segment of the right lower lobe, along with intermittent hemoptysis. Biopsies have shown only inflammatory changes, and he has had no further hemoptysis since being treated with steroids. I think we need to continue to keep a close eye on this, and we'll check a chest x-ray today.

## 2014-06-25 NOTE — Patient Instructions (Signed)
Decrease prednisone to 10mg  each am (one tablet each morning) Start on breo 100, one inhalation each am.  Rinse mouth well.  Hopefully this will help cough.  Will call in prescription for this as well.  Will check chest xray today, and will call you with results.  followup with me again in 46mos

## 2014-07-07 ENCOUNTER — Telehealth: Payer: Self-pay | Admitting: Pulmonary Disease

## 2014-07-07 MED ORDER — AZITHROMYCIN 250 MG PO TABS
ORAL_TABLET | ORAL | Status: DC
Start: 2014-07-07 — End: 2014-08-03

## 2014-07-07 NOTE — Telephone Encounter (Signed)
Called and spoke to pt. Informed pt of the recs per [facility]. Pt verbalized understanding. Pt stated he is taking [name] and wanted to be sure there aren't any contraindications with the zpak. Med list updated. Called and spoke to pharmacist at [facility]. Pharmacist stated there is a low risk for a [name] serum concentration and pt should be fine taking the medication. [place] aware. Pt aware. Rx sent. Nothing further needed.

## 2014-07-07 NOTE — Telephone Encounter (Signed)
Pt c/o prod cough with thick white mucus X1 day, chest pain when coughing, unsure if he is febrile but has cold sweats.  Denies sob, sinus congestion.  Is requesting recs.  Dr. Gwenette Greet please advise.  Thanks!

## 2014-07-07 NOTE — Telephone Encounter (Signed)
Can treat with an abx in the event this represents developing bronchitis. Zpak, mucinex if needed for congestion/mucus Let us know if not getting better.

## 2014-07-09 ENCOUNTER — Encounter (HOSPITAL_COMMUNITY): Payer: Self-pay

## 2014-07-09 ENCOUNTER — Emergency Department (HOSPITAL_COMMUNITY): Payer: Commercial Managed Care - HMO

## 2014-07-09 ENCOUNTER — Emergency Department (HOSPITAL_COMMUNITY)
Admission: EM | Admit: 2014-07-09 | Discharge: 2014-07-09 | Disposition: A | Discharge status: Home / Self Care | Payer: Commercial Managed Care - HMO | Attending: Emergency Medicine | Admitting: Emergency Medicine

## 2014-07-09 DIAGNOSIS — Z862 Personal history of diseases of the blood and blood-forming organs and certain disorders involving the immune mechanism: Secondary | ICD-10-CM | POA: Insufficient documentation

## 2014-07-09 DIAGNOSIS — Z8719 Personal history of other diseases of the digestive system: Secondary | ICD-10-CM | POA: Insufficient documentation

## 2014-07-09 DIAGNOSIS — Z880 Allergy status to penicillin: Secondary | ICD-10-CM | POA: Diagnosis not present

## 2014-07-09 DIAGNOSIS — Z7952 Long term (current) use of systemic steroids: Secondary | ICD-10-CM | POA: Diagnosis not present

## 2014-07-09 DIAGNOSIS — R05 Cough: Secondary | ICD-10-CM | POA: Diagnosis present

## 2014-07-09 DIAGNOSIS — Z79899 Other long term (current) drug therapy: Secondary | ICD-10-CM | POA: Diagnosis not present

## 2014-07-09 DIAGNOSIS — Z7951 Long term (current) use of inhaled steroids: Secondary | ICD-10-CM | POA: Diagnosis not present

## 2014-07-09 DIAGNOSIS — R51 Headache: Secondary | ICD-10-CM | POA: Insufficient documentation

## 2014-07-09 DIAGNOSIS — Z85828 Personal history of other malignant neoplasm of skin: Secondary | ICD-10-CM | POA: Diagnosis not present

## 2014-07-09 DIAGNOSIS — R0789 Other chest pain: Secondary | ICD-10-CM | POA: Diagnosis not present

## 2014-07-09 DIAGNOSIS — R059 Cough, unspecified: Secondary | ICD-10-CM

## 2014-07-09 DIAGNOSIS — R519 Headache, unspecified: Secondary | ICD-10-CM

## 2014-07-09 MED ORDER — IPRATROPIUM-ALBUTEROL 0.5-2.5 (3) MG/3ML IN SOLN
3.0000 mL | RESPIRATORY_TRACT | Status: DC
  Administered 2014-07-09: 3 mL via RESPIRATORY_TRACT
  Filled 2014-07-09: qty 3

## 2014-07-09 MED ORDER — ALBUTEROL SULFATE HFA 108 (90 BASE) MCG/ACT IN AERS
2.0000 | INHALATION_SPRAY | Freq: Once | RESPIRATORY_TRACT | Status: AC
  Administered 2014-07-09: 2 via RESPIRATORY_TRACT
  Filled 2014-07-09: qty 6.7

## 2014-07-09 NOTE — ED Notes (Addendum)
Pt c/o chest congestion, productive cough, headache, and increased weakness x "a bout a week."  Pains score 8/10.  Pt reports Pulmonologist prescribed an antibiotic x 2 days ago, but it has provided no relief.  Hx of sarcoidosis.

## 2014-07-09 NOTE — ED Provider Notes (Signed)
Medical screening examination/treatment/procedure(s) were conducted as a shared visit with non-physician practitioner(s) and myself.  I personally evaluated the patient during the encounter.   EKG Interpretation None     Patient here with cough and associated headache. Neurological findings noted. Chest x-ray without new findings. We'll treat for likely bronchospasm and patient encouraged to follow-up with his pulmonologist  Leota Jacobsen, MD 07/09/14 1122

## 2014-07-09 NOTE — ED Notes (Signed)
Pt states HA and Productive cough.  Pt sounds diminished after the duoneb.  WIll get the Albuterol HFA and send him on his way.

## 2014-07-09 NOTE — Discharge Instructions (Signed)
Please follow-up with your pulmonologist at [facility] for further evaluation and management of your cough symptoms. Continue taking her antibiotic as previously prescribed. Return to ED for worsening symptoms. Please use your albuterol inhaler for any shortness of breath or difficulty breathing he may experience.

## 2014-07-09 NOTE — ED Notes (Signed)
Bed: WA04 Expected date:  Expected time:  Means of arrival:  Comments: 

## 2014-07-09 NOTE — ED Provider Notes (Signed)
CSN: [id number]     Arrival date & time 07/09/14  0940 History   First MD Initiated Contact with Patient 07/09/14 1038     Chief Complaint  Patient presents with  . Chest Congestion   . Cough  . Headache     (Consider location/radiation/quality/duration/timing/severity/associated sxs/prior Treatment) HPI [name] is a 68 y.o. male with a history of hepatitis C, sarcoidosis comes in for evaluation of productive cough, chest congestion and headache. Patient states since he began taking his [name] 6 days ago, he has experienced a mild stabbing headache that is intermittent. He has not tried anything to relieve his headache at this time. He also complains of a chest congestion with productive white sputum for the past week. He was evaluated by his pulmonologist at [facility], was given antibiotics and an inhaler. They follow him closely for his sarcoidosis. He was unable to reach their office today due to the holiday, so he came to the ED for further evaluation. He denies any discomfort at this time other than his headache. Patient denies any fevers at home, hemoptysis, chest pain, shortness of breath, unilateral leg swelling  Past Medical History  Diagnosis Date  . Sarcoidosis   . Hx of nonmelanoma skin cancer   . Upper GI bleeding     2013 upper GI bleed   . Hepatitis C reactive    Past Surgical History  Procedure Laterality Date  . Cancer on neck  2010    back of neck  . Hernia repair  1982  . Video bronchoscopy Bilateral 09/15/2013    Procedure: VIDEO BRONCHOSCOPY WITH FLUORO;  Surgeon: Kathee Delton, MD;  Location: [facility];  Service: Cardiopulmonary;  Laterality: Bilateral;  . Video bronchoscopy Bilateral 12/16/2013    Procedure: VIDEO BRONCHOSCOPY WITHOUT FLUORO;  Surgeon: Kathee Delton, MD;  Location: [facility];  Service: Cardiopulmonary;  Laterality: Bilateral;   Family History  Problem Relation Age of Onset  . Hypertension Mother   . Heart failure Father   . Cancer  - Prostate Brother    History  Substance Use Topics  . Smoking status: Never Smoker   . Smokeless tobacco: Never Used  . Alcohol Use: No    Review of Systems  Constitutional: Negative for fever.  HENT: Negative for sore throat.   Eyes: Negative for visual disturbance.  Respiratory: Positive for cough. Negative for shortness of breath.   Cardiovascular: Negative for chest pain and leg swelling.  Gastrointestinal: Negative for abdominal pain.  Endocrine: Negative for polyuria.  Genitourinary: Negative for dysuria.  Skin: Negative for rash.  Neurological: Positive for headaches.      Allergies  Penicillins  Home Medications   Prior to Admission medications   Medication Sig Start Date End Date Taking? Authorizing Provider  azithromycin (ZITHROMAX) 250 MG tablet Take 250 mg by mouth daily. Take 2 Tablets (500 mg) By Mouth Today (Tuesday) Then Take 1 Tablet Daily For 4 Days.   Yes Historical Provider, MD  COMBIGAN 0.2-0.5 % ophthalmic solution Place 1 drop into both eyes 2 (two) times daily. As directed 06/19/14  Yes Historical Provider, MD  Dextromethorphan-Guaifenesin (TUSSIN COUGH DM PO) Take 10 mLs by mouth every 4 (four) hours as needed (mucus congestion).   Yes Historical Provider, MD  Fluticasone Furoate-Vilanterol (BREO ELLIPTA) 100-25 MCG/INH AEPB Inhale 1 puff into the lungs daily. 06/25/14  Yes Kathee Delton, MD  latanoprost (XALATAN) 0.005 % ophthalmic solution Place 1 drop into both eyes at bedtime. As directed 06/19/14  Yes Historical Provider, MD  Ledipasvir-Sofosbuvir ([name]) 90-400 MG TABS Take 1 tablet by mouth at bedtime.    Yes Historical Provider, MD  predniSONE (DELTASONE) 10 MG tablet Take 1.5 tablets daily Patient taking differently: Take 10 mg by mouth daily with breakfast.  06/16/14  Yes Kathee Delton, MD  azithromycin (ZITHROMAX Z-PAK) 250 MG tablet Take as directed. Patient not taking: Reported on 07/09/2014 07/07/14   Kathee Delton, MD   BP 130/78  mmHg  Pulse 65  Temp(Src) 98.5 F (36.9 C) (Oral)  Resp 20  SpO2 100% Physical Exam  Constitutional: He is oriented to person, place, and time. He appears well-developed and well-nourished.  HENT:  Head: Normocephalic and atraumatic.  Mouth/Throat: Oropharynx is clear and moist.  Eyes: Conjunctivae are normal. Pupils are equal, round, and reactive to light. Right eye exhibits no discharge. Left eye exhibits no discharge. No scleral icterus.  Neck: Normal range of motion. Neck supple. No tracheal deviation present. No thyromegaly present.  No meningismus  Cardiovascular: Normal rate, regular rhythm and normal heart sounds.   Patient is not tachycardic on my exam  Pulmonary/Chest: Effort normal and breath sounds normal. No stridor. No respiratory distress. He has no wheezes. He has no rales.  Abdominal: Soft. There is no tenderness.  Musculoskeletal: Normal range of motion. He exhibits no edema or tenderness.  No tenderness along the venous system. Negative Homans sign. No erythema, tightness or overt warmth to bilateral lower extremities.  Neurological: He is alert and oriented to person, place, and time.  Cranial Nerves II-XII grossly intact.motor and sensation 5/5 in all 4 extremities. Coordination intact. Gait appears baseline with no appreciable ataxia. No nystagmus  Skin: Skin is warm and dry. No rash noted.  Psychiatric: He has a normal mood and affect.  Nursing note and vitals reviewed.   ED Course  Procedures (including critical care time) Labs Review Labs Reviewed - No data to display  Imaging Review Dg Chest 2 View (if Patient Has Fever And/or Copd)  07/09/2014   CLINICAL DATA:  Cough, congestion shortness of breath report of 4-5 days. History of sarcoidosis.  EXAM: CHEST  2 VIEW  COMPARISON:  PA and lateral 08/29/2013 and 06/25/2014.  FINDINGS: Chronic interstitial change related sarcoidosis is again seen. Hilar or mediastinal lymphadenopathy is better demonstrated on the  comparison CT scan. There is no new airspace disease, pleural effusion pneumothorax. Heart size is normal.  IMPRESSION: Chronic changes of sarcoidosis.  No acute disease.   Electronically Signed   By: Inge Rise M.D.   On: 07/09/2014 10:24     EKG Interpretation None     Meds given in ED:  Medications  ipratropium-albuterol (DUONEB) 0.5-2.5 (3) MG/3ML nebulizer solution 3 mL (3 mLs Nebulization Given 07/09/14 1123)  albuterol (PROVENTIL HFA;VENTOLIN HFA) 108 (90 BASE) MCG/ACT inhaler 2 puff (2 puffs Inhalation Given 07/09/14 1158)    Discharge Medication List as of 07/09/2014 11:39 AM     Filed Vitals:   07/09/14 0945 07/09/14 1124 07/09/14 1201  BP: 123/86  130/78  Pulse: 101  65  Temp: 98.9 F (37.2 C)  98.5 F (36.9 C)  TempSrc: Oral  Oral  Resp: 16  20  SpO2: 95% 95% 100%    MDM  Vitals stable - WNL -afebrile Pt resting comfortably in ED. Headache has improved spontaneously since being in the ED. PE--not concerning further acute or emergent pathology. No evidence of respiratory distress. Benign lung exam. Normal neuro exam. No meningismus  Imaging--chest x-ray  shows stable sarcoidosis DDX--low concern for acute or emergent pathology.  Will DC with albuterol inhaler for patient comfort and encourage follow-up with his pulmonologist at [facility], continue taking antibiotic regimen as previously prescribed.. Discussed f/u with PCP and return precautions, pt very amenable to plan.  Prior to patient discharge, I discussed and reviewed this case with Dr. Zenia Resides, who also saw and evaluated the patient  Final diagnoses:  Cough  Acute nonintractable headache, unspecified headache type        Verl Dicker, PA-C 07/09/14 1220

## 2014-07-20 ENCOUNTER — Telehealth: Payer: Self-pay | Admitting: Pulmonary Disease

## 2014-07-20 ENCOUNTER — Other Ambulatory Visit: Payer: Self-pay | Admitting: Pulmonary Disease

## 2014-07-20 NOTE — Telephone Encounter (Signed)
I have sent this refill in earlier. Just received request today.  Called pt and LMTCB x1

## 2014-07-21 NOTE — Telephone Encounter (Signed)
Called [facility] and spoke with pharmacist Antony Haste - pt has yet to pick up his prednisone rx Called left detailed message on named voicemail informing pt that his prednisone was refilled yesterday and is ready for him to pick up at his convenience.  Advised pt to call back if we can assist him further.  Will go ahead and sign off on message.

## 2014-07-28 ENCOUNTER — Other Ambulatory Visit: Payer: Commercial Managed Care - HMO

## 2014-07-28 DIAGNOSIS — B182 Chronic viral hepatitis C: Secondary | ICD-10-CM | POA: Diagnosis not present

## 2014-07-28 LAB — COMPREHENSIVE METABOLIC PANEL
ALT: 9 U/L (ref 0–53)
AST: 18 U/L (ref 0–37)
Albumin: 3.2 g/dL — ABNORMAL LOW (ref 3.5–5.2)
Alkaline Phosphatase: 43 U/L (ref 39–117)
BUN: 16 mg/dL (ref 6–23)
CALCIUM: 9.3 mg/dL (ref 8.4–10.5)
CHLORIDE: 101 meq/L (ref 96–112)
CO2: 29 mEq/L (ref 19–32)
CREATININE: 0.77 mg/dL (ref 0.50–1.35)
GLUCOSE: 83 mg/dL (ref 70–99)
Potassium: 3.9 mEq/L (ref 3.5–5.3)
SODIUM: 137 meq/L (ref 135–145)
Total Bilirubin: 0.9 mg/dL (ref 0.2–1.2)
Total Protein: 8.3 g/dL (ref 6.0–8.3)

## 2014-07-28 LAB — CBC
HCT: 44.6 % (ref 39.0–52.0)
Hemoglobin: 15.7 g/dL (ref 13.0–17.0)
MCH: 30.3 pg (ref 26.0–34.0)
MCHC: 35.2 g/dL (ref 30.0–36.0)
MCV: 86.1 fL (ref 78.0–100.0)
MPV: 10.1 fL (ref 8.6–12.4)
Platelets: 267 10*3/uL (ref 150–400)
RBC: 5.18 MIL/uL (ref 4.22–5.81)
RDW: 14.4 % (ref 11.5–15.5)
WBC: 6 10*3/uL (ref 4.0–10.5)

## 2014-07-29 LAB — HEPATITIS C RNA QUANTITATIVE: HCV QUANT: NOT DETECTED [IU]/mL (ref <15)

## 2014-08-03 ENCOUNTER — Ambulatory Visit (INDEPENDENT_AMBULATORY_CARE_PROVIDER_SITE_OTHER): Payer: Commercial Managed Care - HMO | Admitting: Internal Medicine

## 2014-08-03 ENCOUNTER — Encounter: Payer: Self-pay | Admitting: Internal Medicine

## 2014-08-03 VITALS — BP 149/92 | HR 58 | Temp 98.2°F | Wt 194.0 lb

## 2014-08-03 DIAGNOSIS — B182 Chronic viral hepatitis C: Secondary | ICD-10-CM

## 2014-08-03 DIAGNOSIS — Z23 Encounter for immunization: Secondary | ICD-10-CM | POA: Diagnosis not present

## 2014-08-03 NOTE — Addendum Note (Signed)
Addended by: Myrtis Hopping A on: 08/03/2014 03:58 PM   Modules accepted: Orders

## 2014-08-03 NOTE — Assessment & Plan Note (Signed)
He is doing great. Undetectable now and very good tolerance and in fact feels better. He will complete the 12 weeks and I'll check his lab at the completion and follow-up with him 1 week after the lab.

## 2014-08-03 NOTE — Progress Notes (Signed)
   Subjective:    Patient ID: [name], male    DOB: 25-Jun-1946, 69 y.o.   MRN: [id number]  HPI He is here for follow-up of hepatitis C. He has genotype 1A, with an initial viral load of 1 million.  His elastography is consistent with F0 to F1. He is hepatitis A immune and hepatitis B nonimmune. He started [name] about one month ago and his initial RVR is undetectable. His AST has also normalized. No issues.  He notes he actually feels much better, more energized.     Review of Systems  Constitutional: Negative for fatigue and unexpected weight change.  Gastrointestinal: Negative for abdominal distention.  Skin: Negative for rash.  Neurological: Negative for dizziness, light-headedness and headaches.       Objective:   Physical Exam  Constitutional: He appears well-developed and well-nourished. No distress.  Eyes: No scleral icterus.  Cardiovascular: Normal rate, regular rhythm and normal heart sounds.   No murmur heard. Pulmonary/Chest: Effort normal and breath sounds normal. No respiratory distress.  Skin: No rash noted.          Assessment & Plan:

## 2014-08-04 ENCOUNTER — Ambulatory Visit (INDEPENDENT_AMBULATORY_CARE_PROVIDER_SITE_OTHER): Payer: Commercial Managed Care - HMO | Admitting: Internal Medicine

## 2014-08-04 DIAGNOSIS — B182 Chronic viral hepatitis C: Secondary | ICD-10-CM

## 2014-08-09 NOTE — Progress Notes (Signed)
Cancelled.  

## 2014-08-19 ENCOUNTER — Telehealth: Payer: Self-pay | Admitting: Pulmonary Disease

## 2014-08-19 MED ORDER — PREDNISONE 10 MG PO TABS: 10.0000 mg | ORAL_TABLET | Freq: Every day | ORAL | Status: DC

## 2014-08-19 NOTE — Telephone Encounter (Signed)
Patient Instructions       Decrease prednisone to 10mg  each am (one tablet each morning) Start on breo 100, one inhalation each am.  Rinse mouth well.  Hopefully this will help cough.  Will call in prescription for this as well.   Will check chest xray today, and will call you with results.   followup with me again in 73mos   Called pt and aware RX sent in. Nothing further needed

## 2014-09-15 ENCOUNTER — Other Ambulatory Visit: Payer: Self-pay | Admitting: Pulmonary Disease

## 2014-09-24 ENCOUNTER — Ambulatory Visit: Payer: Commercial Managed Care - HMO | Admitting: Pulmonary Disease

## 2014-09-28 ENCOUNTER — Other Ambulatory Visit: Payer: Commercial Managed Care - HMO

## 2014-09-28 DIAGNOSIS — B182 Chronic viral hepatitis C: Secondary | ICD-10-CM | POA: Diagnosis not present

## 2014-10-05 ENCOUNTER — Encounter: Payer: Self-pay | Admitting: Internal Medicine

## 2014-10-05 ENCOUNTER — Ambulatory Visit (INDEPENDENT_AMBULATORY_CARE_PROVIDER_SITE_OTHER): Payer: Commercial Managed Care - HMO | Admitting: Internal Medicine

## 2014-10-05 ENCOUNTER — Encounter: Payer: Self-pay | Admitting: *Deleted

## 2014-10-05 VITALS — BP 158/92 | HR 64 | Temp 97.4°F | Ht 71.5 in | Wt 197.0 lb

## 2014-10-05 DIAGNOSIS — Z23 Encounter for immunization: Secondary | ICD-10-CM | POA: Diagnosis not present

## 2014-10-05 DIAGNOSIS — B182 Chronic viral hepatitis C: Secondary | ICD-10-CM | POA: Diagnosis not present

## 2014-10-05 LAB — HEPATITIS C RNA QUANTITATIVE: HCV QUANT: NOT DETECTED [IU]/mL (ref <15)

## 2014-10-05 NOTE — Progress Notes (Signed)
Patient ID: [name], male   DOB: [birth year], 69 y.o.   MRN: [id number] Patient will need an ultrasound scheduled 2nd week in May. Myrtis Hopping

## 2014-10-05 NOTE — Assessment & Plan Note (Signed)
He has now completed treatment and is doing well. His end of treatment lab is undetectable. He can return in 3 months for SVR 12 and I will see him after the lab.

## 2014-10-05 NOTE — Progress Notes (Signed)
   Subjective:    Patient ID: [name], male    DOB: [birth year], 69 y.o.   MRN: [id number]  HPI He is here for follow-up of hepatitis C. He has genotype 1A, with an initial viral load of 1 million.  His elastography is consistent with F0 to F1. He is hepatitis A immune and hepatitis B nonimmune. He finished [name] about 9 days ago and his initial RVR is undetectable and end of treatment undetectable. His AST has also normalized. No issues.     Review of Systems  Constitutional: Negative for fatigue and unexpected weight change.  Gastrointestinal: Negative for abdominal distention.  Skin: Negative for rash.  Neurological: Negative for dizziness, light-headedness and headaches.       Objective:   Physical Exam  Constitutional: He appears well-developed and well-nourished. No distress.  Eyes: No scleral icterus.  Cardiovascular: Normal rate, regular rhythm and normal heart sounds.   No murmur heard. Pulmonary/Chest: Effort normal and breath sounds normal. No respiratory distress.  Skin: No rash noted.          Assessment & Plan:

## 2014-10-13 ENCOUNTER — Encounter: Payer: Self-pay | Admitting: Pulmonary Disease

## 2014-10-13 ENCOUNTER — Ambulatory Visit (INDEPENDENT_AMBULATORY_CARE_PROVIDER_SITE_OTHER): Payer: Commercial Managed Care - HMO | Admitting: Pulmonary Disease

## 2014-10-13 VITALS — BP 114/62 | HR 76 | Temp 97.4°F | Ht 71.0 in | Wt 196.2 lb

## 2014-10-13 DIAGNOSIS — R918 Other nonspecific abnormal finding of lung field: Secondary | ICD-10-CM

## 2014-10-13 DIAGNOSIS — R222 Localized swelling, mass and lump, trunk: Secondary | ICD-10-CM

## 2014-10-13 DIAGNOSIS — D869 Sarcoidosis, unspecified: Secondary | ICD-10-CM | POA: Diagnosis not present

## 2014-10-13 MED ORDER — PREDNISONE 5 MG PO TABS: ORAL_TABLET | ORAL | Status: DC

## 2014-10-13 MED ORDER — FLUTICASONE FUROATE-VILANTEROL 100-25 MCG/INH IN AEPB: 1.0000 | INHALATION_SPRAY | Freq: Every day | RESPIRATORY_TRACT | Status: DC

## 2014-10-13 NOTE — Assessment & Plan Note (Signed)
The patient has done well on his chronic prednisone, and I would like to work toward discontinuing the medication. I would like for him to get on a LABA/ICS, and hopefully this will allow him to come completely off oral prednisone. I will see him back in approximately 4 months, but he knows to call if his breathing worsens as his prednisone is decreased and discontinued. Will also check a chest x-ray at the next visit.

## 2014-10-13 NOTE — Patient Instructions (Signed)
Get back on breo 100, and take one inhalation each day.  Will send in prescription as well. Decrease prednisone to 7.5mg  a day for 2 weeks, then 5mg  a day for 2 weeks, then 2.5mg  a day for 2 weeks, then stop.  Please call if your breathing worsens during prednisone taper or when off prednisone followup with me again in 39mos, with chest xray on the same day.

## 2014-10-13 NOTE — Progress Notes (Signed)
   Subjective:    Patient ID: [name], male    DOB: Oct 18, 1945, 69 y.o.   MRN: [id number]  HPI The patient comes in today for follow-up of his known pulmonary sarcoidosis. He had been on chronic prednisone for a period of time with significant improvement in his symptoms, but at the last visit I started him on a LABA/ICS to see if we could possibly get him off steroids in the near future. He took the samples, but never called for a prescription. He is currently on 10 mg of prednisone a day and feels that he is doing well.  He has chronic dyspnea on exertion, but feels that he is at his usual baseline. He denies any significant cough or purulence.   Review of Systems  Constitutional: Negative for fever and unexpected weight change.  HENT: Positive for congestion and postnasal drip. Negative for dental problem, ear pain, nosebleeds, rhinorrhea, sinus pressure, sneezing, sore throat and trouble swallowing.   Eyes: Negative for redness and itching.  Respiratory: Positive for cough, chest tightness, shortness of breath and wheezing.   Cardiovascular: Negative for palpitations and leg swelling.  Gastrointestinal: Negative for nausea and vomiting.  Genitourinary: Negative for dysuria.  Musculoskeletal: Negative for joint swelling.  Skin: Negative for rash.  Neurological: Negative for headaches.  Hematological: Does not bruise/bleed easily.  Psychiatric/Behavioral: Negative for dysphoric mood. The patient is not nervous/anxious.        Objective:   Physical Exam Well-developed male in no acute distress Nose without purulence or discharge noted Neck without lymphadenopathy or thyromegaly Chest totally clear to auscultation, no wheezing Cardiac exam with regular rate and rhythm Lower extremities without edema, no cyanosis Alert and oriented, moves all 4 extremities.       Assessment & Plan:

## 2014-11-12 ENCOUNTER — Telehealth: Payer: Self-pay | Admitting: *Deleted

## 2014-11-12 NOTE — Telephone Encounter (Signed)
Called patient and left a voice mail regarding ultrasound appt. Scheduled for 11/24/14 at 7:45 AM at [facility]. Nothing to eat or drink after midnight. Asked that patient call the clinic to confirm receiving this information. Myrtis Hopping CMA

## 2014-11-24 ENCOUNTER — Ambulatory Visit (HOSPITAL_COMMUNITY)
Admission: RE | Admit: 2014-11-24 | Discharge: 2014-11-24 | Disposition: A | Discharge status: Home / Self Care | Payer: Commercial Managed Care - HMO | Source: Ambulatory Visit | Attending: Internal Medicine | Admitting: Internal Medicine

## 2014-11-24 DIAGNOSIS — R894 Abnormal immunological findings in specimens from other organs, systems and tissues: Secondary | ICD-10-CM

## 2014-11-24 DIAGNOSIS — R938 Abnormal findings on diagnostic imaging of other specified body structures: Secondary | ICD-10-CM | POA: Insufficient documentation

## 2014-11-24 DIAGNOSIS — D8689 Sarcoidosis of other sites: Secondary | ICD-10-CM | POA: Diagnosis not present

## 2014-11-24 DIAGNOSIS — D869 Sarcoidosis, unspecified: Secondary | ICD-10-CM | POA: Diagnosis not present

## 2014-11-24 DIAGNOSIS — B1920 Unspecified viral hepatitis C without hepatic coma: Secondary | ICD-10-CM | POA: Insufficient documentation

## 2014-11-24 DIAGNOSIS — B1710 Acute hepatitis C without hepatic coma: Secondary | ICD-10-CM | POA: Diagnosis not present

## 2014-11-30 DIAGNOSIS — H4011X3 Primary open-angle glaucoma, severe stage: Secondary | ICD-10-CM | POA: Diagnosis not present

## 2014-12-20 ENCOUNTER — Other Ambulatory Visit: Payer: Commercial Managed Care - HMO

## 2014-12-20 DIAGNOSIS — B182 Chronic viral hepatitis C: Secondary | ICD-10-CM | POA: Diagnosis not present

## 2014-12-21 LAB — HEPATITIS C RNA QUANTITATIVE: HCV QUANT: NOT DETECTED [IU]/mL (ref <15)

## 2014-12-29 ENCOUNTER — Emergency Department (HOSPITAL_COMMUNITY): Payer: Commercial Managed Care - HMO

## 2014-12-29 ENCOUNTER — Emergency Department (HOSPITAL_COMMUNITY)
Admission: EM | Admit: 2014-12-29 | Discharge: 2014-12-29 | Disposition: A | Discharge status: Home / Self Care | Payer: Commercial Managed Care - HMO | Attending: Emergency Medicine | Admitting: Emergency Medicine

## 2014-12-29 ENCOUNTER — Ambulatory Visit: Payer: Medicare HMO | Admitting: Internal Medicine

## 2014-12-29 DIAGNOSIS — Z86718 Personal history of other venous thrombosis and embolism: Secondary | ICD-10-CM | POA: Insufficient documentation

## 2014-12-29 DIAGNOSIS — Z8619 Personal history of other infectious and parasitic diseases: Secondary | ICD-10-CM | POA: Insufficient documentation

## 2014-12-29 DIAGNOSIS — G8929 Other chronic pain: Secondary | ICD-10-CM | POA: Diagnosis not present

## 2014-12-29 DIAGNOSIS — R519 Headache, unspecified: Secondary | ICD-10-CM

## 2014-12-29 DIAGNOSIS — Z8719 Personal history of other diseases of the digestive system: Secondary | ICD-10-CM | POA: Insufficient documentation

## 2014-12-29 DIAGNOSIS — R062 Wheezing: Secondary | ICD-10-CM | POA: Diagnosis not present

## 2014-12-29 DIAGNOSIS — R51 Headache: Secondary | ICD-10-CM | POA: Diagnosis not present

## 2014-12-29 DIAGNOSIS — Z7952 Long term (current) use of systemic steroids: Secondary | ICD-10-CM | POA: Insufficient documentation

## 2014-12-29 DIAGNOSIS — Z85828 Personal history of other malignant neoplasm of skin: Secondary | ICD-10-CM | POA: Diagnosis not present

## 2014-12-29 DIAGNOSIS — Z880 Allergy status to penicillin: Secondary | ICD-10-CM | POA: Insufficient documentation

## 2014-12-29 DIAGNOSIS — Z79899 Other long term (current) drug therapy: Secondary | ICD-10-CM | POA: Insufficient documentation

## 2014-12-29 DIAGNOSIS — R05 Cough: Secondary | ICD-10-CM | POA: Diagnosis not present

## 2014-12-29 DIAGNOSIS — R110 Nausea: Secondary | ICD-10-CM | POA: Insufficient documentation

## 2014-12-29 DIAGNOSIS — Z862 Personal history of diseases of the blood and blood-forming organs and certain disorders involving the immune mechanism: Secondary | ICD-10-CM | POA: Diagnosis not present

## 2014-12-29 DIAGNOSIS — R42 Dizziness and giddiness: Secondary | ICD-10-CM | POA: Diagnosis not present

## 2014-12-29 DIAGNOSIS — R059 Cough, unspecified: Secondary | ICD-10-CM

## 2014-12-29 LAB — CBC WITH DIFFERENTIAL/PLATELET
BASOS ABS: 0 10*3/uL (ref 0.0–0.1)
Basophils Relative: 0 % (ref 0–1)
EOS ABS: 0.2 10*3/uL (ref 0.0–0.7)
Eosinophils Relative: 3 % (ref 0–5)
HCT: 41.6 % (ref 39.0–52.0)
HEMOGLOBIN: 14.3 g/dL (ref 13.0–17.0)
Lymphocytes Relative: 40 % (ref 12–46)
Lymphs Abs: 2.3 10*3/uL (ref 0.7–4.0)
MCH: 29.2 pg (ref 26.0–34.0)
MCHC: 34.4 g/dL (ref 30.0–36.0)
MCV: 85.1 fL (ref 78.0–100.0)
MONOS PCT: 14 % — AB (ref 3–12)
Monocytes Absolute: 0.8 10*3/uL (ref 0.1–1.0)
NEUTROS ABS: 2.4 10*3/uL (ref 1.7–7.7)
NEUTROS PCT: 43 % (ref 43–77)
PLATELETS: 203 10*3/uL (ref 150–400)
RBC: 4.89 MIL/uL (ref 4.22–5.81)
RDW: 13.6 % (ref 11.5–15.5)
WBC: 5.6 10*3/uL (ref 4.0–10.5)

## 2014-12-29 LAB — BASIC METABOLIC PANEL
Anion gap: 6 (ref 5–15)
BUN: 12 mg/dL (ref 6–20)
CHLORIDE: 107 mmol/L (ref 101–111)
CO2: 25 mmol/L (ref 22–32)
CREATININE: 0.84 mg/dL (ref 0.61–1.24)
Calcium: 8.7 mg/dL — ABNORMAL LOW (ref 8.9–10.3)
GFR calc Af Amer: >60 mL/min (ref >60)
GFR calc non Af Amer: >60 mL/min (ref >60)
GLUCOSE: 95 mg/dL (ref 65–99)
POTASSIUM: 3.4 mmol/L — AB (ref 3.5–5.1)
Sodium: 138 mmol/L (ref 135–145)

## 2014-12-29 LAB — I-STAT TROPONIN, ED: TROPONIN I, POC: 0 ng/mL (ref 0.00–0.08)

## 2014-12-29 MED ORDER — ALBUTEROL SULFATE (2.5 MG/3ML) 0.083% IN NEBU
5.0000 mg | INHALATION_SOLUTION | Freq: Once | RESPIRATORY_TRACT | Status: AC
  Administered 2014-12-29: 5 mg via RESPIRATORY_TRACT
  Filled 2014-12-29: qty 6

## 2014-12-29 MED ORDER — ONDANSETRON HCL 8 MG PO TABS: 8.0000 mg | ORAL_TABLET | Freq: Three times a day (TID) | ORAL | Status: DC | PRN

## 2014-12-29 MED ORDER — DIPHENHYDRAMINE HCL 50 MG/ML IJ SOLN
25.0000 mg | Freq: Once | INTRAMUSCULAR | Status: AC
  Administered 2014-12-29: 25 mg via INTRAVENOUS
  Filled 2014-12-29: qty 1

## 2014-12-29 MED ORDER — METOCLOPRAMIDE HCL 5 MG/ML IJ SOLN
10.0000 mg | Freq: Once | INTRAMUSCULAR | Status: AC
  Administered 2014-12-29: 10 mg via INTRAVENOUS
  Filled 2014-12-29: qty 2

## 2014-12-29 MED ORDER — KETOROLAC TROMETHAMINE 30 MG/ML IJ SOLN
30.0000 mg | Freq: Once | INTRAMUSCULAR | Status: AC
  Administered 2014-12-29: 30 mg via INTRAVENOUS
  Filled 2014-12-29: qty 1

## 2014-12-29 MED ORDER — SODIUM CHLORIDE 0.9 % IV BOLUS (SEPSIS)
1000.0000 mL | Freq: Once | INTRAVENOUS | Status: AC
  Administered 2014-12-29: 1000 mL via INTRAVENOUS

## 2014-12-29 MED ORDER — IPRATROPIUM BROMIDE 0.02 % IN SOLN
0.5000 mg | Freq: Once | RESPIRATORY_TRACT | Status: AC
  Administered 2014-12-29: 0.5 mg via RESPIRATORY_TRACT
  Filled 2014-12-29: qty 2.5

## 2014-12-29 NOTE — ED Provider Notes (Signed)
CSN: [id number]     Arrival date & time 12/29/14  0906 History   First MD Initiated Contact with Patient 12/29/14 0919     Chief Complaint  Patient presents with  . Headache     (Consider location/radiation/quality/duration/timing/severity/associated sxs/prior Treatment) HPI Comments: [name] is a 69 y.o. male with a PMHx of sarcoidosis, upper GI bleed, and hepC reactive, who presents to the ED with complaints of 3 days of headache, lightheadedness with standing, nausea, productive cough with white sputum, and intermittent wheezing. He reports that the headache is 5/10 pressure-like generalized, nonradiating, with no known aggravating or alleviating factors given the has not tried anything prior to arrival. He states that this is similar to his prior headaches. He reports that he is tapering off of prednisone and is wondering whether this taper may be the cause of his symptoms. Denies any fevers, chills, worsening chest pain or shortness of breath from baseline, hemoptysis, leg swelling, recent travel/surgery/immobilization, history of DVT/PE, neck pain or stiffness, abdominal pain, vomiting, diarrhea, constipation, melanotic, hematochezia, dysuria, hematuria, numbness, tingling, weakness, vision changes, or recent head injury. Denies any sick contacts. Follows up with [facility]  Patient is a 69 y.o. male presenting with headaches. The history is provided by the patient. No language interpreter was used.  Headache Pain location:  Generalized Quality: pressure. Radiates to:  Does not radiate Severity currently:  6/10 Severity at highest:  6/10 Onset quality:  Gradual Duration:  3 days Timing:  Constant Progression:  Unchanged Chronicity:  Chronic Similar to prior headaches: yes   Relieved by:  None tried Worsened by:  Nothing Ineffective treatments:  None tried Associated symptoms: cough and nausea   Associated symptoms: no abdominal pain, no blurred vision, no diarrhea,  no fever, no loss of balance, no myalgias, no near-syncope, no neck pain, no neck stiffness, no numbness, no paresthesias, no syncope, no tingling, no visual change, no vomiting and no weakness     Past Medical History  Diagnosis Date  . Sarcoidosis   . Hx of nonmelanoma skin cancer   . Upper GI bleeding     2013 upper GI bleed   . Hepatitis C reactive    Past Surgical History  Procedure Laterality Date  . Cancer on neck  2010    back of neck  . Hernia repair  1982  . Video bronchoscopy Bilateral 09/15/2013    Procedure: VIDEO BRONCHOSCOPY WITH FLUORO;  Surgeon: Kathee Delton, MD;  Location: [facility];  Service: Cardiopulmonary;  Laterality: Bilateral;  . Video bronchoscopy Bilateral 12/16/2013    Procedure: VIDEO BRONCHOSCOPY WITHOUT FLUORO;  Surgeon: Kathee Delton, MD;  Location: [facility];  Service: Cardiopulmonary;  Laterality: Bilateral;   Family History  Problem Relation Age of Onset  . Hypertension Mother   . Heart failure Father   . Cancer - Prostate Brother    History  Substance Use Topics  . Smoking status: Never Smoker   . Smokeless tobacco: Never Used  . Alcohol Use: No    Review of Systems  Constitutional: Negative for fever and chills.  Eyes: Negative for blurred vision and visual disturbance.  Respiratory: Positive for cough and wheezing. Negative for shortness of breath (no more than baseline).   Cardiovascular: Negative for chest pain, leg swelling, syncope and near-syncope.  Gastrointestinal: Positive for nausea. Negative for vomiting, abdominal pain, diarrhea, constipation and blood in stool.  Genitourinary: Negative for dysuria and hematuria.  Musculoskeletal: Negative for myalgias, arthralgias, neck pain and neck stiffness.  Skin: Negative for color change.  Allergic/Immunologic: Negative for immunocompromised state.  Neurological: Positive for light-headedness (with standing) and headaches. Negative for syncope, weakness, numbness, paresthesias  and loss of balance.  Psychiatric/Behavioral: Negative for confusion.   10 Systems reviewed and are negative for acute change except as noted in the HPI.    Allergies  Penicillins  Home Medications   Prior to Admission medications   Medication Sig Start Date End Date Taking? Authorizing Provider  COMBIGAN 0.2-0.5 % ophthalmic solution Place 1 drop into both eyes 2 (two) times daily. As directed 06/19/14   Historical Provider, MD  Fluticasone Furoate-Vilanterol (BREO ELLIPTA) 100-25 MCG/INH AEPB Inhale 1 puff into the lungs daily. 10/13/14   Kathee Delton, MD  latanoprost (XALATAN) 0.005 % ophthalmic solution Place 1 drop into both eyes at bedtime. As directed 06/19/14   Historical Provider, MD  predniSONE (DELTASONE) 10 MG tablet Take 1 tablet (10 mg total) by mouth daily. 08/19/14   Kathee Delton, MD  predniSONE (DELTASONE) 5 MG tablet 7.5mg  a day for 2 weeks, then 5mg  a day for 2 weeks, then 2.5mg  a day for 2 weeks, then stop. 10/13/14   Kathee Delton, MD   BP 121/92 mmHg  Pulse 81  Temp(Src) 98.1 F (36.7 C) (Oral)  Resp 16  SpO2 98% Physical Exam  Constitutional: He is oriented to person, place, and time. Vital signs are normal. He appears well-developed and well-nourished.  Non-toxic appearance. No distress.  Afebrile, nontoxic, NAD  HENT:  Head: Normocephalic and atraumatic.  Mouth/Throat: Oropharynx is clear and moist and mucous membranes are normal.  Eyes: Conjunctivae and EOM are normal. Pupils are equal, round, and reactive to light. Right eye exhibits no discharge. Left eye exhibits no discharge.  PERRL, EOMI, no nystagmus, no visual field deficits   Neck: Normal range of motion. Neck supple. No spinous process tenderness and no muscular tenderness present. No rigidity. Normal range of motion present.  No meningismus  Cardiovascular: Normal rate, regular rhythm, normal heart sounds and intact distal pulses.  Exam reveals no gallop and no friction rub.   No murmur  heard. RRR, nl s1/s2, no m/r/g, distal pulses intact, no pedal edema   Pulmonary/Chest: Effort normal. No respiratory distress. He has no decreased breath sounds. He has wheezes. He has no rhonchi. He has no rales.  Mild wheezing throughout, no rhonchi or rales, no hypoxia or increased WOB, speaking in full sentences, SpO2 98% on RA  Abdominal: Soft. Normal appearance and bowel sounds are normal. He exhibits no distension. There is no tenderness. There is no rigidity, no rebound, no guarding, no CVA tenderness, no tenderness at McBurney's point and negative Murphy's sign.  Soft, NTND, +BS throughout, no r/g/r, neg murphy's, neg mcburney's, no CVA TTP   Musculoskeletal: Normal range of motion.  MAE x4 Strength and sensation grossly intact Distal pulses intact No pedal edema, neg homan's bilaterally   Neurological: He is alert and oriented to person, place, and time. He has normal strength. No cranial nerve deficit or sensory deficit. He displays a negative Romberg sign. Coordination and gait normal. GCS eye subscore is 4. GCS verbal subscore is 5. GCS motor subscore is 6.  CN 2-12 grossly intact A&O x4 GCS 15 Sensation and strength intact Coordination with finger-to-nose WNL Neg romberg, neg pronator drift   Skin: Skin is warm, dry and intact. No rash noted.  Psychiatric: He has a normal mood and affect.  Nursing note and vitals reviewed.   ED Course  Procedures (including critical care time) 11:39 Orthostatic Vital Signs (after 1L fluids) KB  Orthostatic Lying  - BP- Lying: 134/87 mmHg ; Pulse- Lying: 63  Orthostatic Sitting - BP- Sitting: 145/89 mmHg ; Pulse- Sitting: 76  Orthostatic Standing at 0 minutes - BP- Standing at 0 minutes: 139/92 mmHg ; Pulse- Standing at 0 minutes: 77      Labs Review Labs Reviewed  CBC WITH DIFFERENTIAL/PLATELET - Abnormal; Notable for the following:    Monocytes Relative 14 (*)    All other components within normal limits  BASIC METABOLIC PANEL -  Abnormal; Notable for the following:    Potassium 3.4 (*)    Calcium 8.7 (*)    All other components within normal limits  Randolm Idol, ED    Imaging Review Dg Chest 2 View  12/29/2014   CLINICAL DATA:  Three-day history of cough.  History of sarcoidosis  EXAM: CHEST  2 VIEW  COMPARISON:  July 09, 2014  FINDINGS: Diffuse interstitial prominence remains, stable from prior study. There is no new opacity. There is mild right hilar adenopathy, stable. No new adenopathy apparent. Heart size and pulmonary vascularity are normal. No bone lesions.  IMPRESSION: Changes consistent with sarcoidosis, chronic and stable. No new opacity. No adenopathy apparent.   Electronically Signed   By: Lowella Grip III M.D.   On: 12/29/2014 10:12     EKG Interpretation None     ED ECG REPORT   Date: 12/29/2014  Rate: 64  Rhythm: normal sinus rhythm  QRS Axis: normal  Intervals: normal  ST/T Wave abnormalities: normal  Conduction Disutrbances:none  Narrative Interpretation:   Old EKG Reviewed: none available  I have personally reviewed the EKG tracing and agree with the computerized printout as noted.   MDM   Final diagnoses:  Cough  Orthostatic lightheadedness  Chronic nonintractable headache, unspecified headache type  Nausea    69 y.o. male here with HA, lightheadedness with standing, productive cough x3 days, and nausea. Feels similar to prior headaches, no sudden onset or worsened severity. Nonfocal neuro exam. Lung exam with mild wheezing, will give nebulizer but will avoid prednisone today because pt was instructed to taper off by his pulmonlogist. Denies SOB or CP that's different than his normal chest tightness and DOE, no hypoxia or tachycardia, no resp distress or increased WOB. Will give migraine cocktail, but doubt need for head CT. Will get CXR and labs. Will obtain orthostatic VS. Will reassess shortly.   11:46 AM Trop neg, EKG unremarkable, CBC and BMP unremarkable. CXR  without acute changes. Pt feels improved, orthostatic VS WNL after 1L bolus (none performed prior to bolus). Pt has pulm appt at 4pm. Will d/c home with instructions to use his inhalers at home, and f/up with pulm today. Discussed tylenol/motrin for headaches. Will give zofran for home. I explained the diagnosis and have given explicit precautions to return to the ER including for any other new or worsening symptoms. The patient understands and accepts the medical plan as it's been dictated and I have answered their questions. Discharge instructions concerning home care and prescriptions have been given. The patient is STABLE and is discharged to home in good condition.  BP 135/88 mmHg  Pulse 63  Temp(Src) 98.2 F (36.8 C) (Oral)  Resp 20  SpO2 97%  Meds ordered this encounter  Medications  . metoCLOPramide (REGLAN) injection 10 mg    Sig:    And  . diphenhydrAMINE (BENADRYL) injection 25 mg    Sig:  And  . sodium chloride 0.9 % bolus 1,000 mL    Sig:    And  . ketorolac (TORADOL) 30 MG/ML injection 30 mg    Sig:   . albuterol (PROVENTIL) (2.5 MG/3ML) 0.083% nebulizer solution 5 mg    Sig:   . ipratropium (ATROVENT) nebulizer solution 0.5 mg    Sig:   . ondansetron (ZOFRAN) 8 MG tablet    Sig: Take 1 tablet (8 mg total) by mouth every 8 (eight) hours as needed for nausea or vomiting.    Dispense:  10 tablet    Refill:  0    Order Specific Question:  Supervising Provider    Answer:  Jenny Reichmann Camprubi-Soms, PA-C 12/29/14 [place], MD 12/29/14 1413

## 2014-12-29 NOTE — Discharge Instructions (Signed)
Continue to stay well-hydrated using zofran as needed for nausea. Continue to alternate between Tylenol and Ibuprofen for pain or fever. Use inhaler as directed by your pulmonologist.  Followup with your pulmonologist today for ongoing management and recheck. Return to emergency department for emergent changing or worsening of symptoms.   General Headache Without Cause A headache is pain or discomfort felt around the head or neck area. The specific cause of a headache may not be found. There are many causes and types of headaches. A few common ones are:  Tension headaches.  Migraine headaches.  Cluster headaches.  Chronic daily headaches. HOME CARE INSTRUCTIONS   Keep all follow-up appointments with your caregiver or any specialist referral.  Only take over-the-counter or prescription medicines for pain or discomfort as directed by your caregiver.  Lie down in a dark, quiet room when you have a headache.  Keep a headache journal to find out what may trigger your migraine headaches. For example, write down:  What you eat and drink.  How much sleep you get.  Any change to your diet or medicines.  Try massage or other relaxation techniques.  Put ice packs or heat on the head and neck. Use these 3 to 4 times per day for 15 to 20 minutes each time, or as needed.  Limit stress.  Sit up straight, and do not tense your muscles.  Quit smoking if you smoke.  Limit alcohol use.  Decrease the amount of caffeine you drink, or stop drinking caffeine.  Eat and sleep on a regular schedule.  Get 7 to 9 hours of sleep, or as recommended by your caregiver.  Keep lights dim if bright lights bother you and make your headaches worse. SEEK MEDICAL CARE IF:   You have problems with the medicines you were prescribed.  Your medicines are not working.  You have a change from the usual headache.  You have nausea or vomiting. SEEK IMMEDIATE MEDICAL CARE IF:   Your headache becomes  severe.  You have a fever.  You have a stiff neck.  You have loss of vision.  You have muscular weakness or loss of muscle control.  You start losing your balance or have trouble walking.  You feel faint or pass out.  You have severe symptoms that are different from your first symptoms. MAKE SURE YOU:   Understand these instructions.  Will watch your condition.  Will get help right away if you are not doing well or get worse. Document Released: 07/02/2005 Document Revised: 09/24/2011 Document Reviewed: 07/18/2011 [facility] Patient Information 2015 [place], [place]. This information is not intended to replace advice given to you by your health care provider. Make sure you discuss any questions you have with your health care provider.  Cough, Adult  A cough is a reflex that helps clear your throat and airways. It can help heal the body or may be a reaction to an irritated airway. A cough may only last 2 or 3 weeks (acute) or may last more than 8 weeks (chronic).  CAUSES Acute cough:  Viral or bacterial infections. Chronic cough:  Infections.  Allergies.  Asthma.  Post-nasal drip.  Smoking.  Heartburn or acid reflux.  Some medicines.  Chronic lung problems (COPD).  Cancer. SYMPTOMS   Cough.  Fever.  Chest pain.  Increased breathing rate.  High-pitched whistling sound when breathing (wheezing).  Colored mucus that you cough up (sputum). TREATMENT   A bacterial cough may be treated with antibiotic medicine.  A viral  cough must run its course and will not respond to antibiotics.  Your caregiver may recommend other treatments if you have a chronic cough. HOME CARE INSTRUCTIONS   Only take over-the-counter or prescription medicines for pain, discomfort, or fever as directed by your caregiver. Use cough suppressants only as directed by your caregiver.  Use a cold steam vaporizer or humidifier in your bedroom or home to help loosen secretions.  Sleep in  a semi-upright position if your cough is worse at night.  Rest as needed.  Stop smoking if you smoke. SEEK IMMEDIATE MEDICAL CARE IF:   You have pus in your sputum.  Your cough starts to worsen.  You cannot control your cough with suppressants and are losing sleep.  You begin coughing up blood.  You have difficulty breathing.  You develop pain which is getting worse or is uncontrolled with medicine.  You have a fever. MAKE SURE YOU:   Understand these instructions.  Will watch your condition.  Will get help right away if you are not doing well or get worse. Document Released: 12/29/2010 Document Revised: 09/24/2011 Document Reviewed: 12/29/2010 [facility] Patient Information 2015 [place], [place]. This information is not intended to replace advice given to you by your health care provider. Make sure you discuss any questions you have with your health care provider.  Nausea, Adult Nausea is the feeling that you have an upset stomach or have to vomit. Nausea by itself is not likely a serious concern, but it may be an early sign of more serious medical problems. As nausea gets worse, it can lead to vomiting. If vomiting develops, there is the risk of dehydration.  CAUSES   Viral infections.  Food poisoning.  Medicines.  Pregnancy.  Motion sickness.  Migraine headaches.  Emotional distress.  Severe pain from any source.  Alcohol intoxication. HOME CARE INSTRUCTIONS  Get plenty of rest.  Ask your caregiver about specific rehydration instructions.  Eat small amounts of food and sip liquids more often.  Take all medicines as told by your caregiver. SEEK MEDICAL CARE IF:  You have not improved after 2 days, or you get worse.  You have a headache. SEEK IMMEDIATE MEDICAL CARE IF:   You have a fever.  You faint.  You keep vomiting or have blood in your vomit.  You are extremely weak or dehydrated.  You have dark or bloody stools.  You have severe chest  or abdominal pain. MAKE SURE YOU:  Understand these instructions.  Will watch your condition.  Will get help right away if you are not doing well or get worse. Document Released: 08/09/2004 Document Revised: 03/26/2012 Document Reviewed: 03/14/2011 [facility] Patient Information 2015 [place], [place]. This information is not intended to replace advice given to you by your health care provider. Make sure you discuss any questions you have with your health care provider.  Orthostatic Hypotension Orthostatic hypotension is a sudden drop in blood pressure. It happens when you quickly stand up from a seated or lying position. You may feel dizzy or light-headed. This can last for just a few seconds or for up to a few minutes. It is usually not a serious problem. However, if this happens frequently or gets worse, it can be a sign of something more serious. CAUSES  Different things can cause orthostatic hypotension, including:   Loss of body fluids (dehydration).  Medicines that lower blood pressure.  Sudden changes in posture, such as standing up quickly after you have been sitting or lying down.  Taking too much of your medicine. SIGNS AND SYMPTOMS   Light-headedness or dizziness.   Fainting or near-fainting.   A fast heart rate.   Weakness.   Feeling tired (fatigue).  DIAGNOSIS  Your health care provider may do several things to help diagnose your condition and identify the cause. These may include:   Taking a medical history and doing a physical exam.  Checking your blood pressure. Your health care provider will check your blood pressure when you are:  Lying down.  Sitting.  Standing.  Using tilt table testing. In this test, you lie down on a table that moves from a lying position to a standing position. You will be strapped onto the table. This test monitors your blood pressure and heart rate when you are in different positions. TREATMENT  Treatment will vary depending  on the cause. Possible treatments include:   Changing the dosage of your medicines.  Wearing compression stockings on your lower legs.  Standing up slowly after sitting or lying down.  Eating more salt.  Eating frequent, small meals.  In some cases, getting IV fluids.  Taking medicine to enhance fluid retention. HOME CARE INSTRUCTIONS  Only take over-the-counter or prescription medicines as directed by your health care provider.  Follow your health care provider's instructions for changing the dosage of your current medicines.  Do not stop or adjust your medicine on your own.  Stand up slowly after sitting or lying down. This allows your body to adjust to the different position.  Wear compression stockings as directed.  Eat extra salt as directed.  Do not add extra salt to your diet unless directed to by your health care provider.  Eat frequent, small meals.  Avoid standing suddenly after eating.  Avoid hot showers or excessive heat as directed by your health care provider.  Keep all follow-up appointments. SEEK MEDICAL CARE IF:  You continue to feel dizzy or light-headed after standing.  You feel groggy or confused.  You feel cold, clammy, or sick to your stomach (nauseous).  You have blurred vision.  You feel short of breath. SEEK IMMEDIATE MEDICAL CARE IF:   You faint after standing.  You have chest pain.  You have difficulty breathing.   You lose feeling or movement in your arms or legs.   You have slurred speech or difficulty talking, or you are unable to talk.  MAKE SURE YOU:   Understand these instructions.  Will watch your condition.  Will get help right away if you are not doing well or get worse. Document Released: 06/22/2002 Document Revised: 07/07/2013 Document Reviewed: 04/24/2013 [facility] Patient Information 2015 [place], [place]. This information is not intended to replace advice given to you by your health care provider. Make  sure you discuss any questions you have with your health care provider.

## 2014-12-29 NOTE — ED Notes (Addendum)
Pt reports hx of sarcoidosis, currently being tapered off steroid.  Reports headache and dizziness x3 days. Nausea present, denies abd pain. Productive cough x3 days.

## 2014-12-29 NOTE — ED Notes (Signed)
Attending at bedside.

## 2014-12-29 NOTE — ED Notes (Signed)
Patient transported to X-ray 

## 2015-01-10 ENCOUNTER — Ambulatory Visit: Payer: Commercial Managed Care - HMO | Admitting: Internal Medicine

## 2015-01-13 ENCOUNTER — Ambulatory Visit: Payer: Commercial Managed Care - HMO | Admitting: Internal Medicine

## 2015-01-24 ENCOUNTER — Ambulatory Visit: Payer: Commercial Managed Care - HMO | Admitting: Internal Medicine

## 2015-02-01 DIAGNOSIS — H43813 Vitreous degeneration, bilateral: Secondary | ICD-10-CM | POA: Diagnosis not present

## 2015-02-01 DIAGNOSIS — H4011X3 Primary open-angle glaucoma, severe stage: Secondary | ICD-10-CM | POA: Diagnosis not present

## 2015-02-01 DIAGNOSIS — H2513 Age-related nuclear cataract, bilateral: Secondary | ICD-10-CM | POA: Diagnosis not present

## 2015-03-08 ENCOUNTER — Emergency Department (HOSPITAL_COMMUNITY): Payer: Commercial Managed Care - HMO

## 2015-03-08 ENCOUNTER — Encounter (HOSPITAL_COMMUNITY): Payer: Self-pay

## 2015-03-08 ENCOUNTER — Emergency Department (HOSPITAL_COMMUNITY)
Admission: EM | Admit: 2015-03-08 | Discharge: 2015-03-08 | Disposition: A | Discharge status: Home / Self Care | Payer: Commercial Managed Care - HMO | Attending: Emergency Medicine | Admitting: Emergency Medicine

## 2015-03-08 DIAGNOSIS — R05 Cough: Secondary | ICD-10-CM | POA: Insufficient documentation

## 2015-03-08 DIAGNOSIS — R0789 Other chest pain: Secondary | ICD-10-CM | POA: Diagnosis not present

## 2015-03-08 DIAGNOSIS — Z880 Allergy status to penicillin: Secondary | ICD-10-CM | POA: Insufficient documentation

## 2015-03-08 DIAGNOSIS — Z8719 Personal history of other diseases of the digestive system: Secondary | ICD-10-CM | POA: Diagnosis not present

## 2015-03-08 DIAGNOSIS — Z85828 Personal history of other malignant neoplasm of skin: Secondary | ICD-10-CM | POA: Diagnosis not present

## 2015-03-08 DIAGNOSIS — D869 Sarcoidosis, unspecified: Secondary | ICD-10-CM | POA: Diagnosis not present

## 2015-03-08 DIAGNOSIS — J181 Lobar pneumonia, unspecified organism: Secondary | ICD-10-CM | POA: Diagnosis not present

## 2015-03-08 DIAGNOSIS — Z8619 Personal history of other infectious and parasitic diseases: Secondary | ICD-10-CM | POA: Insufficient documentation

## 2015-03-08 DIAGNOSIS — M545 Low back pain: Secondary | ICD-10-CM | POA: Diagnosis not present

## 2015-03-08 DIAGNOSIS — Z7951 Long term (current) use of inhaled steroids: Secondary | ICD-10-CM | POA: Diagnosis not present

## 2015-03-08 DIAGNOSIS — Z79899 Other long term (current) drug therapy: Secondary | ICD-10-CM | POA: Diagnosis not present

## 2015-03-08 DIAGNOSIS — R079 Chest pain, unspecified: Secondary | ICD-10-CM | POA: Diagnosis not present

## 2015-03-08 LAB — CBC
HEMATOCRIT: 39.5 % (ref 39.0–52.0)
Hemoglobin: 13.9 g/dL (ref 13.0–17.0)
MCH: 29.9 pg (ref 26.0–34.0)
MCHC: 35.2 g/dL (ref 30.0–36.0)
MCV: 84.9 fL (ref 78.0–100.0)
Platelets: 209 10*3/uL (ref 150–400)
RBC: 4.65 MIL/uL (ref 4.22–5.81)
RDW: 14 % (ref 11.5–15.5)
WBC: 5.5 10*3/uL (ref 4.0–10.5)

## 2015-03-08 LAB — I-STAT TROPONIN, ED
TROPONIN I, POC: 0 ng/mL (ref 0.00–0.08)
Troponin i, poc: 0 ng/mL (ref 0.00–0.08)

## 2015-03-08 LAB — BASIC METABOLIC PANEL
ANION GAP: 6 (ref 5–15)
BUN: 15 mg/dL (ref 6–20)
CHLORIDE: 104 mmol/L (ref 101–111)
CO2: 25 mmol/L (ref 22–32)
Calcium: 8.8 mg/dL — ABNORMAL LOW (ref 8.9–10.3)
Creatinine, Ser: 0.81 mg/dL (ref 0.61–1.24)
GFR calc Af Amer: >60 mL/min (ref >60)
GFR calc non Af Amer: >60 mL/min (ref >60)
Glucose, Bld: 95 mg/dL (ref 65–99)
POTASSIUM: 3.7 mmol/L (ref 3.5–5.1)
SODIUM: 135 mmol/L (ref 135–145)

## 2015-03-08 LAB — D-DIMER, QUANTITATIVE (NOT AT ARMC): D-Dimer, Quant: 1.16 ug/mL-FEU — ABNORMAL HIGH (ref 0.00–0.48)

## 2015-03-08 MED ORDER — MORPHINE SULFATE (PF) 4 MG/ML IV SOLN
4.0000 mg | Freq: Once | INTRAVENOUS | Status: AC
  Administered 2015-03-08: 4 mg via INTRAVENOUS
  Filled 2015-03-08: qty 1

## 2015-03-08 MED ORDER — HYDROCODONE-ACETAMINOPHEN 5-325 MG PO TABS
2.0000 | ORAL_TABLET | Freq: Once | ORAL | Status: AC
  Administered 2015-03-08: 2 via ORAL
  Filled 2015-03-08: qty 2

## 2015-03-08 MED ORDER — HYDROCODONE-ACETAMINOPHEN 5-325 MG PO TABS: 2.0000 | ORAL_TABLET | ORAL | Status: DC | PRN

## 2015-03-08 MED ORDER — IOHEXOL 350 MG/ML SOLN
100.0000 mL | Freq: Once | INTRAVENOUS | Status: AC | PRN
  Administered 2015-03-08: 100 mL via INTRAVENOUS

## 2015-03-08 MED ORDER — ACYCLOVIR 400 MG PO TABS: 800.0000 mg | ORAL_TABLET | Freq: Four times a day (QID) | ORAL | Status: AC

## 2015-03-08 NOTE — Discharge Instructions (Signed)
You may have early shingles. Please take medications as prescribed. Return for worsening symptoms, including worsening pain, passing out, difficulty breathing, or any other symptoms concerning to you.   Chest Wall Pain Chest wall pain is pain in or around the bones and muscles of your chest. It may take up to 6 weeks to get better. It may take longer if you must stay physically active in your work and activities.  CAUSES  Chest wall pain may happen on its own. However, it may be caused by:  A viral illness like the flu.  Injury.  Coughing.  Exercise.  Arthritis.  Fibromyalgia.  Shingles. HOME CARE INSTRUCTIONS   Avoid overtiring physical activity. Try not to strain or perform activities that cause pain. This includes any activities using your chest or your abdominal and side muscles, especially if heavy weights are used.  Put ice on the sore area.  Put ice in a plastic bag.  Place a towel between your skin and the bag.  Leave the ice on for 15-20 minutes per hour while awake for the first 2 days.  Only take over-the-counter or prescription medicines for pain, discomfort, or fever as directed by your caregiver. SEEK IMMEDIATE MEDICAL CARE IF:   Your pain increases, or you are very uncomfortable.  You have a fever.  Your chest pain becomes worse.  You have new, unexplained symptoms.  You have nausea or vomiting.  You feel sweaty or lightheaded.  You have a cough with phlegm (sputum), or you cough up blood. MAKE SURE YOU:   Understand these instructions.  Will watch your condition.  Will get help right away if you are not doing well or get worse. Document Released: 07/02/2005 Document Revised: 09/24/2011 Document Reviewed: 02/26/2011 [facility] Patient Information 2015 [place], [place]. This information is not intended to replace advice given to you by your health care provider. Make sure you discuss any questions you have with your health care provider.

## 2015-03-08 NOTE — ED Notes (Signed)
Nurse starting IV 

## 2015-03-08 NOTE — ED Provider Notes (Signed)
CSN: [id number]     Arrival date & time 03/08/15  0558 History   First MD Initiated Contact with Patient 03/08/15 [phone]     Chief Complaint  Patient presents with  . Back Pain  . Chest Pain  . Cough     (Consider location/radiation/quality/duration/timing/severity/associated sxs/prior Treatment) HPI 69 year old male who presents with chest pain. History of sarcoidosis. Reports getting up from bed at 2:30AM this morning experiencing severe pain in his right upper upper back, radiating into the anterior chest wall under through the axilla. Pain is sharp and stabbing, and worse with movement of the right arm and deep breaths. Associated with difficulty catching his breath. Denies fever or chills. Has had cough associated with sarcoidosis and was on steroids for 9 months. Noted a small blister over area of pain a few days ago.   Past Medical History  Diagnosis Date  . Sarcoidosis   . Hx of nonmelanoma skin cancer   . Upper GI bleeding     2013 upper GI bleed   . Hepatitis C reactive    Past Surgical History  Procedure Laterality Date  . Cancer on neck  2010    back of neck  . Hernia repair  1982  . Video bronchoscopy Bilateral 09/15/2013    Procedure: VIDEO BRONCHOSCOPY WITH FLUORO;  Surgeon: Kathee Delton, MD;  Location: [facility];  Service: Cardiopulmonary;  Laterality: Bilateral;  . Video bronchoscopy Bilateral 12/16/2013    Procedure: VIDEO BRONCHOSCOPY WITHOUT FLUORO;  Surgeon: Kathee Delton, MD;  Location: [facility];  Service: Cardiopulmonary;  Laterality: Bilateral;   Family History  Problem Relation Age of Onset  . Hypertension Mother   . Heart failure Father   . Cancer - Prostate Brother    Social History  Substance Use Topics  . Smoking status: Never Smoker   . Smokeless tobacco: Never Used  . Alcohol Use: No    Review of Systems 10/14 systems reviewed and are negative other than those stated in the HPI    Allergies  Penicillins  Home Medications    Prior to Admission medications   Medication Sig Start Date End Date Taking? Authorizing Provider  acetaminophen (TYLENOL) 325 MG tablet Take 650 mg by mouth every 6 (six) hours as needed for headache.   Yes Historical Provider, MD  COMBIGAN 0.2-0.5 % ophthalmic solution Place 1 drop into both eyes 2 (two) times daily. As directed 06/19/14  Yes Historical Provider, MD  Fluticasone Furoate-Vilanterol (BREO ELLIPTA) 100-25 MCG/INH AEPB Inhale 1 puff into the lungs daily. Patient taking differently: Inhale 1 puff into the lungs daily as needed (shortness of breath).  10/13/14  Yes Kathee Delton, MD  latanoprost (XALATAN) 0.005 % ophthalmic solution Place 1 drop into both eyes at bedtime. As directed 06/19/14  Yes Historical Provider, MD  ondansetron (ZOFRAN) 8 MG tablet Take 1 tablet (8 mg total) by mouth every 8 (eight) hours as needed for nausea or vomiting. Patient not taking: Reported on 03/08/2015 12/29/14   Mercedes Camprubi-Soms, PA-C  predniSONE (DELTASONE) 10 MG tablet Take 1 tablet (10 mg total) by mouth daily. Patient not taking: Reported on 12/29/2014 08/19/14   Kathee Delton, MD  predniSONE (DELTASONE) 5 MG tablet 7.5mg  a day for 2 weeks, then 5mg  a day for 2 weeks, then 2.5mg  a day for 2 weeks, then stop. Patient not taking: Reported on 03/08/2015 10/13/14   Kathee Delton, MD   BP 141/88 mmHg  Pulse 67  Temp(Src) 98.1 F (  36.7 C) (Oral)  Resp 21  SpO2 91% Physical Exam Physical Exam  Nursing note and vitals reviewed. Constitutional: Well developed, well nourished, non-toxic, and in no acute distress Head: Normocephalic and atraumatic.  Mouth/Throat: Oropharynx is clear and moist.  Neck: Normal range of motion. Neck supple.  Cardiovascular: Normal rate and regular rhythm.   Pulmonary/Chest: Effort normal and breath sounds normal. There is exquisite tenderness of skin from the right scapula under axilla over to anterior chest wall. Small blister noted over the right upper back, but  no other appreciable rash.  Abdominal: Soft. There is no tenderness. There is no rebound and no guarding.  Musculoskeletal: Normal range of motion.  Neurological: Alert, no facial droop, fluent speech, moves all extremities symmetrically Skin: Skin is warm and dry.  Psychiatric: Cooperative  ED Course  Procedures (including critical care time) Labs Review Labs Reviewed  BASIC METABOLIC PANEL - Abnormal; Notable for the following:    Calcium 8.8 (*)    All other components within normal limits  D-DIMER, QUANTITATIVE (NOT AT [facility]) - Abnormal; Notable for the following:    D-Dimer, Quant 1.16 (*)    All other components within normal limits  CBC  I-STAT TROPOININ, ED  Randolm Idol, ED    Imaging Review Dg Chest 2 View  03/08/2015   CLINICAL DATA:  Chest pain.  Sarcoidosis  EXAM: CHEST  2 VIEW  COMPARISON:  12/29/2014.  08/29/2013.  09/01/2010 .  CT 08/29/2013.  FINDINGS: Stable mild bilateral hilar fullness consistent with adenopathy. Heart size stable. Diffuse unchanged chronic interstitial prominence noted consistent patient's known sarcoidosis. No acute infiltrate. No pleural effusion or pneumothorax. No acute bony abnormality.  IMPRESSION: Stable mild hilar adenopathy and chronic diffuse interstitial prominence consistent with known sarcoidosis. No acute cardiopulmonary disease noted .   Electronically Signed   By: Marcello Moores  Register   On: 03/08/2015 07:19   Ct Angio Chest Pe W/cm &/or Wo Cm  03/08/2015   CLINICAL DATA:  Sudden onset of severe sharp stabbing upper back pain radiating to the chest. Pain is worse with movement of the right arm and deep breathing. History of sarcoidosis.  EXAM: CT ANGIOGRAPHY CHEST WITH CONTRAST  TECHNIQUE: Multidetector CT imaging of the chest was performed using the standard protocol during bolus administration of intravenous contrast. Multiplanar CT image reconstructions and MIPs were obtained to evaluate the vascular anatomy.  CONTRAST:  13mL  OMNIPAQUE IOHEXOL 350 MG/ML SOLN  COMPARISON:  Chest radiograph dated 03/08/2015, CT of the chest 08/29/2013 and 09/03/2010  FINDINGS: There shotty mediastinal, hilar and retroperitoneal lymphadenopathy. Some of the mediastinal lymph nodes demonstrate intramural coarse calcifications. Findings are consistent with known history of sarcoidosis. The heart is normal in size. Increased opacification of the right heart and back flow of contrast into the inferior vena cava is noted, findings which may be associated with heart failure.  There is no evidence of pulmonary embolus.Great vessels are normal in caliber and distribution. There is no evidence of aortic dissection or aneurysmal dilation. Tortuosity of the descending aorta is noted.  There is widespread bilateral interstitial thickening, and presence of a numerous ground-glass and soft tissue nodules, likely secondary to sarcoidosis. There is also a more confluent consolidated area in the lateral subpleural right lower lobe, measuring 2.5 x 1.5 cm, not significantly changed from prior CT in February 2015, and February 2012, which suggests benign etiology.  The visualized portions of the thyroid gland are unremarkable in appearance.  The visualized portions of the liver and spleen  are unremarkable. The visualized portions of the pancreas, stomach, adrenal glands and kidneys are within normal limits.  No acute osseous abnormalities are seen. There is a sclerotic appearance of the anterior/lateral portion of the left fifth rib. In the absence of other sclerotic lesions of the axial skeleton, and history of malignancy, this likely represents a posttraumatic finding.  Review of the MIP images confirms the above findings.  IMPRESSION: Extensive bilateral pulmonary interstitial and reticulonodular consolidation, and associated partially calcified mediastinal and hilar lymphadenopathy, not significantly changed from the 2 prior studies, dating back to 2012 and likely due to  patient's known sarcoidosis.  Sclerotic appearance of the anterior/lateral portion of the left fifth rib. In the absence of other sclerotic lesions of the axial skeleton, and history of malignancy, this likely represents a posttraumatic finding. Attention on future follow up is recommended.   Electronically Signed   By: Fidela Salisbury M.D.   On: 03/08/2015 09:58   I have personally reviewed and evaluated these images and lab results as part of my medical decision-making.   EKG Interpretation   Date/Time:  Tuesday March 08 2015 11:07:17 EDT Ventricular Rate:  59 PR Interval:  174 QRS Duration: 96 QT Interval:  423 QTC Calculation: 419 R Axis:   85 Text Interpretation:  Sinus rhythm Probable left atrial enlargement  Borderline right axis deviation No significant change since last tracing  Confirmed by Deamonte Sayegh MD, Hinton Dyer ([id number]) on 03/08/2015 12:01:06 PM      MDM   Final diagnoses:  Chest wall pain   69 year old male who presents with right chest pain and right upper back pain. He is non-toxic and in no acute distress on presentation. VS normal. He has reproducible tenderness on exam with palpation over right scapular under the axilla and over the anterior chest wall. Small single open blister is noted over area as well. Remainder of exam unremarkable. EKG non-ischemic and without signs of heart strain. Serial troponin negative and no dynamic EKG changes. History and exam not consistent or c/f ACS and he is felt to be adequately ruled out. D-dimer also sent for r/o of PE, and positive. CT chest subsequently performed, and shows stable changes of sarcoidosis and no PE. Given exquisite pain associated with touching of skin, c/f early shingles, especially in setting of chronic steroid usage/immunosuppression. Started on acyclovir. Patient to follow-up with PCP. Strict return and follow-up instructions reviewed. He expressed understanding of all discharge instructions and felt comfortable with the  plan of care.    Forde Dandy, MD 03/08/15 [phone]

## 2015-03-08 NOTE — ED Notes (Signed)
MD at bedside. 

## 2015-03-08 NOTE — ED Notes (Signed)
Pt c/o mid back pain radiating into center chest starting around 0200 and dry cough x "a while."  Pain score 10/10, increasing w/ deep breathing. +Lighheadedness  Pt reports the pain is "sharp and it feels like something is sitting on my chest."  Hx of sarcoidosis.

## 2015-03-08 NOTE — ED Notes (Signed)
Patient transported to CT 

## 2015-03-09 DIAGNOSIS — R0789 Other chest pain: Secondary | ICD-10-CM | POA: Diagnosis not present

## 2015-03-09 DIAGNOSIS — L309 Dermatitis, unspecified: Secondary | ICD-10-CM | POA: Diagnosis not present

## 2015-03-09 DIAGNOSIS — M25511 Pain in right shoulder: Secondary | ICD-10-CM | POA: Diagnosis not present

## 2015-03-11 DIAGNOSIS — M25511 Pain in right shoulder: Secondary | ICD-10-CM | POA: Diagnosis not present

## 2015-03-11 DIAGNOSIS — M899 Disorder of bone, unspecified: Secondary | ICD-10-CM | POA: Diagnosis not present

## 2015-03-11 DIAGNOSIS — M25461 Effusion, right knee: Secondary | ICD-10-CM | POA: Diagnosis not present

## 2015-03-16 DIAGNOSIS — M1711 Unilateral primary osteoarthritis, right knee: Secondary | ICD-10-CM | POA: Diagnosis not present

## 2015-03-30 DIAGNOSIS — M25561 Pain in right knee: Secondary | ICD-10-CM | POA: Diagnosis not present

## 2015-03-30 DIAGNOSIS — M25511 Pain in right shoulder: Secondary | ICD-10-CM | POA: Diagnosis not present

## 2015-04-04 ENCOUNTER — Other Ambulatory Visit: Payer: Self-pay | Admitting: Orthopedic Surgery

## 2015-04-04 DIAGNOSIS — M25561 Pain in right knee: Secondary | ICD-10-CM

## 2015-04-05 ENCOUNTER — Other Ambulatory Visit: Payer: Self-pay | Admitting: Orthopedic Surgery

## 2015-04-05 ENCOUNTER — Ambulatory Visit
Admission: RE | Admit: 2015-04-05 | Discharge: 2015-04-05 | Disposition: A | Discharge status: Home / Self Care | Payer: Commercial Managed Care - HMO | Source: Ambulatory Visit | Attending: Orthopedic Surgery | Admitting: Orthopedic Surgery

## 2015-04-05 DIAGNOSIS — M25561 Pain in right knee: Secondary | ICD-10-CM

## 2015-04-05 DIAGNOSIS — T1590XA Foreign body on external eye, part unspecified, unspecified eye, initial encounter: Secondary | ICD-10-CM

## 2015-04-06 ENCOUNTER — Ambulatory Visit
Admission: RE | Admit: 2015-04-06 | Discharge: 2015-04-06 | Disposition: A | Discharge status: Home / Self Care | Payer: Commercial Managed Care - HMO | Source: Ambulatory Visit | Attending: Orthopedic Surgery | Admitting: Orthopedic Surgery

## 2015-04-06 DIAGNOSIS — Z01818 Encounter for other preprocedural examination: Secondary | ICD-10-CM | POA: Diagnosis not present

## 2015-04-06 DIAGNOSIS — T1590XA Foreign body on external eye, part unspecified, unspecified eye, initial encounter: Secondary | ICD-10-CM

## 2015-04-06 DIAGNOSIS — S838X1A Sprain of other specified parts of right knee, initial encounter: Secondary | ICD-10-CM | POA: Diagnosis not present

## 2015-04-07 DIAGNOSIS — M23331 Other meniscus derangements, other medial meniscus, right knee: Secondary | ICD-10-CM | POA: Diagnosis not present

## 2015-04-11 DIAGNOSIS — G8918 Other acute postprocedural pain: Secondary | ICD-10-CM | POA: Diagnosis not present

## 2015-04-11 DIAGNOSIS — M23261 Derangement of other lateral meniscus due to old tear or injury, right knee: Secondary | ICD-10-CM | POA: Diagnosis not present

## 2015-04-11 DIAGNOSIS — M23231 Derangement of other medial meniscus due to old tear or injury, right knee: Secondary | ICD-10-CM | POA: Diagnosis not present

## 2015-04-11 DIAGNOSIS — M94261 Chondromalacia, right knee: Secondary | ICD-10-CM | POA: Diagnosis not present

## 2015-04-11 DIAGNOSIS — M23331 Other meniscus derangements, other medial meniscus, right knee: Secondary | ICD-10-CM | POA: Diagnosis not present

## 2015-06-03 ENCOUNTER — Other Ambulatory Visit: Payer: Self-pay | Admitting: *Deleted

## 2015-06-03 ENCOUNTER — Encounter: Payer: Self-pay | Admitting: *Deleted

## 2015-06-03 DIAGNOSIS — D869 Sarcoidosis, unspecified: Secondary | ICD-10-CM

## 2015-06-03 NOTE — Patient Outreach (Signed)
Pt screened for [facility] care management services. Pt does not need nursing or pharmacy services but would like to discuss some resources with a Education officer, museum. He needs dental insurance or a dentist that will do work at low cost. Also he is looking for new life insurance. I told him our social workers may be able to give him some suggestions on both points.  I am handing the case off for social worker assignment.  Deloria Lair [facility] [place] [phone]

## 2015-06-15 DIAGNOSIS — H43813 Vitreous degeneration, bilateral: Secondary | ICD-10-CM | POA: Diagnosis not present

## 2015-06-15 DIAGNOSIS — H04123 Dry eye syndrome of bilateral lacrimal glands: Secondary | ICD-10-CM | POA: Diagnosis not present

## 2015-06-15 DIAGNOSIS — H401133 Primary open-angle glaucoma, bilateral, severe stage: Secondary | ICD-10-CM | POA: Diagnosis not present

## 2015-07-06 DIAGNOSIS — M1711 Unilateral primary osteoarthritis, right knee: Secondary | ICD-10-CM | POA: Diagnosis not present

## 2015-08-18 DIAGNOSIS — M1711 Unilateral primary osteoarthritis, right knee: Secondary | ICD-10-CM | POA: Diagnosis not present

## 2015-08-25 DIAGNOSIS — M1711 Unilateral primary osteoarthritis, right knee: Secondary | ICD-10-CM | POA: Diagnosis not present

## 2015-08-31 ENCOUNTER — Emergency Department (HOSPITAL_COMMUNITY)
Admission: EM | Admit: 2015-08-31 | Discharge: 2015-08-31 | Disposition: A | Discharge status: Home / Self Care | Payer: Commercial Managed Care - HMO | Attending: Emergency Medicine | Admitting: Emergency Medicine

## 2015-08-31 ENCOUNTER — Emergency Department (HOSPITAL_COMMUNITY): Payer: Commercial Managed Care - HMO

## 2015-08-31 ENCOUNTER — Encounter (HOSPITAL_COMMUNITY): Payer: Self-pay

## 2015-08-31 DIAGNOSIS — Z880 Allergy status to penicillin: Secondary | ICD-10-CM | POA: Diagnosis not present

## 2015-08-31 DIAGNOSIS — Z7951 Long term (current) use of inhaled steroids: Secondary | ICD-10-CM | POA: Insufficient documentation

## 2015-08-31 DIAGNOSIS — D849 Immunodeficiency, unspecified: Secondary | ICD-10-CM | POA: Insufficient documentation

## 2015-08-31 DIAGNOSIS — Z79899 Other long term (current) drug therapy: Secondary | ICD-10-CM | POA: Diagnosis not present

## 2015-08-31 DIAGNOSIS — R0600 Dyspnea, unspecified: Secondary | ICD-10-CM | POA: Diagnosis not present

## 2015-08-31 DIAGNOSIS — Z8619 Personal history of other infectious and parasitic diseases: Secondary | ICD-10-CM | POA: Diagnosis not present

## 2015-08-31 DIAGNOSIS — Z8719 Personal history of other diseases of the digestive system: Secondary | ICD-10-CM | POA: Insufficient documentation

## 2015-08-31 DIAGNOSIS — R042 Hemoptysis: Secondary | ICD-10-CM | POA: Insufficient documentation

## 2015-08-31 DIAGNOSIS — R0602 Shortness of breath: Secondary | ICD-10-CM | POA: Diagnosis not present

## 2015-08-31 DIAGNOSIS — Z85828 Personal history of other malignant neoplasm of skin: Secondary | ICD-10-CM | POA: Diagnosis not present

## 2015-08-31 DIAGNOSIS — R0981 Nasal congestion: Secondary | ICD-10-CM | POA: Diagnosis present

## 2015-08-31 LAB — CBC WITH DIFFERENTIAL/PLATELET
Basophils Absolute: 0 10*3/uL (ref 0.0–0.1)
Basophils Relative: 0 %
EOS PCT: 4 %
Eosinophils Absolute: 0.2 10*3/uL (ref 0.0–0.7)
HCT: 39.2 % (ref 39.0–52.0)
Hemoglobin: 12.6 g/dL — ABNORMAL LOW (ref 13.0–17.0)
LYMPHS ABS: 2.1 10*3/uL (ref 0.7–4.0)
LYMPHS PCT: 44 %
MCH: 27.9 pg (ref 26.0–34.0)
MCHC: 32.1 g/dL (ref 30.0–36.0)
MCV: 86.9 fL (ref 78.0–100.0)
MONO ABS: 0.4 10*3/uL (ref 0.1–1.0)
Monocytes Relative: 9 %
NEUTROS PCT: 43 %
Neutro Abs: 2 10*3/uL (ref 1.7–7.7)
PLATELETS: 203 10*3/uL (ref 150–400)
RBC: 4.51 MIL/uL (ref 4.22–5.81)
RDW: 15.3 % (ref 11.5–15.5)
WBC: 4.8 10*3/uL (ref 4.0–10.5)

## 2015-08-31 LAB — COMPREHENSIVE METABOLIC PANEL
ALT: 22 U/L (ref 17–63)
AST: 49 U/L — ABNORMAL HIGH (ref 15–41)
Albumin: 2.9 g/dL — ABNORMAL LOW (ref 3.5–5.0)
Alkaline Phosphatase: 48 U/L (ref 38–126)
Anion gap: 7 (ref 5–15)
BUN: 13 mg/dL (ref 6–20)
CHLORIDE: 105 mmol/L (ref 101–111)
CO2: 24 mmol/L (ref 22–32)
CREATININE: 0.75 mg/dL (ref 0.61–1.24)
Calcium: 8.6 mg/dL — ABNORMAL LOW (ref 8.9–10.3)
Glucose, Bld: 106 mg/dL — ABNORMAL HIGH (ref 65–99)
POTASSIUM: 3.5 mmol/L (ref 3.5–5.1)
Sodium: 136 mmol/L (ref 135–145)
TOTAL PROTEIN: 9.8 g/dL — AB (ref 6.5–8.1)
Total Bilirubin: 0.7 mg/dL (ref 0.3–1.2)

## 2015-08-31 MED ORDER — PREDNISONE 20 MG PO TABS: 60.0000 mg | ORAL_TABLET | Freq: Every day | ORAL | Status: AC

## 2015-08-31 MED ORDER — PREDNISONE 20 MG PO TABS
60.0000 mg | ORAL_TABLET | Freq: Once | ORAL | Status: AC
  Administered 2015-08-31: 60 mg via ORAL
  Filled 2015-08-31: qty 3

## 2015-08-31 MED ORDER — MOMETASONE FURO-FORMOTEROL FUM 100-5 MCG/ACT IN AERO
2.0000 | INHALATION_SPRAY | Freq: Two times a day (BID) | RESPIRATORY_TRACT | Status: DC
  Filled 2015-08-31: qty 8.8

## 2015-08-31 MED ORDER — MOMETASONE FURO-FORMOTEROL FUM 100-5 MCG/ACT IN AERO
2.0000 | INHALATION_SPRAY | Freq: Two times a day (BID) | RESPIRATORY_TRACT | Status: DC
  Administered 2015-08-31: 2 via RESPIRATORY_TRACT
  Filled 2015-08-31: qty 8.8

## 2015-08-31 MED ORDER — SALINE SPRAY 0.65 % NA SOLN
1.0000 | Freq: Once | NASAL | Status: AC
  Administered 2015-08-31: 1 via NASAL
  Filled 2015-08-31: qty 44

## 2015-08-31 NOTE — ED Provider Notes (Signed)
MSE was initiated and I personally evaluated the patient and placed orders (if any) at  11:41 AM on August 31, 2015.  The patient appears stable so that the remainder of the MSE may be completed by another provider. BP 121/83 mmHg  Pulse 83  Temp(Src) 97.8 F (36.6 C) (Oral)  Resp 20  SpO2 99%  Subjective-this is a 70 year old male with a past medical history of sarcoid. He presents emergency Department with chief complaint of 6 days of bloody cough. He history of the same. He is not currently taking any prednisone or disease modifying agents. He has been having soaking night sweats. He denies unexplained weight loss. He denies shortness of breath that is different from his normal shortness of breath.  Objective-well-developed, well-nourished male appearing stated age, in no acute distress. He has grossly bloody cough with small blood clots present in the trashcan. Lung exam: Effort is normal, he has decreased air movement, no wheezes or rales. Heart regular rate and rhythm  Assessment and plan: Patient with hemoptysis, risk factors for infection, cancer, pulmonary embolus. I doubt PE as patient is not tachycardic or hypoxic. He has no respiratory distress or shortness of breath. History removed to negative pressure room, basic labs initiated, chest x-ray pending.   Margarita Mail, PA-C 08/31/15 Angelica, MD 08/31/15 [phone]

## 2015-08-31 NOTE — ED Notes (Signed)
Bed: [id number] Expected date:  Expected time:  Means of arrival:  Comments: Hold for fast track

## 2015-08-31 NOTE — ED Provider Notes (Signed)
CSN: [id number]     Arrival date & time 08/31/15  82 History   First MD Initiated Contact with Patient 08/31/15 1140     Chief Complaint  Patient presents with  . Nasal Congestion  . Cough  . Hemoptysis    HPI  Patient presents with about one week of cough, congestion, hemoptysis, though no chest pain, fever, chills. Patient acknowledges a history of pulmonary sarcoidosis. He is not currently taking any steroids or disease modifying agents. He has not seen pulmonology in some time. No clear precipitant, and since onset, no clear alleviating or exacerbating factors beyond positioning. Patient feels as though upon awakening each day, with upright positioning he has several episodes of hemoptysis, though these improved during the course of the day.   Past Medical History  Diagnosis Date  . Sarcoidosis ([place])   . Hx of nonmelanoma skin cancer   . Upper GI bleeding     2013 upper GI bleed   . Hepatitis C reactive    Past Surgical History  Procedure Laterality Date  . Cancer on neck  2010    back of neck  . Hernia repair  1982  . Video bronchoscopy Bilateral 09/15/2013    Procedure: VIDEO BRONCHOSCOPY WITH FLUORO;  Surgeon: Kathee Delton, MD;  Location: [facility];  Service: Cardiopulmonary;  Laterality: Bilateral;  . Video bronchoscopy Bilateral 12/16/2013    Procedure: VIDEO BRONCHOSCOPY WITHOUT FLUORO;  Surgeon: Kathee Delton, MD;  Location: [facility];  Service: Cardiopulmonary;  Laterality: Bilateral;   Family History  Problem Relation Age of Onset  . Hypertension Mother   . Heart failure Father   . Cancer - Prostate Brother    Social History  Substance Use Topics  . Smoking status: Never Smoker   . Smokeless tobacco: Never Used  . Alcohol Use: No    Review of Systems  Constitutional:       Per HPI, otherwise negative  HENT:       Per HPI, otherwise negative  Respiratory:       Per HPI, otherwise negative  Cardiovascular:       Per HPI, otherwise negative   Gastrointestinal: Negative for vomiting.  Endocrine:       Negative aside from HPI  Genitourinary:       Neg aside from HPI   Musculoskeletal:       Per HPI, otherwise negative  Skin: Negative.   Allergic/Immunologic: Positive for immunocompromised state.  Neurological: Negative for syncope.      Allergies  Penicillins  Home Medications   Prior to Admission medications   Medication Sig Start Date End Date Taking? Authorizing Provider  COMBIGAN 0.2-0.5 % ophthalmic solution Place 1 drop into both eyes 2 (two) times daily. As directed 06/19/14  Yes Historical Provider, MD  Fluticasone Furoate-Vilanterol (BREO ELLIPTA) 100-25 MCG/INH AEPB Inhale 1 puff into the lungs daily. Patient taking differently: Inhale 1 puff into the lungs daily as needed (shortness of breath).  10/13/14  Yes Kathee Delton, MD  latanoprost (XALATAN) 0.005 % ophthalmic solution Place 1 drop into both eyes at bedtime. As directed 06/19/14  Yes Historical Provider, MD  HYDROcodone-acetaminophen (NORCO/VICODIN) 5-325 MG per tablet Take 2 tablets by mouth every 4 (four) hours as needed. Patient not taking: Reported on 06/03/2015 03/08/15   Forde Dandy, MD  predniSONE (DELTASONE) 20 MG tablet Take 3 tablets (60 mg total) by mouth daily with breakfast. 09/01/15 09/04/15  Carmin Muskrat, MD   BP 121/83 mmHg  Pulse  83  Temp(Src) 97.8 F (36.6 C) (Oral)  Resp 20  SpO2 99% Physical Exam  Constitutional: He is oriented to person, place, and time. He appears well-developed. No distress.  HENT:  Head: Normocephalic and atraumatic.  Eyes: Conjunctivae and EOM are normal.  Cardiovascular: Normal rate and regular rhythm.   Pulmonary/Chest: Effort normal. No stridor. He has decreased breath sounds.  Abdominal: He exhibits no distension.  Musculoskeletal: He exhibits no edema.  Neurological: He is alert and oriented to person, place, and time.  Skin: Skin is warm and dry.  Psychiatric: He has a normal mood and affect.   Nursing note and vitals reviewed.   ED Course  Procedures (including critical care time) Labs Review Labs Reviewed  CBC WITH DIFFERENTIAL/PLATELET - Abnormal; Notable for the following:    Hemoglobin 12.6 (*)    All other components within normal limits  COMPREHENSIVE METABOLIC PANEL - Abnormal; Notable for the following:    Glucose, Bld 106 (*)    Calcium 8.6 (*)    Total Protein 9.8 (*)    Albumin 2.9 (*)    AST 49 (*)    All other components within normal limits    Imaging Review Dg Chest 2 View  08/31/2015  CLINICAL DATA:  Shortness of breath and hemoptysis, 6 days duration. Personal history of sarcoid. EXAM: CHEST  2 VIEW COMPARISON:  03/08/2015 and multiple previous FINDINGS: Heart size is normal. Chronic prominence of the hilar shadows. Chronic widespread pulmonary opacity again demonstrated with a micronodular pattern consistent with the given diagnosis of sarcoid. There is no area of dense consolidation, collapse or effusion. Certainly, pneumonia could be hidden amongst the extensive chronic changes. This pulmonary opacity is worsening over time. IMPRESSION: Widespread pulmonary opacity and hilar prominence consistent with the clinical diagnosis of sarcoid. Findings are progressive over time. There is no dense consolidation or lobar collapse. Certainly however, pneumonia could be hidden amongst the extensive chronic changes. Electronically Signed   By: Nelson Chimes M.D.   On: 08/31/2015 11:43   Ct Chest Wo Contrast  08/31/2015  CLINICAL DATA:  Hemoptysis for 5 days. History of sarcoidosis. No reported respiratory distress or shortness breath. EXAM: CT CHEST WITHOUT CONTRAST TECHNIQUE: Multidetector CT imaging of the chest was performed following the standard protocol without IV contrast. COMPARISON:  Radiographs today.  CT 03/08/2015 and 08/29/2013. FINDINGS: Mediastinum/Nodes: Numerous partially calcified mediastinal and hilar lymph nodes are again noted, not grossly changed.  There is no axillary lymphadenopathy. The thyroid gland, trachea and esophagus demonstrate no significant findings. The heart size is normal. There is no pericardial effusion. No significant vascular findings are seen on noncontrast imaging. Lungs/Pleura: There is no pleural effusion. Again demonstrated is widespread chronic lung disease with thickening of the bronchovascular bundles and diffuse reticular nodular densities with an upper lobe predominance. These have not significantly progressed from the most recent examination, although have increased from 2015. Subpleural density posteriorly in the right lower lobe on images 45 and 46 is unchanged. There are scattered calcified granulomas. No confluent airspace opacity or developing mass lesion identified. Upper abdomen: The visualized upper abdomen appears grossly stable with multiple small upper abdominal lymph nodes, likely related to the patient's sarcoidosis. The spleen is mildly enlarged. Musculoskeletal/Chest wall: There is no chest wall mass or suspicious osseous finding. Sclerosis anteriorly in the left fifth rib is unchanged. There are probable small cervical ribs, left greater than right. IMPRESSION: 1. No apparent acute findings or clear explanation for the patient's hemoptysis. 2. Stable extensive  chronic lung disease attributed to sarcoidosis. Associated calcified mediastinal and hilar lymph nodes bilaterally. 3. Mild splenomegaly. Electronically Signed   By: Richardean Sale M.D.   On: 08/31/2015 13:51   I have personally reviewed and evaluated these images and lab results as part of my medical decision-making.  EMR notable for multiple evaluations with pulmonology, though none since almost 1 year ago. On repeat exam clarified for the patient that he has not been released from the pulmonology clinic, as he believes. We discussed all findings of CT scan, labs.  Patient will start inhaled available steroid fluticasone, though with the prior  pulmonology notes said a different inhaled steroid was desirable, this is not available.   MDM   Final diagnoses:  Dyspnea   patient with sarcoidosis presents with ongoing mild dyspnea, hemoptysis, cough. Here, there is no evidence for concurrent pneumonia, and the patient's labs are reassuring, with no substantial anemia either. Patient has no physical exam findings consistent with pulmonary embolism. Chart review notable for the initiation of multiple evaluations for sarcoidosis, and prior control with steroids. Patient restarted on steroids, both systemic and inhaled. His also provided inhaled nasal saline to assist with congestion. Patient will follow up with pulmonology.   Carmin Muskrat, MD 08/31/15 1434

## 2015-08-31 NOTE — ED Notes (Signed)
Pt coughed up dark bloody secretions. PA advised

## 2015-08-31 NOTE — Discharge Instructions (Signed)
As discussed, with history of sarcoidosis it is important to use the prescribed medication, as well as the provided medication as directed.  Specifically, use the provided inhaler daily, and the provided nasal spray 3 times daily until you have seen in your pulmonologist.  Return here for concerning changes in your condition.

## 2015-08-31 NOTE — ED Notes (Addendum)
Pt with sinus congestion and cough x 1 week.  Unknown for fever at home.  Has not checked.  No n/v.  Pt not taking any medications at home for symptoms.  Pt describes symptoms and discomfort more in sinus that with cough.

## 2015-09-05 ENCOUNTER — Ambulatory Visit: Payer: Commercial Managed Care - HMO | Admitting: Pulmonary Disease

## 2015-09-05 DIAGNOSIS — R040 Epistaxis: Secondary | ICD-10-CM | POA: Diagnosis not present

## 2015-09-19 ENCOUNTER — Other Ambulatory Visit: Payer: Self-pay | Admitting: *Deleted

## 2015-09-22 DIAGNOSIS — J069 Acute upper respiratory infection, unspecified: Secondary | ICD-10-CM | POA: Diagnosis not present

## 2015-09-26 ENCOUNTER — Other Ambulatory Visit: Payer: Self-pay | Admitting: *Deleted

## 2015-09-26 DIAGNOSIS — F329 Major depressive disorder, single episode, unspecified: Secondary | ICD-10-CM

## 2015-09-26 DIAGNOSIS — F32A Depression, unspecified: Secondary | ICD-10-CM

## 2015-09-26 NOTE — Patient Outreach (Signed)
High Risk Screening completed. Pt does not have nursing needs but does have some LCSW needs: Pt is mildly depressed and could benefit from some counseling. I am also letting Dr. Amedeo Kinsman know about his PQH9 score. Also he has dental needs that he is struggling to afford. He says he will go back to [employer] tomorrow to see if they can do anything else for him.  Depression screen [facility] 2/9 09/26/2015 06/03/2015 10/05/2014 08/03/2014 08/03/2014  Decreased Interest 1 0 0 0 0  Down, Depressed, Hopeless 1 0 0 0 0  PHQ - 2 Score 2 0 0 0 0  Altered sleeping 0 - - - -  Tired, decreased energy 1 - - - -  Change in appetite 0 - - - -  Feeling bad or failure about yourself  3 - - - -  Trouble concentrating 0 - - - -  Moving slowly or fidgety/restless 0 - - - -  Suicidal thoughts 0 - - - -  PHQ-9 Score 6 - - - -  Difficult doing work/chores Not difficult at all - - - Deloria Lair [facility] [place] [phone]

## 2015-10-17 ENCOUNTER — Other Ambulatory Visit: Payer: Self-pay | Admitting: Licensed Clinical Social Worker

## 2015-10-17 NOTE — Patient Outreach (Addendum)
[name]) Care Management  10/17/2015  [name] Jun 10, 1946 [id number]   Assessment-This CSW is covering for [employer] Referral from [place] stating that patient is interested in receiving both dental and mental health resources. CSW completed outreach. Patient answered. HIPPA verifications provided. CSW introduced self, reason for call and of [facility] services. Patient agreeable to referral. Patient reports that he had teeth pulled at [facility] but still needs dental assistance. Patient denies any dental pain at this time. Patient reports he will have difficulty affording partial dentures. CSW will mail patient dental resources but reminded him that there are not many dental resources available. Patient questioned if he was in need of mental health resources. Patient reports that he does not have depression but "I take on too much. I try to save the world. I always give everyone I care about good advice and I'm getting tired of it." Patient was educated on appropriate boundaries to put in place and the importance of self care methods. Patient is agreeable to CSW mailing out mental health resources but is unsure if he will need them. Patient also interested in gaining life insurance and would like information on various companies. CSW will mail out a list of life insurance companies and it will be up to patient to choose and decide as CSW is not recommending but providing information.  Plan-CSW will sent request to [place] Management Assistant to mail out both dental, life insurance and mental health resources at this time. CSW will update CSW [employer]  [name], BSW, MSW, [name] Phone: [phone] Fax: [phone]

## 2015-10-18 NOTE — Patient Outreach (Signed)
[facility]) Care Management  10/18/2015  [name] [birth year] [id number]   Request received from Eula Fried, [place] (coverage for [employer], LCSW) to mail patient mental health, dental, and life insurance resources. Information mailed today, 10/18/15.

## 2015-12-27 ENCOUNTER — Emergency Department (HOSPITAL_COMMUNITY)
Admission: EM | Admit: 2015-12-27 | Discharge: 2015-12-27 | Disposition: A | Discharge status: Home / Self Care | Payer: Commercial Managed Care - HMO | Attending: Emergency Medicine | Admitting: Emergency Medicine

## 2015-12-27 ENCOUNTER — Encounter (HOSPITAL_COMMUNITY): Payer: Self-pay

## 2015-12-27 DIAGNOSIS — Z79899 Other long term (current) drug therapy: Secondary | ICD-10-CM | POA: Insufficient documentation

## 2015-12-27 DIAGNOSIS — Z85828 Personal history of other malignant neoplasm of skin: Secondary | ICD-10-CM | POA: Diagnosis not present

## 2015-12-27 DIAGNOSIS — M436 Torticollis: Secondary | ICD-10-CM | POA: Insufficient documentation

## 2015-12-27 DIAGNOSIS — M542 Cervicalgia: Secondary | ICD-10-CM | POA: Diagnosis present

## 2015-12-27 MED ORDER — DIAZEPAM 5 MG PO TABS: 5.0000 mg | ORAL_TABLET | Freq: Four times a day (QID) | ORAL | Status: DC | PRN

## 2015-12-27 MED ORDER — KETOROLAC TROMETHAMINE 60 MG/2ML IM SOLN
60.0000 mg | Freq: Once | INTRAMUSCULAR | Status: AC
  Administered 2015-12-27: 60 mg via INTRAMUSCULAR
  Filled 2015-12-27: qty 2

## 2015-12-27 MED ORDER — KETOROLAC TROMETHAMINE 30 MG/ML IJ SOLN
60.0000 mg | Freq: Once | INTRAMUSCULAR | Status: DC
Start: 2015-12-27 — End: 2015-12-27
  Filled 2015-12-27: qty 2

## 2015-12-27 NOTE — Discharge Instructions (Signed)
Use heat on the sore area 3 or 4 times a day for 1 hour. Take ibuprofen 400 mg 3 times a day with meals for 1 week. Do not drive, when taking the muscle relaxer.     Acute Torticollis Torticollis is a condition in which the muscles of the neck tighten (contract) abnormally, causing the neck to twist and the head to move into an unnatural position. Torticollis that develops suddenly is called acute torticollis. If torticollis becomes chronic and is left untreated, the face and neck can become deformed. CAUSES This condition may be caused by:  Sleeping in an awkward position (common).  Extending or twisting the neck muscles beyond their normal position.  Infection. In some cases, the cause may not be known. SYMPTOMS Symptoms of this condition include:  An unnatural position of the head.  Neck pain.  A limited ability to move the neck.  Twisting of the neck to one side. DIAGNOSIS This condition is diagnosed with a physical exam. You may also have imaging tests, such as an X-ray, CT scan, or MRI. TREATMENT Treatment for this condition involves trying to relax the neck muscles. It may include:  Medicines or shots.  Physical therapy.  Surgery. This may be done in severe cases. HOME CARE INSTRUCTIONS  Take medicines only as directed by your health care provider.  Do stretching exercises and massage your neck as directed by your health care provider.  Keep all follow-up visits as directed by your health care provider. This is important. SEEK MEDICAL CARE IF:  You develop a fever. SEEK IMMEDIATE MEDICAL CARE IF:  You develop difficulty breathing.  You develop noisy breathing (stridor).  You start drooling.  You have trouble swallowing or have pain with swallowing.  You develop numbness or weakness in your hands or feet.  You have changes in your speech, understanding, or vision.  Your pain gets worse.   This information is not intended to replace advice given to  you by your health care provider. Make sure you discuss any questions you have with your health care provider.   Document Released: 06/29/2000 Document Revised: 11/16/2014 Document Reviewed: 06/28/2014 Elsevier Interactive Patient Education [employer].

## 2015-12-27 NOTE — ED Notes (Signed)
Patient states he took a nap on his couch yesterday evening and woke up with a severe "crick in my neck".  Patient states he used ibuprofen and cream with no relief.

## 2015-12-27 NOTE — ED Notes (Signed)
Patient has severe pain on palpation and movement to base of skull

## 2015-12-27 NOTE — ED Provider Notes (Signed)
CSN: [id number]     Arrival date & time 12/27/15  [id number] History   First MD Initiated Contact with Patient 12/27/15 [phone]     Chief Complaint  Patient presents with  . Neck Pain     (Consider location/radiation/quality/duration/timing/severity/associated sxs/prior Treatment) HPI  [name] is a 70 y.o. male who presents for evaluation of pain in upper neck, which started yesterday after taking a nap on the couch. No known trauma. Pain is worse with palpation, movement since that time. No radicular symptoms. No recent change in chronic lung disease. He states he is off his BREO, because his doctor told him to stop it. He denies fever, chills, cough, weakness, dizziness, nausea, vomiting. He drove his car here to be evaluated. There are no other known modifying factors.   Past Medical History  Diagnosis Date  . Sarcoidosis ([place])   . Hx of nonmelanoma skin cancer   . Upper GI bleeding     2013 upper GI bleed   . Hepatitis C reactive    Past Surgical History  Procedure Laterality Date  . Cancer on neck  2010    back of neck  . Hernia repair  1982  . Video bronchoscopy Bilateral 09/15/2013    Procedure: VIDEO BRONCHOSCOPY WITH FLUORO;  Surgeon: Kathee Delton, MD;  Location: [facility];  Service: Cardiopulmonary;  Laterality: Bilateral;  . Video bronchoscopy Bilateral 12/16/2013    Procedure: VIDEO BRONCHOSCOPY WITHOUT FLUORO;  Surgeon: Kathee Delton, MD;  Location: [facility];  Service: Cardiopulmonary;  Laterality: Bilateral;   Family History  Problem Relation Age of Onset  . Hypertension Mother   . Heart failure Father   . Cancer - Prostate Brother    Social History  Substance Use Topics  . Smoking status: Never Smoker   . Smokeless tobacco: Never Used  . Alcohol Use: No    Review of Systems  All other systems reviewed and are negative.     Allergies  Penicillins  Home Medications   Prior to Admission medications   Medication Sig Start Date End Date Taking?  Authorizing Provider  COMBIGAN 0.2-0.5 % ophthalmic solution Place 1 drop into both eyes 2 (two) times daily. As directed 06/19/14  Yes Historical Provider, MD  Fluticasone Furoate-Vilanterol (BREO ELLIPTA) 100-25 MCG/INH AEPB Inhale 1 puff into the lungs daily. Patient taking differently: Inhale 1 puff into the lungs daily as needed (shortness of breath).  10/13/14  Yes Kathee Delton, MD  ibuprofen (ADVIL,MOTRIN) 200 MG tablet Take 400 mg by mouth every 6 (six) hours as needed (Pain).   Yes Historical Provider, MD  latanoprost (XALATAN) 0.005 % ophthalmic solution Place 1 drop into both eyes at bedtime. As directed 06/19/14  Yes Historical Provider, MD  diazepam (VALIUM) 5 MG tablet Take 1 tablet (5 mg total) by mouth every 6 (six) hours as needed (muscle spasms). 12/27/15   Daleen Bo, MD   BP 140/101 mmHg  Pulse 68  Temp(Src) 97.7 F (36.5 C) (Oral)  Resp 20  Ht 5\' 11"  (1.803 m)  Wt 175 lb (79.379 kg)  BMI 24.42 kg/m2  SpO2 100% Physical Exam  Constitutional: He is oriented to person, place, and time. He appears well-developed and well-nourished. He appears distressed.  HENT:  Head: Normocephalic and atraumatic.  Right Ear: External ear normal.  Left Ear: External ear normal.  Eyes: Conjunctivae and EOM are normal. Pupils are equal, round, and reactive to light.  Neck: Normal range of motion and phonation normal. Neck supple.  Cardiovascular: Normal rate, regular rhythm and normal heart sounds.   Pulmonary/Chest: Effort normal and breath sounds normal. He exhibits no bony tenderness.  Musculoskeletal: Normal range of motion.  Tender right upper sternocleidomastoid muscle, with increased pain at this site with attempted left lateral rotation. No tenderness of the posterior cervical spine or left paravertebral musculature. Range of motion neck, limited by pain.  Neurological: He is alert and oriented to person, place, and time. No cranial nerve deficit or sensory deficit. He exhibits  normal muscle tone. Coordination normal.  Skin: Skin is warm, dry and intact.  Psychiatric: He has a normal mood and affect. His behavior is normal. Judgment and thought content normal.  Nursing note and vitals reviewed.   ED Course  Procedures (including critical care time)  Medications  ketorolac (TORADOL) injection 60 mg (60 mg Intramuscular Given 12/27/15 0805)    Patient Vitals for the past 24 hrs:  BP Temp Temp src Pulse Resp SpO2 Height Weight  12/27/15 0625 - - - - - - 5\' 11"  (1.803 m) 175 lb (79.379 kg)  12/27/15 0621 (!) 140/101 mmHg 97.7 F (36.5 C) Oral 68 20 100 % 5\' 11"  (1.803 m) 175 lb 6.4 oz (79.561 kg)    8:38 AM Reevaluation with update and discussion. After initial assessment and treatment, an updated evaluation reveals No additional complaints. Findings discussed with the patient and all questions answered.. [place] Review Labs Reviewed - No data to display  Imaging Review No results found. I have personally reviewed and evaluated these images and lab results as part of my medical decision-making.   EKG Interpretation None      MDM   Final diagnoses:  Torticollis    Elevation consistent with muscle strain, neck, torticollis. Doubt fracture, spinal myelopathy, meningitis, or metabolic instability.  Nursing Notes Reviewed/ Care Coordinated Applicable Imaging Reviewed Interpretation of Laboratory Data incorporated into ED treatment  The patient appears reasonably screened and/or stabilized for discharge and I doubt any other medical condition or other [facility] requiring further screening, evaluation, or treatment in the ED at this time prior to discharge.  Plan: Home Medications- IBU, Valium; Home Treatments- rest, heat; return here if the recommended treatment, does not improve the symptoms; Recommended follow up- PCP prn   Daleen Bo, MD 12/27/15 [phone]

## 2016-01-11 DIAGNOSIS — K6289 Other specified diseases of anus and rectum: Secondary | ICD-10-CM | POA: Diagnosis not present

## 2016-01-11 DIAGNOSIS — R21 Rash and other nonspecific skin eruption: Secondary | ICD-10-CM | POA: Diagnosis not present

## 2016-01-11 DIAGNOSIS — L2082 Flexural eczema: Secondary | ICD-10-CM | POA: Diagnosis not present

## 2016-02-06 DIAGNOSIS — N3001 Acute cystitis with hematuria: Secondary | ICD-10-CM | POA: Diagnosis not present

## 2016-02-06 DIAGNOSIS — R3989 Other symptoms and signs involving the genitourinary system: Secondary | ICD-10-CM | POA: Diagnosis not present

## 2016-03-09 ENCOUNTER — Other Ambulatory Visit: Payer: Self-pay | Admitting: Internal Medicine

## 2016-03-09 ENCOUNTER — Ambulatory Visit
Admission: RE | Admit: 2016-03-09 | Discharge: 2016-03-09 | Disposition: A | Discharge status: Home / Self Care | Payer: Commercial Managed Care - HMO | Source: Ambulatory Visit | Attending: Internal Medicine | Admitting: Internal Medicine

## 2016-03-09 DIAGNOSIS — J209 Acute bronchitis, unspecified: Secondary | ICD-10-CM | POA: Diagnosis not present

## 2016-03-09 DIAGNOSIS — J069 Acute upper respiratory infection, unspecified: Secondary | ICD-10-CM | POA: Diagnosis not present

## 2016-03-09 DIAGNOSIS — R05 Cough: Secondary | ICD-10-CM | POA: Diagnosis not present

## 2016-03-09 DIAGNOSIS — D860 Sarcoidosis of lung: Secondary | ICD-10-CM | POA: Diagnosis not present

## 2016-03-15 ENCOUNTER — Other Ambulatory Visit (INDEPENDENT_AMBULATORY_CARE_PROVIDER_SITE_OTHER): Payer: Commercial Managed Care - HMO

## 2016-03-15 ENCOUNTER — Encounter: Payer: Self-pay | Admitting: Pulmonary Disease

## 2016-03-15 ENCOUNTER — Ambulatory Visit (INDEPENDENT_AMBULATORY_CARE_PROVIDER_SITE_OTHER): Payer: Commercial Managed Care - HMO | Admitting: Pulmonary Disease

## 2016-03-15 VITALS — BP 114/64 | HR 56 | Ht 71.0 in | Wt 179.4 lb

## 2016-03-15 DIAGNOSIS — R222 Localized swelling, mass and lump, trunk: Secondary | ICD-10-CM | POA: Diagnosis not present

## 2016-03-15 DIAGNOSIS — D869 Sarcoidosis, unspecified: Secondary | ICD-10-CM

## 2016-03-15 DIAGNOSIS — R918 Other nonspecific abnormal finding of lung field: Secondary | ICD-10-CM

## 2016-03-15 LAB — COMPREHENSIVE METABOLIC PANEL
ALBUMIN: 3.1 g/dL — AB (ref 3.5–5.2)
ALK PHOS: 41 U/L (ref 39–117)
ALT: 18 U/L (ref 0–53)
AST: 20 U/L (ref 0–37)
BILIRUBIN TOTAL: 0.6 mg/dL (ref 0.2–1.2)
BUN: 18 mg/dL (ref 6–23)
CALCIUM: 8.4 mg/dL (ref 8.4–10.5)
CHLORIDE: 104 meq/L (ref 96–112)
CO2: 32 mEq/L (ref 19–32)
CREATININE: 0.77 mg/dL (ref 0.40–1.50)
GFR: 129.31 mL/min (ref >60.00)
Glucose, Bld: 88 mg/dL (ref 70–99)
Potassium: 3.6 mEq/L (ref 3.5–5.1)
SODIUM: 136 meq/L (ref 135–145)
TOTAL PROTEIN: 8.9 g/dL — AB (ref 6.0–8.3)

## 2016-03-15 LAB — CBC WITH DIFFERENTIAL/PLATELET
BASOS ABS: 0.1 10*3/uL (ref 0.0–0.1)
Basophils Relative: 1 % (ref 0.0–3.0)
EOS ABS: 0.1 10*3/uL (ref 0.0–0.7)
Eosinophils Relative: 0.5 % (ref 0.0–5.0)
HCT: 39.7 % (ref 39.0–52.0)
HEMOGLOBIN: 13.6 g/dL (ref 13.0–17.0)
LYMPHS ABS: 2.4 10*3/uL (ref 0.7–4.0)
Lymphocytes Relative: 23.6 % (ref 12.0–46.0)
MCHC: 34.3 g/dL (ref 30.0–36.0)
MCV: 84 fl (ref 78.0–100.0)
Monocytes Absolute: 1 10*3/uL (ref 0.1–1.0)
Monocytes Relative: 10.2 % (ref 3.0–12.0)
NEUTROS PCT: 64.7 % (ref 43.0–77.0)
Neutro Abs: 6.6 10*3/uL (ref 1.4–7.7)
Platelets: 216 10*3/uL (ref 150.0–400.0)
RBC: 4.72 Mil/uL (ref 4.22–5.81)
RDW: 15.7 % — ABNORMAL HIGH (ref 11.5–15.5)
WBC: 10.1 10*3/uL (ref 4.0–10.5)

## 2016-03-15 MED ORDER — FLUTICASONE FUROATE-VILANTEROL 100-25 MCG/INH IN AEPB: 1.0000 | INHALATION_SPRAY | Freq: Every day | RESPIRATORY_TRACT | 5 refills | Status: DC

## 2016-03-15 NOTE — Progress Notes (Signed)
[name]    [id number]    02/18/1946  Primary Care Physician:Rupashree Fara Olden, MD  Referring Physician: Lottie Dawson, MD [place]  [address] [place], [place] [address]  Chief complaint:  Follow up for sarcoidosis  HPI: Mr. Siefken is a 70 year old with past medical history of biopsy-proven sarcoidosis. The diagnosis was made to transbronchial biopsies in 2015. He also has a persistent endobronchial abnormality in the posterior basilar segment of the right lower lobe. Endobronchial biopsies did not reveal any malignancy. He had been maintained on chronic prednisone for some time. He was eventually tapered off the prednisone and started on an ICS/LABA combination. He stopped taking this too a year ago and had remained asymptomatic.  He had an episode of respiratory tract infection 5 days ago. He was evaluated by chest x-ray which showed chronic interstitial markings. He was given a 12 day taper of prednisone, Mucinex, Z-Pak. He feels his symptoms have improved but he still has persistent cough, wheezing. He denies any sputum production, fevers, chills.   Outpatient Encounter Prescriptions as of 03/15/2016  Medication Sig  . Dextromethorphan-Guaifenesin (MUCINEX DM) 30-600 MG TB12 Take 1-2 tablets by mouth 2 (two) times daily as needed.  Marland Kitchen ibuprofen (ADVIL,MOTRIN) 200 MG tablet Take 400 mg by mouth every 6 (six) hours as needed (Pain).  . predniSONE (DELTASONE) 10 MG tablet Take as directed  . COMBIGAN 0.2-0.5 % ophthalmic solution Place 1 drop into both eyes 2 (two) times daily. As directed  . diazepam (VALIUM) 5 MG tablet Take 1 tablet (5 mg total) by mouth every 6 (six) hours as needed (muscle spasms). (Patient not taking: Reported on 03/15/2016)  . Fluticasone Furoate-Vilanterol (BREO ELLIPTA) 100-25 MCG/INH AEPB Inhale 1 puff into the lungs daily. (Patient not taking: Reported on 03/15/2016)  . latanoprost (XALATAN) 0.005 % ophthalmic solution Place 1  drop into both eyes at bedtime. As directed   No facility-administered encounter medications on file as of 03/15/2016.     Allergies as of 03/15/2016 - Review Complete 03/15/2016  Allergen Reaction Noted  . Penicillins Other (See Comments) 08/24/2011    Past Medical History:  Diagnosis Date  . Hepatitis C reactive   . Hx of nonmelanoma skin cancer   . Sarcoidosis ([place])   . Upper GI bleeding    2013 upper GI bleed     Past Surgical History:  Procedure Laterality Date  . cancer on neck  2010   back of neck  . HERNIA REPAIR  1982  . VIDEO BRONCHOSCOPY Bilateral 09/15/2013   Procedure: VIDEO BRONCHOSCOPY WITH FLUORO;  Surgeon: Kathee Delton, MD;  Location: [facility];  Service: Cardiopulmonary;  Laterality: Bilateral;  . VIDEO BRONCHOSCOPY Bilateral 12/16/2013   Procedure: VIDEO BRONCHOSCOPY WITHOUT FLUORO;  Surgeon: Kathee Delton, MD;  Location: [facility];  Service: Cardiopulmonary;  Laterality: Bilateral;    Family History  Problem Relation Age of Onset  . Hypertension Mother   . Heart failure Father   . Cancer - Prostate Brother     Social History   Social History  . Marital status: Single    Spouse name: N/A  . Number of children: N/A  . Years of education: N/A   Occupational History  . Not on file.   Social History Main Topics  . Smoking status: Never Smoker  . Smokeless tobacco: Never Used  . Alcohol use No  . Drug use: No  . Sexual activity: Not on file  Other Topics Concern  . Not on file   Social History Narrative  . No narrative on file    Review of systems: Review of Systems  Constitutional: Negative for fever and chills.  HENT: Negative.   Eyes: Negative for blurred vision.  Respiratory: as per HPI  Cardiovascular: Negative for chest pain and palpitations.  Gastrointestinal: Negative for vomiting, diarrhea, blood per rectum. Genitourinary: Negative for dysuria, urgency, frequency and hematuria.  Musculoskeletal: Negative for myalgias,  back pain and joint pain.  Skin: Negative for itching and rash.  Neurological: Negative for dizziness, tremors, focal weakness, seizures and loss of consciousness.  Endo/Heme/Allergies: Negative for environmental allergies.  Psychiatric/Behavioral: Negative for depression, suicidal ideas and hallucinations.  All other systems reviewed and are negative.  Physical Exam: Blood pressure 114/64, pulse (!) 56, height 5\' 11"  (1.803 m), weight 179 lb 6.4 oz (81.4 kg), SpO2 96 %. Gen:      No acute distress HEENT:  EOMI, sclera anicteric Neck:     No masses; no thyromegaly Lungs:    Clear to auscultation bilaterally; normal respiratory effort CV:         Regular rate and rhythm; no murmurs Abd:      + bowel sounds; soft, non-tender; no palpable masses, no distension Ext:    No edema; adequate peripheral perfusion Skin:      Warm and dry; no rash Neuro: alert and oriented x 3 Psych: normal mood and affect  Data Reviewed: Endobronchial biopsy 12/16/13- No granulomas or malignancy Transbronchial biopsies 09/15/13- Chronic granulomatous disease  PFTs 10/20/13 FVC 2.57 (37%) FEV1 2.6 on [5%) F/F 75 TLC 74% DLCO 60% Mild restriction with mild reduction in diffusion capacity.  Imaging  CT chest 08/31/15- Diffuse reticulonodular opacities, thickening of bronchovascular bundles. Calcified mediastinal and hilar lymph nodes. CXR 03/09/16- Chronic interstital infiltrates  Assessment:  Follow-up for biopsy proven sarcoidosis.  Mrs. [name] is just recovering from a respiratory tract infection. His CXR which was reviewed and shows chronic interstitial infiltrate which is not markedly abnormal from his prior imaging. Since he still has some respiratory symptoms of dyspnea, wheezing I'll restart him on the Breo inhaler and I'll assess him with basic lab tests, Ace level, EKG. He has a follow-up with ophthalmology ultimately be arranged for next week.  He will need a repeat CT and PFTs but I will wait till his  return in 3 months after he gets over this acute episode.  Plan/Recommendations: - Check CMP, CBC, ACE level, EKG - Opthalmology check up - Restart Arneta Cliche MD [place] [facility] Pager [phone] 03/15/2016, 10:53 AM  CC: Shamleffer, Ibethal Jar*

## 2016-03-15 NOTE — Patient Instructions (Addendum)
Continue the prednisone taper until is complete. We will restart you on Breo inhaler. Check labs today including EKG, Comp metabolic panel, ACE levels, CBC with diff.  Make sure you keep up your ophthalmology appointment. Return to clinic in 3 months when we'll reassess you with PFTs and a high res CT of the chest

## 2016-03-16 LAB — ANGIOTENSIN CONVERTING ENZYME: ANGIOTENSIN-CONVERTING ENZYME: 86 U/L — AB (ref 8–52)

## 2016-03-21 DIAGNOSIS — H401133 Primary open-angle glaucoma, bilateral, severe stage: Secondary | ICD-10-CM | POA: Diagnosis not present

## 2016-03-21 DIAGNOSIS — H43812 Vitreous degeneration, left eye: Secondary | ICD-10-CM | POA: Diagnosis not present

## 2016-03-21 DIAGNOSIS — H31092 Other chorioretinal scars, left eye: Secondary | ICD-10-CM | POA: Diagnosis not present

## 2016-03-21 DIAGNOSIS — H17822 Peripheral opacity of cornea, left eye: Secondary | ICD-10-CM | POA: Diagnosis not present

## 2016-03-21 DIAGNOSIS — H2513 Age-related nuclear cataract, bilateral: Secondary | ICD-10-CM | POA: Diagnosis not present

## 2016-03-23 DIAGNOSIS — R350 Frequency of micturition: Secondary | ICD-10-CM | POA: Diagnosis not present

## 2016-03-23 DIAGNOSIS — E876 Hypokalemia: Secondary | ICD-10-CM | POA: Diagnosis not present

## 2016-03-23 DIAGNOSIS — D860 Sarcoidosis of lung: Secondary | ICD-10-CM | POA: Diagnosis not present

## 2016-03-29 ENCOUNTER — Ambulatory Visit (INDEPENDENT_AMBULATORY_CARE_PROVIDER_SITE_OTHER)
Admission: RE | Admit: 2016-03-29 | Discharge: 2016-03-29 | Disposition: A | Discharge status: Home / Self Care | Payer: Commercial Managed Care - HMO | Source: Ambulatory Visit | Attending: Pulmonary Disease | Admitting: Pulmonary Disease

## 2016-03-29 DIAGNOSIS — D869 Sarcoidosis, unspecified: Secondary | ICD-10-CM

## 2016-03-29 DIAGNOSIS — J849 Interstitial pulmonary disease, unspecified: Secondary | ICD-10-CM | POA: Diagnosis not present

## 2016-04-02 NOTE — Progress Notes (Signed)
Called spoke with patient, advised of CT results / recs as stated by PM.  Pt verbalized his understanding and denied any questions.

## 2016-04-13 DIAGNOSIS — R21 Rash and other nonspecific skin eruption: Secondary | ICD-10-CM | POA: Diagnosis not present

## 2016-04-13 DIAGNOSIS — D860 Sarcoidosis of lung: Secondary | ICD-10-CM | POA: Diagnosis not present

## 2016-05-08 DIAGNOSIS — E7800 Pure hypercholesterolemia, unspecified: Secondary | ICD-10-CM | POA: Diagnosis not present

## 2016-05-08 DIAGNOSIS — Z1211 Encounter for screening for malignant neoplasm of colon: Secondary | ICD-10-CM | POA: Diagnosis not present

## 2016-05-08 DIAGNOSIS — B182 Chronic viral hepatitis C: Secondary | ICD-10-CM | POA: Diagnosis not present

## 2016-05-08 DIAGNOSIS — Z125 Encounter for screening for malignant neoplasm of prostate: Secondary | ICD-10-CM | POA: Diagnosis not present

## 2016-05-08 DIAGNOSIS — N400 Enlarged prostate without lower urinary tract symptoms: Secondary | ICD-10-CM | POA: Diagnosis not present

## 2016-05-08 DIAGNOSIS — R3121 Asymptomatic microscopic hematuria: Secondary | ICD-10-CM | POA: Diagnosis not present

## 2016-05-08 DIAGNOSIS — Z23 Encounter for immunization: Secondary | ICD-10-CM | POA: Diagnosis not present

## 2016-05-08 DIAGNOSIS — Z0000 Encounter for general adult medical examination without abnormal findings: Secondary | ICD-10-CM | POA: Diagnosis not present

## 2016-05-08 DIAGNOSIS — R7302 Impaired glucose tolerance (oral): Secondary | ICD-10-CM | POA: Diagnosis not present

## 2016-05-08 DIAGNOSIS — D860 Sarcoidosis of lung: Secondary | ICD-10-CM | POA: Diagnosis not present

## 2016-05-08 DIAGNOSIS — L2082 Flexural eczema: Secondary | ICD-10-CM | POA: Diagnosis not present

## 2016-05-08 DIAGNOSIS — Z131 Encounter for screening for diabetes mellitus: Secondary | ICD-10-CM | POA: Diagnosis not present

## 2016-05-23 DIAGNOSIS — K625 Hemorrhage of anus and rectum: Secondary | ICD-10-CM | POA: Diagnosis not present

## 2016-06-15 ENCOUNTER — Encounter: Payer: Self-pay | Admitting: Pulmonary Disease

## 2016-06-15 ENCOUNTER — Ambulatory Visit (INDEPENDENT_AMBULATORY_CARE_PROVIDER_SITE_OTHER): Payer: Commercial Managed Care - HMO | Admitting: Pulmonary Disease

## 2016-06-15 VITALS — BP 118/62 | HR 63 | Ht 71.0 in | Wt 182.4 lb

## 2016-06-15 DIAGNOSIS — D869 Sarcoidosis, unspecified: Secondary | ICD-10-CM

## 2016-06-15 NOTE — Patient Instructions (Signed)
Continue using Breo as prescribed. We'll send in a new prescription. Return to clinic in 6 months with pulmonary function tests.

## 2016-06-15 NOTE — Progress Notes (Signed)
[name]    [id number]    04/30/1946  Primary Care Physician:Rupashree Fara Olden, MD  Referring Physician: Leeroy Cha, MD [address] [place], [place] [address]  Chief complaint:  Follow up for sarcoidosis  HPI: [name] is a 70 year old with past medical history of biopsy-proven sarcoidosis. The diagnosis was made to transbronchial biopsies in 2015. He also has a persistent endobronchial abnormality in the posterior basilar segment of the right lower lobe. Endobronchial biopsies did not reveal any malignancy. He had been maintained on chronic prednisone for some time. He was eventually tapered off the prednisone and started on an ICS/LABA combination. He stopped taking this too a year ago and had remained asymptomatic.  He was restarted on the Breo this year after an episode of acute bronchitis with wheezing. He is doing well on with no new complaints. He has seen an ophthalmologist who told him that the sarcoidosis is not affecting his eyes. He is a nonsmoker. 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 COMBIGAN 0.2-0.5 % ophthalmic solution Place 1 drop into both eyes 2 (two) times daily. As directed  . fluticasone furoate-vilanterol (BREO ELLIPTA) 100-25 MCG/INH AEPB Inhale 1 puff into the lungs daily.  Marland Kitchen latanoprost (XALATAN) 0.005 % ophthalmic solution Place 1 drop into both eyes at bedtime. As directed  . [DISCONTINUED] Dextromethorphan-Guaifenesin (MUCINEX DM) 30-600 MG TB12 Take 1-2 tablets by mouth 2 (two) times daily as needed.  . [DISCONTINUED] diazepam (VALIUM) 5 MG tablet Take 1 tablet (5 mg total) by mouth every 6 (six) hours as needed (muscle spasms). (Patient not taking: Reported on 03/15/2016)  . [DISCONTINUED] ibuprofen (ADVIL,MOTRIN) 200 MG tablet Take 400 mg by mouth every 6 (six) hours as needed (Pain).  . [DISCONTINUED]  predniSONE (DELTASONE) 10 MG tablet Take as directed   No facility-administered encounter medications on file as of 06/15/2016.     Allergies as of 06/15/2016 - Review Complete 06/15/2016  Allergen Reaction Noted  . Penicillins Other (See Comments) 08/24/2011    Past Medical History:  Diagnosis Date  . Hepatitis C reactive   . Hx of nonmelanoma skin cancer   . Sarcoidosis ([place])   . Upper GI bleeding    2013 upper GI bleed     Past Surgical History:  Procedure Laterality Date  . cancer on neck  2010   back of neck  . HERNIA REPAIR  1982  . VIDEO BRONCHOSCOPY Bilateral 09/15/2013   Procedure: VIDEO BRONCHOSCOPY WITH FLUORO;  Surgeon: Kathee Delton, MD;  Location: [facility];  Service: Cardiopulmonary;  Laterality: Bilateral;  . VIDEO BRONCHOSCOPY Bilateral 12/16/2013   Procedure: VIDEO BRONCHOSCOPY WITHOUT FLUORO;  Surgeon: Kathee Delton, MD;  Location: [facility];  Service: Cardiopulmonary;  Laterality: Bilateral;    Family History  Problem Relation Age of Onset  . Hypertension Mother   . Heart failure Father   . Cancer - Prostate Brother     Social History   Social History  . Marital status: Single    Spouse name: N/A  . Number of children: N/A  . Years of education: N/A   Occupational History  . Not on file.   Social History Main Topics  . Smoking status: Never Smoker  . Smokeless tobacco: Never Used  . Alcohol use No  . Drug use: No  . Sexual activity: Not  on file   Other Topics Concern  . Not on file   Social History Narrative  . No narrative on file    Review of systems: Review of Systems  Constitutional: Negative for fever and chills.  HENT: Negative.   Eyes: Negative for blurred vision.  Respiratory: as per HPI  Cardiovascular: Negative for chest pain and palpitations.  Gastrointestinal: Negative for vomiting, diarrhea, blood per rectum. Genitourinary: Negative for dysuria, urgency, frequency and hematuria.  Musculoskeletal: Negative for  myalgias, back pain and joint pain.  Skin: Negative for itching and rash.  Neurological: Negative for dizziness, tremors, focal weakness, seizures and loss of consciousness.  Endo/Heme/Allergies: Negative for environmental allergies.  Psychiatric/Behavioral: Negative for depression, suicidal ideas and hallucinations.  All other systems reviewed and are negative.  Physical Exam: Blood pressure 114/64, pulse (!) 56, height 5\' 11"  (1.803 m), weight 179 lb 6.4 oz (81.4 kg), SpO2 96 %. Gen:      No acute distress HEENT:  EOMI, sclera anicteric Neck:     No masses; no thyromegaly Lungs:    Clear to auscultation bilaterally; normal respiratory effort CV:         Regular rate and rhythm; no murmurs Abd:      + bowel sounds; soft, non-tender; no palpable masses, no distension Ext:    No edema; adequate peripheral perfusion Skin:      Warm and dry; no rash Neuro: alert and oriented x 3 Psych: normal mood and affect  Data Reviewed: Endobronchial biopsy 12/16/13- No granulomas or malignancy Transbronchial biopsies 09/15/13- Chronic granulomatous disease  PFTs 10/20/13 FVC 3.43 [4%] FEV1 2.59 (83%) F/F 76 TLC 74% DLCO 62%. Mild restriction with mild reduction in diffusion capacity.  Imaging  CT chest 08/31/15- Diffuse reticulonodular opacities, thickening of bronchovascular bundles. Calcified mediastinal and hilar lymph nodes. CXR 03/09/16- Chronic interstital infiltrates   High res CT 03/29/16-diffuse interstitial lung disease. No change from 08/2015. All images reviewed.  Labs 03/15/16 CMP, CBC - WNL ACE- 86  Assessment:  Follow-up for biopsy proven sarcoidosis. Doing well on current inhalers breo. This was started earlier this year when he had an acute episode of bronchitis. We will continue this for now. I'll reassess at next visit with PFTs. If he continues to be stable he may be able to stop again.  Plan/Recommendations: - Surveyor, mining. Return in 6 months  Marshell Garfinkel MD [place]  [facility] Pager [phone] 06/15/2016, 11:07 AM  CC: Leeroy Cha,*

## 2016-07-23 DIAGNOSIS — K625 Hemorrhage of anus and rectum: Secondary | ICD-10-CM | POA: Diagnosis not present

## 2016-07-23 DIAGNOSIS — K648 Other hemorrhoids: Secondary | ICD-10-CM | POA: Diagnosis not present

## 2016-07-27 DIAGNOSIS — H43812 Vitreous degeneration, left eye: Secondary | ICD-10-CM | POA: Diagnosis not present

## 2016-07-27 DIAGNOSIS — H17822 Peripheral opacity of cornea, left eye: Secondary | ICD-10-CM | POA: Diagnosis not present

## 2016-07-27 DIAGNOSIS — H31092 Other chorioretinal scars, left eye: Secondary | ICD-10-CM | POA: Diagnosis not present

## 2016-07-27 DIAGNOSIS — H2513 Age-related nuclear cataract, bilateral: Secondary | ICD-10-CM | POA: Diagnosis not present

## 2016-07-27 DIAGNOSIS — H401133 Primary open-angle glaucoma, bilateral, severe stage: Secondary | ICD-10-CM | POA: Diagnosis not present

## 2016-09-14 DIAGNOSIS — R319 Hematuria, unspecified: Secondary | ICD-10-CM | POA: Diagnosis not present

## 2016-09-14 DIAGNOSIS — B182 Chronic viral hepatitis C: Secondary | ICD-10-CM | POA: Diagnosis not present

## 2016-09-14 DIAGNOSIS — N400 Enlarged prostate without lower urinary tract symptoms: Secondary | ICD-10-CM | POA: Diagnosis not present

## 2016-09-14 DIAGNOSIS — R7303 Prediabetes: Secondary | ICD-10-CM | POA: Diagnosis not present

## 2016-09-20 ENCOUNTER — Ambulatory Visit
Admission: RE | Admit: 2016-09-20 | Discharge: 2016-09-20 | Disposition: A | Discharge status: Home / Self Care | Payer: Medicare HMO | Source: Ambulatory Visit | Attending: Internal Medicine | Admitting: Internal Medicine

## 2016-09-20 ENCOUNTER — Other Ambulatory Visit: Payer: Self-pay | Admitting: Internal Medicine

## 2016-09-20 DIAGNOSIS — R319 Hematuria, unspecified: Secondary | ICD-10-CM

## 2016-09-20 DIAGNOSIS — R809 Proteinuria, unspecified: Secondary | ICD-10-CM

## 2016-09-20 DIAGNOSIS — D860 Sarcoidosis of lung: Secondary | ICD-10-CM | POA: Diagnosis not present

## 2016-09-20 DIAGNOSIS — N400 Enlarged prostate without lower urinary tract symptoms: Secondary | ICD-10-CM | POA: Diagnosis not present

## 2016-09-20 DIAGNOSIS — R3989 Other symptoms and signs involving the genitourinary system: Secondary | ICD-10-CM

## 2016-09-20 DIAGNOSIS — N059 Unspecified nephritic syndrome with unspecified morphologic changes: Secondary | ICD-10-CM | POA: Diagnosis not present

## 2016-09-20 DIAGNOSIS — B182 Chronic viral hepatitis C: Secondary | ICD-10-CM | POA: Diagnosis not present

## 2016-10-08 DIAGNOSIS — D869 Sarcoidosis, unspecified: Secondary | ICD-10-CM | POA: Diagnosis not present

## 2016-10-08 DIAGNOSIS — R310 Gross hematuria: Secondary | ICD-10-CM | POA: Diagnosis not present

## 2016-10-08 DIAGNOSIS — Z8619 Personal history of other infectious and parasitic diseases: Secondary | ICD-10-CM | POA: Diagnosis not present

## 2016-10-08 DIAGNOSIS — Z8719 Personal history of other diseases of the digestive system: Secondary | ICD-10-CM | POA: Diagnosis not present

## 2016-11-13 DIAGNOSIS — R3121 Asymptomatic microscopic hematuria: Secondary | ICD-10-CM | POA: Diagnosis not present

## 2016-11-13 DIAGNOSIS — N401 Enlarged prostate with lower urinary tract symptoms: Secondary | ICD-10-CM | POA: Diagnosis not present

## 2016-11-13 DIAGNOSIS — R350 Frequency of micturition: Secondary | ICD-10-CM | POA: Diagnosis not present

## 2016-11-14 DIAGNOSIS — H401133 Primary open-angle glaucoma, bilateral, severe stage: Secondary | ICD-10-CM | POA: Diagnosis not present

## 2016-11-14 DIAGNOSIS — H31092 Other chorioretinal scars, left eye: Secondary | ICD-10-CM | POA: Diagnosis not present

## 2016-11-14 DIAGNOSIS — H2513 Age-related nuclear cataract, bilateral: Secondary | ICD-10-CM | POA: Diagnosis not present

## 2016-11-14 DIAGNOSIS — H43812 Vitreous degeneration, left eye: Secondary | ICD-10-CM | POA: Diagnosis not present

## 2016-11-14 DIAGNOSIS — H17822 Peripheral opacity of cornea, left eye: Secondary | ICD-10-CM | POA: Diagnosis not present

## 2016-11-22 DIAGNOSIS — R3129 Other microscopic hematuria: Secondary | ICD-10-CM | POA: Diagnosis not present

## 2016-11-22 DIAGNOSIS — R3121 Asymptomatic microscopic hematuria: Secondary | ICD-10-CM | POA: Diagnosis not present

## 2016-11-27 ENCOUNTER — Other Ambulatory Visit: Payer: Self-pay | Admitting: Urology

## 2016-11-27 DIAGNOSIS — D378 Neoplasm of uncertain behavior of other specified digestive organs: Secondary | ICD-10-CM

## 2016-12-12 ENCOUNTER — Ambulatory Visit (HOSPITAL_COMMUNITY)
Admission: RE | Admit: 2016-12-12 | Discharge: 2016-12-12 | Disposition: A | Discharge status: Home / Self Care | Payer: Medicare HMO | Source: Ambulatory Visit | Attending: Urology | Admitting: Urology

## 2016-12-12 ENCOUNTER — Other Ambulatory Visit: Payer: Self-pay | Admitting: Urology

## 2016-12-12 DIAGNOSIS — K869 Disease of pancreas, unspecified: Secondary | ICD-10-CM | POA: Diagnosis not present

## 2016-12-12 DIAGNOSIS — D869 Sarcoidosis, unspecified: Secondary | ICD-10-CM | POA: Insufficient documentation

## 2016-12-12 DIAGNOSIS — K8689 Other specified diseases of pancreas: Secondary | ICD-10-CM

## 2016-12-12 DIAGNOSIS — D378 Neoplasm of uncertain behavior of other specified digestive organs: Secondary | ICD-10-CM

## 2016-12-12 DIAGNOSIS — R935 Abnormal findings on diagnostic imaging of other abdominal regions, including retroperitoneum: Secondary | ICD-10-CM | POA: Diagnosis not present

## 2016-12-12 HISTORY — DX: Other specified diseases of pancreas: K86.89

## 2016-12-12 MED ORDER — GADOBENATE DIMEGLUMINE 529 MG/ML IV SOLN
20.0000 mL | Freq: Once | INTRAVENOUS | Status: AC | PRN
  Administered 2016-12-12: 17 mL via INTRAVENOUS

## 2016-12-17 DIAGNOSIS — N323 Diverticulum of bladder: Secondary | ICD-10-CM

## 2016-12-17 HISTORY — DX: Diverticulum of bladder: N32.3

## 2016-12-24 DIAGNOSIS — N323 Diverticulum of bladder: Secondary | ICD-10-CM | POA: Diagnosis not present

## 2016-12-24 DIAGNOSIS — K869 Disease of pancreas, unspecified: Secondary | ICD-10-CM | POA: Diagnosis not present

## 2016-12-24 DIAGNOSIS — N329 Bladder disorder, unspecified: Secondary | ICD-10-CM | POA: Diagnosis not present

## 2016-12-24 DIAGNOSIS — R3121 Asymptomatic microscopic hematuria: Secondary | ICD-10-CM | POA: Diagnosis not present

## 2016-12-25 ENCOUNTER — Other Ambulatory Visit: Payer: Self-pay | Admitting: Urology

## 2016-12-26 ENCOUNTER — Encounter (HOSPITAL_BASED_OUTPATIENT_CLINIC_OR_DEPARTMENT_OTHER): Payer: Self-pay | Admitting: *Deleted

## 2016-12-27 DIAGNOSIS — R590 Localized enlarged lymph nodes: Secondary | ICD-10-CM | POA: Diagnosis not present

## 2016-12-27 DIAGNOSIS — R933 Abnormal findings on diagnostic imaging of other parts of digestive tract: Secondary | ICD-10-CM | POA: Diagnosis not present

## 2016-12-27 DIAGNOSIS — K8689 Other specified diseases of pancreas: Secondary | ICD-10-CM | POA: Diagnosis not present

## 2016-12-27 DIAGNOSIS — D378 Neoplasm of uncertain behavior of other specified digestive organs: Secondary | ICD-10-CM | POA: Diagnosis not present

## 2016-12-28 ENCOUNTER — Encounter (HOSPITAL_BASED_OUTPATIENT_CLINIC_OR_DEPARTMENT_OTHER): Payer: Self-pay | Admitting: *Deleted

## 2016-12-31 ENCOUNTER — Other Ambulatory Visit: Payer: Self-pay | Admitting: Urology

## 2017-01-03 MED ORDER — CEFAZOLIN SODIUM-DEXTROSE 2-4 GM/100ML-% IV SOLN
2.0000 g | Freq: Once | INTRAVENOUS | Status: DC
  Filled 2017-01-03: qty 100

## 2017-01-03 MED ORDER — AZTREONAM 2 G IJ SOLR
2.0000 g | Freq: Once | INTRAMUSCULAR | Status: DC
  Filled 2017-01-03: qty 2

## 2017-01-03 NOTE — H&P (Signed)
Office Visit Report     12/24/2016    [name]         MRN: [id number]  PRIMARY CARE:  Alex Cha, MD  DOB: [birth year], 71 year old Male  REFERRING:  Alex Cha, MD  SSN: [id number]  PROVIDER:  Festus Aloe, M.D.    LOCATION:  [facility] [phone]    CC: I have blood in my urine.  HPI: [name] is a 71 year-old male established patient who is here for blood in the urine.  He did see the blood in his urine. He first noticed the blood approximately 09/13/2016. He has not seen blood clots.   He does not have a burning sensation when he urinates. He is not currently having trouble urinating.   He is not having pain.   His last U/S or CT Scan was 09/20/2016.   March 2018 urinalysis with greater than 20 red blood cells per high-powered field 2. Pt noticed 4 days of dark urine. No clots. No pain or dysuria. U/S revealed a large prostate, median lobe, bladder tic. No hydro, normal kidneys.   No smoking. 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 He also has sarcoidosis.   He has some frequency and urgency and been told he has an "enlarged prostate". PSA was low at 1.14. DRE nl. CT showed possbile pancreas mass and MRI confirmed it. CT also showed a small right and a small left bladder diverticulum, but the left diverticulum had a little bit of a thicker wall. I referred back to [facility]. UA with > 60 rbc today. He saw Dr [name] and has an EUS Thursday. He noticed "burgundy" urine over the weekend.    ALLERGIES: Penicillin   MEDICATIONS: Latanoprost 0.005 % drops  Timolol Maleate    GU PSH: Locm 300-399Mg /Ml Iodine,1Ml - 11/22/2016   NON-GU PSH: Inguinal Hernia Repair > 5 yrs   GU PMH: BPH w/LUTS - 11/13/2016 Microscopic hematuria - 11/13/2016 Urinary Frequency - 11/13/2016   NON-GU PMH: Glaucoma Hepatitis C Skin Cancer, History   FAMILY HISTORY: 1 Daughter - Daughter 2 sons - Son Death In The Family Father - Father Death In  The Family Mother - Mother   SOCIAL HISTORY: Marital Status: Single Current Smoking Status: Patient has never smoked.   Tobacco Use Assessment Completed:  Used Tobacco in last 30 days?  Has never drank.  Does not drink caffeine.   REVIEW OF SYSTEMS:    GU Review Male:   Patient denies frequent urination, hard to postpone urination, burning/ pain with urination, get up at night to urinate, leakage of urine, stream starts and stops, trouble starting your stream, have to strain to urinate , erection problems, and penile pain.  Gastrointestinal (Upper):   Patient denies nausea, vomiting, and indigestion/ heartburn.  Gastrointestinal (Lower):   Patient denies diarrhea and constipation.  Constitutional:   Patient reports weight loss. Patient denies fever, night sweats, and fatigue.  Skin:   Patient reports skin rash/ lesion and itching.   Eyes:   Patient reports blurred vision. Patient denies double vision.  Ears/ Nose/ Throat:   Patient denies sore throat and sinus problems.  Hematologic/Lymphatic:   Patient denies swollen glands and easy bruising.  Cardiovascular:   Patient denies leg swelling and chest pains.  Respiratory:   Patient reports shortness of breath. Patient denies cough.  Endocrine:   Patient denies excessive thirst.  Musculoskeletal:   Patient denies back pain and joint pain.  Neurological:  Patient denies headaches and dizziness.  Psychologic:   Patient denies depression and anxiety.   VITAL SIGNS:      12/24/2016 03:27 PM  Weight 177 lb / 80.29 kg  Height 71 in / 180.34 cm  BP 124/76 mmHg  Pulse 55 /min  Temperature 98.0 F / 37 C  BMI 24.7 kg/m   MULTI-SYSTEM PHYSICAL EXAMINATION:    Constitutional: Well-nourished. No physical deformities. Normally developed. Good grooming.  Neck: Neck symmetrical, not swollen. Normal tracheal position.  Respiratory: No labored breathing, no use of accessory muscles.   Cardiovascular: Normal temperature, normal extremity pulses,  no swelling, no varicosities.  Skin: No paleness, no jaundice, no cyanosis. No lesion, no ulcer, no rash.  Neurologic / Psychiatric: Oriented to time, oriented to place, oriented to person. No depression, no anxiety, no agitation.    PAST DATA REVIEWED:  Source Of History:  Patient   11/13/16  PSA  Total PSA 1.14    PROCEDURES:         Flexible Cystoscopy - 52000  Risks, benefits, and some of the potential complications of the procedure were discussed with the patient. All questions were answered. Informed consent was obtained. Antibiotic prophylaxis was given -- Cipro. Sterile technique and intraurethral analgesia were used.  Meatus:  Normal size. Normal location. Normal condition.  Urethra:  No strictures.  External Sphincter:  Normal.  Verumontanum:  Normal.  Prostate:  Non-obstructing. No hyperplasia.  Bladder Neck:  Non-obstructing.  Ureteral Orifices:  Normal location. Normal size. Normal shape. Effluxed clear urine.  Bladder:  No trabeculation. No tumors. Normal mucosa. No stones.      The lower urinary tract was carefully examined. The procedure was well-tolerated and without complications. Antibiotic instructions were given. Instructions were given to call the office immediately for bloody urine, difficulty urinating, painful urination, fever, chills, nausea, vomiting or other illness. The patient stated that he understood these instructions and would comply with them.        Urinalysis w/Scope Dipstick Dipstick Cont'd Micro  Color: Yellow Bilirubin: Neg WBC/hpf: 0 - 5/hpf  Appearance: Cloudy Ketones: Neg RBC/hpf: >60/hpf  Specific Gravity: 1.025 Blood: 3+ Bacteria: Few (10-25/hpf)  pH: 6.0 Protein: 1+ Cystals: NS (Not Seen)  Glucose: Neg Urobilinogen: 0.2 Casts: NS (Not Seen)    Nitrites: Neg Trichomonas: Not Present    Leukocyte Esterase: Neg Mucous: Not Present      Epithelial Cells: NS (Not Seen)      Yeast: NS (Not Seen)      Sperm: Not Present   ASSESSMENT:       ICD-10 Details  1 GU:   Microscopic hematuria - R31.21   2   Bladder disorder, Unspec - N32.9   3   Bladder Diverticulum - N32.3    PLAN:          Orders Labs Urine Cytology         Schedule Return Visit/Planned Activity: Next Available Appointment - Schedule Surgery         Document Letter(s):  Created for Patient: Clinical Summary        Notes:   Erythematous areas in bladder diverticulum-discussed with the patient the nature of this benefits of surveillance versus bladder biopsy and fulguration. The diverticulum were very difficult to angle the scope to get into the diverticulum was large prostate. He elected to proceed with bladder biopsy. I did send urine cytology today.   cc: Dr. Fara Olden   * Signed by Festus Aloe, M.D. on 12/25/16 at 9:34 PM (  EDT)**     The information contained in this medical record document is considered private and confidential patient information. This information can only be used for the medical diagnosis and/or medical services that are being provided by the patient's selected caregivers. This information can only be distributed outside of the patient's care if the patient agrees and signs waivers of authorization for this information to be sent to an outside source or route.  Add: Cytology was negative

## 2017-01-04 ENCOUNTER — Ambulatory Visit (HOSPITAL_BASED_OUTPATIENT_CLINIC_OR_DEPARTMENT_OTHER): Payer: Medicare HMO | Admitting: Anesthesiology

## 2017-01-04 ENCOUNTER — Encounter (HOSPITAL_BASED_OUTPATIENT_CLINIC_OR_DEPARTMENT_OTHER): Payer: Self-pay | Admitting: Anesthesiology

## 2017-01-04 ENCOUNTER — Encounter (HOSPITAL_BASED_OUTPATIENT_CLINIC_OR_DEPARTMENT_OTHER)
Admission: RE | Disposition: A | Discharge status: Home / Self Care | Payer: Self-pay | Source: Ambulatory Visit | Attending: Urology

## 2017-01-04 ENCOUNTER — Ambulatory Visit (HOSPITAL_BASED_OUTPATIENT_CLINIC_OR_DEPARTMENT_OTHER)
Admission: RE | Admit: 2017-01-04 | Discharge: 2017-01-04 | Disposition: A | Discharge status: Home / Self Care | Payer: Medicare HMO | Source: Ambulatory Visit | Attending: Urology | Admitting: Urology

## 2017-01-04 DIAGNOSIS — D494 Neoplasm of unspecified behavior of bladder: Secondary | ICD-10-CM | POA: Diagnosis not present

## 2017-01-04 DIAGNOSIS — R350 Frequency of micturition: Secondary | ICD-10-CM | POA: Diagnosis not present

## 2017-01-04 DIAGNOSIS — R310 Gross hematuria: Secondary | ICD-10-CM | POA: Insufficient documentation

## 2017-01-04 DIAGNOSIS — N3289 Other specified disorders of bladder: Secondary | ICD-10-CM | POA: Insufficient documentation

## 2017-01-04 DIAGNOSIS — R3915 Urgency of urination: Secondary | ICD-10-CM | POA: Diagnosis not present

## 2017-01-04 DIAGNOSIS — N323 Diverticulum of bladder: Secondary | ICD-10-CM | POA: Insufficient documentation

## 2017-01-04 DIAGNOSIS — N401 Enlarged prostate with lower urinary tract symptoms: Secondary | ICD-10-CM | POA: Diagnosis not present

## 2017-01-04 DIAGNOSIS — N400 Enlarged prostate without lower urinary tract symptoms: Secondary | ICD-10-CM | POA: Diagnosis not present

## 2017-01-04 HISTORY — DX: Diverticulum of bladder: N32.3

## 2017-01-04 HISTORY — DX: Sarcoidosis of lung: D86.0

## 2017-01-04 HISTORY — PX: CYSTOSCOPY WITH BIOPSY: SHX5122

## 2017-01-04 HISTORY — DX: Personal history of other diseases of the digestive system: Z87.19

## 2017-01-04 HISTORY — DX: Personal history of other infectious and parasitic diseases: Z86.19

## 2017-01-04 HISTORY — DX: Nonspecific reaction to cell mediated immunity measurement of gamma interferon antigen response without active tuberculosis: R76.12

## 2017-01-04 HISTORY — DX: Personal history of malignant neoplasm, unspecified: Z98.890

## 2017-01-04 HISTORY — DX: Other specified diseases of pancreas: K86.89

## 2017-01-04 HISTORY — DX: Other specified postprocedural states: Z98.890

## 2017-01-04 HISTORY — DX: Other specified postprocedural states: Z85.9

## 2017-01-04 LAB — POCT HEMOGLOBIN-HEMACUE: HEMOGLOBIN: 14.2 g/dL (ref 13.0–17.0)

## 2017-01-04 SURGERY — CYSTOSCOPY, WITH BIOPSY
Anesthesia: General

## 2017-01-04 MED ORDER — ONDANSETRON HCL 4 MG/2ML IJ SOLN
INTRAMUSCULAR | Status: DC | PRN
  Administered 2017-01-04: 4 mg via INTRAVENOUS

## 2017-01-04 MED ORDER — LIDOCAINE 2% (20 MG/ML) 5 ML SYRINGE
INTRAMUSCULAR | Status: DC | PRN
  Administered 2017-01-04: 40 mg via INTRAVENOUS

## 2017-01-04 MED ORDER — CEFAZOLIN SODIUM-DEXTROSE 2-4 GM/100ML-% IV SOLN
2.0000 g | Freq: Once | INTRAVENOUS | Status: AC
  Administered 2017-01-04: 2 g via INTRAVENOUS
  Filled 2017-01-04: qty 100

## 2017-01-04 MED ORDER — CEFAZOLIN SODIUM-DEXTROSE 2-4 GM/100ML-% IV SOLN
INTRAVENOUS | Status: AC
  Filled 2017-01-04: qty 100

## 2017-01-04 MED ORDER — MIDAZOLAM HCL 2 MG/2ML IJ SOLN
INTRAMUSCULAR | Status: AC
  Filled 2017-01-04: qty 2

## 2017-01-04 MED ORDER — PROPOFOL 10 MG/ML IV BOLUS
INTRAVENOUS | Status: DC | PRN
Start: 2017-01-04 — End: 2017-01-04
  Administered 2017-01-04: 150 mg via INTRAVENOUS
  Administered 2017-01-04: 50 mg via INTRAVENOUS

## 2017-01-04 MED ORDER — DEXAMETHASONE SODIUM PHOSPHATE 4 MG/ML IJ SOLN
INTRAMUSCULAR | Status: DC | PRN
  Administered 2017-01-04: 10 mg via INTRAVENOUS

## 2017-01-04 MED ORDER — PROMETHAZINE HCL 25 MG/ML IJ SOLN
6.2500 mg | INTRAMUSCULAR | Status: DC | PRN
  Filled 2017-01-04: qty 1

## 2017-01-04 MED ORDER — MIDAZOLAM HCL 2 MG/2ML IJ SOLN
0.5000 mg | Freq: Once | INTRAMUSCULAR | Status: DC | PRN
  Filled 2017-01-04: qty 2

## 2017-01-04 MED ORDER — DEXAMETHASONE SODIUM PHOSPHATE 10 MG/ML IJ SOLN
INTRAMUSCULAR | Status: AC
  Filled 2017-01-04: qty 1

## 2017-01-04 MED ORDER — MEPERIDINE HCL 25 MG/ML IJ SOLN
6.2500 mg | INTRAMUSCULAR | Status: DC | PRN
  Filled 2017-01-04: qty 1

## 2017-01-04 MED ORDER — NITROFURANTOIN MONOHYD MACRO 100 MG PO CAPS: 100.0000 mg | ORAL_CAPSULE | Freq: Every day | ORAL | 0 refills | Status: DC

## 2017-01-04 MED ORDER — FENTANYL CITRATE (PF) 100 MCG/2ML IJ SOLN
25.0000 ug | INTRAMUSCULAR | Status: DC | PRN
  Filled 2017-01-04: qty 1

## 2017-01-04 MED ORDER — PROPOFOL 10 MG/ML IV BOLUS
INTRAVENOUS | Status: AC
  Filled 2017-01-04: qty 20

## 2017-01-04 MED ORDER — ONDANSETRON HCL 4 MG/2ML IJ SOLN
INTRAMUSCULAR | Status: AC
  Filled 2017-01-04: qty 2

## 2017-01-04 MED ORDER — LIDOCAINE 2% (20 MG/ML) 5 ML SYRINGE
INTRAMUSCULAR | Status: AC
  Filled 2017-01-04: qty 5

## 2017-01-04 MED ORDER — FENTANYL CITRATE (PF) 100 MCG/2ML IJ SOLN
INTRAMUSCULAR | Status: AC
  Filled 2017-01-04: qty 2

## 2017-01-04 MED ORDER — LACTATED RINGERS IV SOLN
INTRAVENOUS | Status: DC
  Administered 2017-01-04 (×2): via INTRAVENOUS
  Filled 2017-01-04: qty 1000

## 2017-01-04 MED ORDER — KETOROLAC TROMETHAMINE 30 MG/ML IJ SOLN
INTRAMUSCULAR | Status: DC | PRN
  Administered 2017-01-04: 30 mg via INTRAVENOUS

## 2017-01-04 MED ORDER — FENTANYL CITRATE (PF) 100 MCG/2ML IJ SOLN
INTRAMUSCULAR | Status: DC | PRN
  Administered 2017-01-04: 50 ug via INTRAVENOUS

## 2017-01-04 SURGICAL SUPPLY — 22 items
BAG DRAIN URO-CYSTO SKYTR STRL (DRAIN) ×2 IMPLANT
BAG DRN ANRFLXCHMBR STRAP LEK (BAG)
BAG DRN UROCATH (DRAIN) ×1
BAG URINE DRAINAGE (UROLOGICAL SUPPLIES) ×2 IMPLANT
BAG URINE LEG 19OZ MD ST LTX (BAG) IMPLANT
BAG URINE LEG 500ML (DRAIN) IMPLANT
CATH COUDE FOLEY 2W 5CC 18FR (CATHETERS) ×2 IMPLANT
CATH FOLEY 2WAY SLVR  5CC 16FR (CATHETERS)
CATH FOLEY 2WAY SLVR 5CC 16FR (CATHETERS) IMPLANT
CLOTH BEACON ORANGE TIMEOUT ST (SAFETY) ×2 IMPLANT
ELECT REM PT RETURN 9FT ADLT (ELECTROSURGICAL) ×2
ELECTRODE REM PT RTRN 9FT ADLT (ELECTROSURGICAL) ×1 IMPLANT
GLOVE BIO SURGEON STRL SZ7.5 (GLOVE) ×2 IMPLANT
GOWN STRL REUS W/ TWL XL LVL3 (GOWN DISPOSABLE) ×1 IMPLANT
GOWN STRL REUS W/TWL XL LVL3 (GOWN DISPOSABLE) ×1
KIT RM TURNOVER CYSTO AR (KITS) ×2 IMPLANT
MANIFOLD NEPTUNE II (INSTRUMENTS) IMPLANT
NEEDLE HYPO 22GX1.5 SAFETY (NEEDLE) IMPLANT
NS IRRIG 500ML POUR BTL (IV SOLUTION) IMPLANT
PACK CYSTO (CUSTOM PROCEDURE TRAY) ×2 IMPLANT
TUBE CONNECTING 12X1/4 (SUCTIONS) IMPLANT
WATER STERILE IRR 3000ML UROMA (IV SOLUTION) ×4 IMPLANT

## 2017-01-04 NOTE — Transfer of Care (Signed)
Immediate Anesthesia Transfer of Care Note  Patient: [name]  Procedure(s) Performed: Procedure(s): CYSTOSCOPY WITH BIOPSY AND FULGURATION (N/A)  Patient Location: PACU  Anesthesia Type:General  Level of Consciousness: sedated and patient cooperative  Airway & Oxygen Therapy: Patient Spontanous Breathing and Patient connected to face mask oxygen  Post-op Assessment: Report given to RN and Post -op Vital signs reviewed and stable  Post vital signs: Reviewed and stable  Last Vitals:  Vitals:   01/04/17 0738  BP: 130/81  Pulse: 69  Resp: 18  Temp: 36.8 C    Last Pain:  Vitals:   01/04/17 0738  TempSrc: Oral      Patients Stated Pain Goal: 6 (40/76/80 8811)  Complications: No apparent anesthesia complications

## 2017-01-04 NOTE — Anesthesia Preprocedure Evaluation (Addendum)
Anesthesia Evaluation  Patient identified by MRN, date of birth, ID band Patient awake    Reviewed: Allergy & Precautions, NPO status , Patient's Chart, lab work & pertinent test results  History of Anesthesia Complications Negative for: history of anesthetic complications  Airway Mallampati: II  TM Distance: >3 FB Neck ROM: Full    Dental  (+) Loose, Poor Dentition, Missing, Dental Advisory Given, Chipped,  Extremely loose upper incisors:   Pulmonary  sarcoidosis   breath sounds clear to auscultation       Cardiovascular (-) anginanegative cardio ROS   Rhythm:Regular Rate:Normal     Neuro/Psych negative neurological ROS     GI/Hepatic neg GERD  ,(+) Hepatitis -, CPancreatic mass   Endo/Other  negative endocrine ROS  Renal/GU negative Renal ROS   Bladder diverticulum    Musculoskeletal   Abdominal   Peds  Hematology negative hematology ROS (+)   Anesthesia Other Findings   Reproductive/Obstetrics                            Anesthesia Physical Anesthesia Plan  ASA: II  Anesthesia Plan: General   Post-op Pain Management:    Induction: Intravenous  PONV Risk Score and Plan: 2 and Ondansetron and Dexamethasone  Airway Management Planned: LMA  Additional Equipment:   Intra-op Plan:   Post-operative Plan:   Informed Consent: I have reviewed the patients History and Physical, chart, labs and discussed the procedure including the risks, benefits and alternatives for the proposed anesthesia with the patient or authorized representative who has indicated his/her understanding and acceptance.   Dental advisory given  Plan Discussed with: CRNA and Surgeon  Anesthesia Plan Comments: (Plan routine monitors, GA- LMA OK)        Anesthesia Quick Evaluation

## 2017-01-04 NOTE — Interval H&P Note (Signed)
History and Physical Interval Note:  01/04/2017 8:43 AM  [name]  has presented today for surgery, with the diagnosis of bladder diverticulum  The various methods of treatment have been discussed with the patient and family. After consideration of risks, benefits and other options for treatment, the patient has consented to  Procedure(s): CYSTOSCOPY WITH BIOPSY AND FULGURATION (N/A) as a surgical intervention .  The patient's history has been reviewed, patient examined, no change in status, stable for surgery.  I have reviewed the patient's chart and labs.  Questions were answered to the patient's satisfaction.  He noticed blood in urine over the weekend. No fever or dysuria.    Trenita Hulme

## 2017-01-04 NOTE — Op Note (Addendum)
Preoperative diagnosis: Gross hematuria, bladder diverticulum, bladder erythema, BPH Postoperative diagnosis: Same  Procedure: Cystoscopy with bladder biopsy and fulguration, destruction of erythematous lesion of 15 mm  Surgeon: Junious Silk  Anesthesia: Gen.  Indication for procedure: 71 year old with gross hematuria and erythematous mucosa in a small left bladder diverticulum.  Findings: Per anesthesia 3 of his front teeth are loose. On exam under anesthesia the prostate was about 40 g, smooth, the SV's were palpable. May be some subtle induration of the right or R>L lobe hypertrophy. All landmarks preserved. Penis was circumcised without mass or lesion. Testicles descended bilaterally and palpably normal.  On cystoscopy the urethra was unremarkable, prostatic urethra had some mild lateral lobe hypertrophy with a large obstructing median lobe. Bladder with mild trabeculation, no stone or foreign body. The urine was brown. Trigone and ureteral orifices cc only seen with the 70 lens but clear reflux bilaterally. Right bladder diverticulum benign. Left bladder diverticulum with an erythematous angry lesion along the posterior wall with old blood in the bottom of the diverticulum. Some more cystic or cystitis appearing changes on the left bladder wall which were biopsied.  Description of procedure: After consent was obtained the patient was brought to the operating room. After adequate anesthesia he was placed in lithotomy position and prepped and draped in the usual sterile fashion. A timeout was performed to confirm the patient and procedure. An exam under anesthesia was performed. The cystoscope was passed per urethra. The bladder was irrigated to clear and urine was sent for culture. The bladder was carefully examined with the 30 and 70 lens. The old blood in the diverticulum was flushed out. I then used a 9 [place] flexible biopsy forceps to biopsy the left bladder diverticulum and then completely  destroyed this erythematous lesion. I then biopsied the left bladder and fulgurated these areas. Hemostasis was excellent. In the event he is not completely emptying his bladder ongoing to leave a catheter over the weekend with a nightly antibiotic. I'll have him remove the catheter or come to the office Monday to remove it.  Complications: None  Blood loss: Minimal  Specimens to lab/pathology: #1 urine culture #2 left diverticulum biopsy #3 left bladder biopsy inferior #4 left bilateral biopsy superior  Drains: 18 French coud catheter  Disposition: Patient stable to PACU

## 2017-01-04 NOTE — Anesthesia Postprocedure Evaluation (Signed)
Anesthesia Post Note  Patient: [name]  Procedure(s) Performed: Procedure(s) (LRB): CYSTOSCOPY WITH BIOPSY AND FULGURATION (N/A)     Patient location during evaluation: PACU Anesthesia Type: General Level of consciousness: awake and alert, oriented and patient cooperative Pain management: pain level controlled Vital Signs Assessment: post-procedure vital signs reviewed and stable Respiratory status: spontaneous breathing, nonlabored ventilation and respiratory function stable Cardiovascular status: blood pressure returned to baseline and stable Postop Assessment: no signs of nausea or vomiting Anesthetic complications: no    Last Vitals:  Vitals:   01/04/17 1045 01/04/17 1100  BP: 129/90 128/89  Pulse: (!) 53 (!) 52  Resp: 12 (!) 9  Temp:      Last Pain:  Vitals:   01/04/17 1004  TempSrc:   PainSc: Asleep                 Jawanda Passey,E. Phyllis Abelson

## 2017-01-04 NOTE — Discharge Instructions (Signed)
Indwelling Urinary Catheter Care, Adult Take good care of your catheter to keep it working and to prevent problems.  REMOVAL OF THE CATHETER: remove the catheter Monday morning, January 07, 2017 as instructed or come by the office for removal.   How to wear your catheter Attach your catheter to your leg with tape (adhesive tape) or a leg strap. Make sure it is not too tight. If you use tape, remove any bits of tape that are already on the catheter. How to wear a drainage bag You should have:  A large overnight bag.  A small leg bag.  Overnight Bag You may wear the overnight bag at any time. Always keep the bag below the level of your bladder but off the floor. When you sleep, put a clean plastic bag in a wastebasket. Then hang the bag inside the wastebasket. Leg Bag Never wear the leg bag at night. Always wear the leg bag below your knee. Keep the leg bag secure with a leg strap or tape. How to care for your skin  Clean the skin around the catheter at least once every day.  Shower every day. Do not take baths.  Put creams, lotions, or ointments on your genital area only as told by your doctor.  Do not use powders, sprays, or lotions on your genital area. How to clean your catheter and your skin 1. Wash your hands with soap and water. 2. Wet a washcloth in warm water and gentle (mild) soap. 3. Use the washcloth to clean the skin where the catheter enters your body. Clean downward and wipe away from the catheter in small circles. Do not wipe toward the catheter. 4. Pat the area dry with a clean towel. Make sure to clean off all soap. How to care for your drainage bags Empty your drainage bag when it is ?- full or at least 2-3 times a day. Replace your drainage bag once a month or sooner if it starts to smell bad or look dirty. Do not clean your drainage bag unless told by your doctor. Emptying a drainage bag  Supplies Needed  Rubbing alcohol.  Gauze pad or cotton ball.  Tape or  a leg strap.  Steps 1. Wash your hands with soap and water. 2. Separate (detach) the bag from your leg. 3. Hold the bag over the toilet or a clean container. Keep the bag below your hips and bladder. This stops pee (urine) from going back into the tube. 4. Open the pour spout at the bottom of the bag. 5. Empty the pee into the toilet or container. Do not let the pour spout touch any surface. 6. Put rubbing alcohol on a gauze pad or cotton ball. 7. Use the gauze pad or cotton ball to clean the pour spout. 8. Close the pour spout. 9. Attach the bag to your leg with tape or a leg strap. 10. Wash your hands.  Changing a drainage bag Supplies Needed  Alcohol wipes.  A clean drainage bag.  Adhesive tape or a leg strap.  Steps 1. Wash your hands with soap and water. 2. Separate the dirty bag from your leg. 3. Pinch the rubber catheter with your fingers so that pee does not spill out. 4. Separate the catheter tube from the drainage tube where these tubes connect (at the connection valve). Do not let the tubes touch any surface. 5. Clean the end of the catheter tube with an alcohol wipe. Use a different alcohol wipe to clean the  end of the drainage tube. 6. Connect the catheter tube to the drainage tube of the clean bag. 7. Attach the new bag to the leg with adhesive tape or a leg strap. 8. Wash your hands.  How to prevent infection and other problems  Never pull on your catheter or try to remove it. Pulling can damage tissue in your body.  Always wash your hands before and after touching your catheter.  If a leg strap gets wet, replace it with a dry one.  Drink enough fluids to keep your pee clear or pale yellow, or as told by your doctor.  Do not let the drainage bag or tubing touch the floor.  Wear cotton underwear.  If you are male, wipe from front to back after you poop (have a bowel movement).  Check on the catheter often to make sure it works and the tubing is not  twisted. Get help if:  Your pee is cloudy.  Your pee smells unusually bad.  Your pee is not draining into the bag.  Your tube gets clogged.  Your catheter starts to leak.  Your bladder feels full. Get help right away if:  You have redness, swelling, or pain where the catheter enters your body.  You have fluid, pus, or a bad smell coming from the area where the catheter enters your body.  The area where the catheter enters your body feels warm.  You have a fever.  You have pain in your: ? Stomach (abdomen). ? Legs. ? Lower back. ? Bladder.  You see blood clots fill the catheter.  Your pee is pink or red.  You feel sick to your stomach (nauseous).  You throw up (vomit).  You have chills.  Your catheter gets pulled out. This information is not intended to replace advice given to you by your health care provider. Make sure you discuss any questions you have with your health care provider. Document Released: 10/27/2012 Document Revised: 05/30/2016 Document Reviewed: 12/15/2013 Elsevier Interactive Patient Education  2018 [place] [facility] Instructions  Activity: Get plenty of rest for the remainder of the day. A responsible individual must stay with you for 24 hours following the procedure.  For the next 24 hours, DO NOT: -Drive a car -Paediatric nurse -Drink alcoholic beverages -Take any medication unless instructed by your physician -Make any legal decisions or sign important papers.  Meals: Start with liquid foods such as gelatin or soup. Progress to regular foods as tolerated. Avoid greasy, spicy, heavy foods. If nausea and/or vomiting occur, drink only clear liquids until the nausea and/or vomiting subsides. Call your physician if vomiting continues.  Special Instructions/Symptoms: Your throat may feel dry or sore from the anesthesia or the breathing tube placed in your throat during surgery. If this causes discomfort, gargle with  warm salt water. The discomfort should disappear within 24 hours.  If you had a scopolamine patch placed behind your ear for the management of post- operative nausea and/or vomiting:  1. The medication in the patch is effective for 72 hours, after which it should be removed.  Wrap patch in a tissue and discard in the trash. Wash hands thoroughly with soap and water. 2. You may remove the patch earlier than 72 hours if you experience unpleasant side effects which may include dry mouth, dizziness or visual disturbances. 3. Avoid touching the patch. Wash your hands with soap and water after contact with the patch.

## 2017-01-04 NOTE — Interval H&P Note (Signed)
History and Physical Interval Note:  01/04/2017 8:45 AM  Discussed he may need a foley over weekend.     [name]

## 2017-01-04 NOTE — Anesthesia Procedure Notes (Signed)
Procedure Name: LMA Insertion Date/Time: 01/04/2017 9:17 AM Performed by: Justice Rocher Pre-anesthesia Checklist: Patient identified, Emergency Drugs available, Suction available and Patient being monitored Patient Re-evaluated:Patient Re-evaluated prior to inductionOxygen Delivery Method: Circle system utilized Preoxygenation: Pre-oxygenation with 100% oxygen Intubation Type: IV induction Ventilation: Mask ventilation without difficulty LMA: LMA inserted LMA Size: 4.0 Number of attempts: 1 Airway Equipment and Method: Bite block Placement Confirmation: positive ETCO2 and breath sounds checked- equal and bilateral Tube secured with: Tape Dental Injury: Teeth and Oropharynx as per pre-operative assessment  Comments: All front teeth and several bottom teeth loose. Dental risks explained preop - pt understood. LMA placed without difficulty. BBS equal. Taped LMA to bottom lip, mouth gently packed with gauze as oral airway to protect teeth. Teeth as preop after LMA placed

## 2017-01-05 LAB — URINE CULTURE: CULTURE: NO GROWTH

## 2017-01-07 ENCOUNTER — Encounter (HOSPITAL_BASED_OUTPATIENT_CLINIC_OR_DEPARTMENT_OTHER): Payer: Self-pay | Admitting: Urology

## 2017-01-08 DIAGNOSIS — K869 Disease of pancreas, unspecified: Secondary | ICD-10-CM | POA: Diagnosis not present

## 2017-01-21 DIAGNOSIS — K869 Disease of pancreas, unspecified: Secondary | ICD-10-CM | POA: Diagnosis not present

## 2017-01-21 DIAGNOSIS — B1920 Unspecified viral hepatitis C without hepatic coma: Secondary | ICD-10-CM | POA: Diagnosis not present

## 2017-01-21 DIAGNOSIS — D378 Neoplasm of uncertain behavior of other specified digestive organs: Secondary | ICD-10-CM | POA: Diagnosis not present

## 2017-01-28 DIAGNOSIS — D378 Neoplasm of uncertain behavior of other specified digestive organs: Secondary | ICD-10-CM | POA: Diagnosis not present

## 2017-01-28 DIAGNOSIS — Z8709 Personal history of other diseases of the respiratory system: Secondary | ICD-10-CM | POA: Diagnosis not present

## 2017-01-28 DIAGNOSIS — K869 Disease of pancreas, unspecified: Secondary | ICD-10-CM | POA: Diagnosis not present

## 2017-01-30 DIAGNOSIS — R590 Localized enlarged lymph nodes: Secondary | ICD-10-CM | POA: Diagnosis not present

## 2017-01-30 DIAGNOSIS — R918 Other nonspecific abnormal finding of lung field: Secondary | ICD-10-CM | POA: Diagnosis not present

## 2017-01-30 DIAGNOSIS — D869 Sarcoidosis, unspecified: Secondary | ICD-10-CM | POA: Diagnosis not present

## 2017-01-30 DIAGNOSIS — R161 Splenomegaly, not elsewhere classified: Secondary | ICD-10-CM | POA: Diagnosis not present

## 2017-01-30 DIAGNOSIS — K8689 Other specified diseases of pancreas: Secondary | ICD-10-CM | POA: Diagnosis not present

## 2017-01-30 DIAGNOSIS — N323 Diverticulum of bladder: Secondary | ICD-10-CM | POA: Diagnosis not present

## 2017-01-30 DIAGNOSIS — K869 Disease of pancreas, unspecified: Secondary | ICD-10-CM | POA: Diagnosis not present

## 2017-02-07 ENCOUNTER — Telehealth: Payer: Self-pay | Admitting: Internal Medicine

## 2017-02-07 DIAGNOSIS — K869 Disease of pancreas, unspecified: Secondary | ICD-10-CM | POA: Diagnosis not present

## 2017-02-07 DIAGNOSIS — Z880 Allergy status to penicillin: Secondary | ICD-10-CM | POA: Diagnosis not present

## 2017-02-07 NOTE — Telephone Encounter (Signed)
Phone call 02/07/17 0930 from Oncologist Dr Feliberto Harts (sp) at [facility].  Patient had hematuria. 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 [place] needle bx attempted x 3 keeps showing "atypical glandular cells".  CT chest looks "bad" but don't know if it is still just his sarcoid, or he could also have adenoCA in lung.  Question to [place]- Would chemoRx for adenoCA make his sarcoid worse?- My guess= probably not.  The request that we call patient in next week to see Dr Kimber Relic, or someone else. Patient is being told to bring CT disk to compare images of chest CT.

## 2017-02-07 NOTE — Telephone Encounter (Signed)
Called spoke with patient who requested that I speak with his sister [name] (dpr not on file for [name] but no clinical information was disclosed during this phone call) >> will need updated dpr to include [name] as she reported to me that she will be helping patient will his healthcare  Discussed with [name] that we received a phone call from Dr. Shary Decamp (spelling verified in [place]) who recommends patient has follow up appt with PM.  PM has an opening on 7.30.18 @ 0945.  [name] okay with this date and time.  She and patient were at [facility] now to pick up disc and will ensure they bring this to the appt on Monday.  Nothing further needed at this time Will sign and forward to PM to make him aware

## 2017-02-11 ENCOUNTER — Encounter: Payer: Self-pay | Admitting: Pulmonary Disease

## 2017-02-11 ENCOUNTER — Ambulatory Visit (INDEPENDENT_AMBULATORY_CARE_PROVIDER_SITE_OTHER): Payer: Medicare HMO | Admitting: Pulmonary Disease

## 2017-02-11 VITALS — BP 120/68 | HR 69 | Ht 71.0 in | Wt 176.6 lb

## 2017-02-11 DIAGNOSIS — K861 Other chronic pancreatitis: Secondary | ICD-10-CM | POA: Diagnosis not present

## 2017-02-11 DIAGNOSIS — C251 Malignant neoplasm of body of pancreas: Secondary | ICD-10-CM | POA: Diagnosis not present

## 2017-02-11 DIAGNOSIS — K869 Disease of pancreas, unspecified: Secondary | ICD-10-CM | POA: Diagnosis not present

## 2017-02-11 DIAGNOSIS — D860 Sarcoidosis of lung: Secondary | ICD-10-CM | POA: Diagnosis not present

## 2017-02-11 DIAGNOSIS — K8689 Other specified diseases of pancreas: Secondary | ICD-10-CM | POA: Diagnosis not present

## 2017-02-11 DIAGNOSIS — D869 Sarcoidosis, unspecified: Secondary | ICD-10-CM | POA: Diagnosis not present

## 2017-02-11 NOTE — Progress Notes (Addendum)
[name]    [id number]    [birth year]  Primary Care Physician:Varadarajan, Ronie Spies, MD  Referring Physician: Leeroy Cha, MD [address] [place], [place] [address]  Chief complaint:  Follow up for sarcoidosis  HPI:  71 year old with past medical history of biopsy-proven sarcoidosis. The diagnosis was made by transbronchial biopsies in 2015. He also has a persistent endobronchial abnormality in the posterior basilar segment of the right lower lobe. Endobronchial biopsies did not reveal any malignancy. He had been maintained on chronic prednisone for some time. He was eventually tapered off the prednisone and started on an ICS/LABA combination. He stopped taking this too a year ago and had remained asymptomatic.  He was restarted on the Breo in 2017 after an episode of acute bronchitis with wheezing. He is doing well on with no new complaints. He has seen an ophthalmologist who told him that the sarcoidosis is not affecting his eyes. He is a nonsmoker. 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 An ultrasound-guided needle biopsy was attempted 3 times but the path shows atypical glandular cells. He had a CT of the chest abdomen pelvis this month which shows redemonstration of pulmonary nodules, adenopathy.  He is due to get a repeat biopsy of pancreas tomorrow. He has been sent here by his oncologist Dr.Shorsher at [facility] who wants him revaluated for the following questions.   1. CT chest looks "bad" but don't know if it is still just his sarcoid, or he could also have adenoCA in lung. 2. Would chemoRx for adenoCA make his sarcoid worse?  Outpatient Encounter Prescriptions as of 02/11/2017  Medication Sig  . COMBIGAN 0.2-0.5 % ophthalmic solution Place 1 drop into both eyes 2 (two) times daily. As directed  . fluticasone  furoate-vilanterol (BREO ELLIPTA) 100-25 MCG/INH AEPB Inhale 1 puff into the lungs daily.  Marland Kitchen latanoprost (XALATAN) 0.005 % ophthalmic solution Place 1 drop into both eyes at bedtime. As directed  . nitrofurantoin, macrocrystal-monohydrate, (MACROBID) 100 MG capsule Take 1 capsule (100 mg total) by mouth at bedtime.   No facility-administered encounter medications on file as of 02/11/2017.     Allergies as of 02/11/2017 - Review Complete 01/04/2017  Allergen Reaction Noted  . Penicillins Other (See Comments) 08/24/2011    Past Medical History:  Diagnosis Date  . 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 Bladder diverticulum 12/17/2016  . History of GI bleed    upper GI bleed 07-20-2001  per EGD bleeding coming from pyloic channel/  09-02-2010 upper GI bleed -- per EGD erosive mucosa esophagastric region   . History of positive hepatitis C dx 06-03-2014   Genotype 1A (F0 to F1)  completed harvoni treatment 03/ 2016 and nondetectalbe  . History of squamous cell carcinoma excision    09-16-2007  re-excision lesion back / neck area  ,  low grade  . Pancreatic mass 12/12/2016   found on CT for hematuria work up  . Pulmonary sarcoidosis [facility]) pulmologist-  dr clance/ dr Arlana Pouch   dx age 64/  confirmed dx via endobronchial bx RLL 09-15-2013    Past Surgical History:  Procedure Laterality Date  . CYSTOSCOPY WITH BIOPSY N/A 01/04/2017   Procedure: CYSTOSCOPY WITH BIOPSY AND FULGURATION;  Surgeon: Festus Aloe, MD;  Location:  [facility];  Service: Urology;  Laterality: N/A;  . ESOPHAGOGASTRODUODENOSCOPY  last one 09-05-2010  . HERNIA REPAIR  1982  . RE-EXCISION LESION BACK/ NECK AREA  09/16/2007  . VIDEO BRONCHOSCOPY Bilateral 09/15/2013   Procedure: VIDEO BRONCHOSCOPY WITH FLUORO;  Surgeon: Kathee Delton, MD;  Location: [facility];  Service: Cardiopulmonary;  Laterality: Bilateral;  . VIDEO BRONCHOSCOPY Bilateral 12/16/2013     Procedure: VIDEO BRONCHOSCOPY WITHOUT FLUORO;  Surgeon: Kathee Delton, MD;  Location: [facility];  Service: Cardiopulmonary;  Laterality: Bilateral;    Family History  Problem Relation Age of Onset  . Hypertension Mother   . Heart failure Father   . Cancer - Prostate Brother     Social History   Social History  . Marital status: Single    Spouse name: N/A  . Number of children: N/A  . Years of education: N/A   Occupational History  . Not on file.   Social History Main Topics  . Smoking status: Never Smoker  . Smokeless tobacco: Never Used  . Alcohol use No  . Drug use: No  . Sexual activity: Not on file   Other Topics Concern  . Not on file   Social History Narrative  . No narrative on file    Review of systems: Review of Systems  Constitutional: Negative for fever and chills.  HENT: Negative.   Eyes: Negative for blurred vision.  Respiratory: as per HPI  Cardiovascular: Negative for chest pain and palpitations.  Gastrointestinal: Negative for vomiting, diarrhea, blood per rectum. Genitourinary: Negative for dysuria, urgency, frequency and hematuria.  Musculoskeletal: Negative for myalgias, back pain and joint pain.  Skin: Negative for itching and rash.  Neurological: Negative for dizziness, tremors, focal weakness, seizures and loss of consciousness.  Endo/Heme/Allergies: Negative for environmental allergies.  Psychiatric/Behavioral: Negative for depression, suicidal ideas and hallucinations.  All other systems reviewed and are negative.  Physical Exam: Blood pressure 120/68, pulse 69, height 5\' 11"  (1.803 m), weight 80.1 kg (176 lb 9.6 oz), SpO2 98 %. Gen:      No acute distress HEENT:  EOMI, sclera anicteric Neck:     No masses; no thyromegaly Lungs:    Clear to auscultation bilaterally; normal respiratory effort CV:         Regular rate and rhythm; no murmurs Abd:      + bowel sounds; soft, non-tender; no palpable masses, no distension Ext:    No  edema; adequate peripheral perfusion Skin:      Warm and dry; no rash Neuro: alert and oriented x 3 Psych: normal mood and affect  Data Reviewed: Endobronchial biopsy 12/16/13- No granulomas or malignancy Transbronchial biopsies 09/15/13- Chronic granulomatous disease  PFTs 10/20/13 FVC 3.43 [4%] FEV1 2.59 (83%) F/F 76 TLC 74% DLCO 62%. Mild restriction with mild reduction in diffusion capacity.  Imaging  CT chest 08/31/15- Diffuse reticulonodular opacities, thickening of bronchovascular bundles. Calcified mediastinal and hilar lymph nodes. CXR 03/09/16- Chronic interstital infiltrates   High res CT 03/29/16-diffuse interstitial lung disease. No change from 08/2015. All images reviewed.  CT chest/abd/pelvis from [facility] 01/30/17 Multiple pulmonary nodules, hilar, mediastinal lymphadenopathy. Right-sided lung nodules 4.6 centimeter lesion in the body of pancreas.  Labs 03/15/16 CMP, CBC - WNL ACE- 86  Assessment:  Follow-up for biopsy proven sarcoidosis. He is being worked up for a pancreatic mass concerning for cancer. There is also concern that his lung nodules seen on the latest scan from [facility] represent metastatic disease. I have reviewed  the CD of the scan that he brought from [facility] and compared to his prior imaging. On my review of the images, the lung nodules and lymphadenopathy appear chronic and stable. This probably represents just his stable sarcoid. I'll review the scan with the radiologist and then discuss with his oncologist at [facility]. We can consider getting a PET scan for further evaluation if his repeat biopsy tomorrow tests confirms cancer of the pancreas  If he does need chemotherapy for pancreatic carcinoma he should be able to tolerate it as the sarcoid has remained stable and he has not required any immunosuppression recently. He continues on the breo with stable symptoms.  Plan/Recommendations: - Surveyor, mining. - Review latest scan with radiology and  discuss getting a PET scan with the team at [facility]  Return in 3 months  Marshell Garfinkel MD [facility] [place] Pager [phone] 02/11/2017, 9:32 AM  CC: Leeroy Cha,*   Addendum: Reviewed CT scan with Dr. Polly Cobia, Radiologist who compared to uploaded recent scan from [facility] with prior imaging. He agrees that there is no change in lung findings compared to previous scans dating back to 2015. He agrees with my assessment that this looks like stable sarcoid. I have also discussed this with his oncologist at [facility]. There is no contraindication from our viewpoint to proceeding with chemotherapy.  Marshell Garfinkel MD [facility] Pager [phone] If no answer or after 3pm call: [phone] 02/19/2017, 10:42 AM

## 2017-02-11 NOTE — Patient Instructions (Signed)
I will review the CT scan with the radiologist and discuss with your oncologist at [facility]. I'll give you a call within a week to plan further tests if needed Return to clinic in 3 months.

## 2017-02-15 ENCOUNTER — Ambulatory Visit (INDEPENDENT_AMBULATORY_CARE_PROVIDER_SITE_OTHER): Payer: Self-pay | Admitting: Orthopedic Surgery

## 2017-02-19 DIAGNOSIS — K869 Disease of pancreas, unspecified: Secondary | ICD-10-CM | POA: Diagnosis not present

## 2017-02-26 DIAGNOSIS — C259 Malignant neoplasm of pancreas, unspecified: Secondary | ICD-10-CM | POA: Diagnosis not present

## 2017-02-27 DIAGNOSIS — H43812 Vitreous degeneration, left eye: Secondary | ICD-10-CM | POA: Diagnosis not present

## 2017-02-27 DIAGNOSIS — H31092 Other chorioretinal scars, left eye: Secondary | ICD-10-CM | POA: Diagnosis not present

## 2017-02-27 DIAGNOSIS — H2513 Age-related nuclear cataract, bilateral: Secondary | ICD-10-CM | POA: Diagnosis not present

## 2017-02-27 DIAGNOSIS — H401133 Primary open-angle glaucoma, bilateral, severe stage: Secondary | ICD-10-CM | POA: Diagnosis not present

## 2017-02-27 DIAGNOSIS — H17822 Peripheral opacity of cornea, left eye: Secondary | ICD-10-CM | POA: Diagnosis not present

## 2017-03-14 DIAGNOSIS — K869 Disease of pancreas, unspecified: Secondary | ICD-10-CM | POA: Diagnosis not present

## 2017-03-14 DIAGNOSIS — K8689 Other specified diseases of pancreas: Secondary | ICD-10-CM | POA: Diagnosis not present

## 2017-03-20 DIAGNOSIS — R918 Other nonspecific abnormal finding of lung field: Secondary | ICD-10-CM | POA: Diagnosis not present

## 2017-03-20 DIAGNOSIS — K869 Disease of pancreas, unspecified: Secondary | ICD-10-CM | POA: Diagnosis not present

## 2017-04-04 DIAGNOSIS — K861 Other chronic pancreatitis: Secondary | ICD-10-CM | POA: Diagnosis not present

## 2017-04-15 ENCOUNTER — Ambulatory Visit: Payer: Medicare HMO | Admitting: Pulmonary Disease

## 2017-04-29 ENCOUNTER — Emergency Department (HOSPITAL_COMMUNITY)
Admission: EM | Admit: 2017-04-29 | Discharge: 2017-04-29 | Disposition: A | Discharge status: Home / Self Care | Payer: Medicare HMO | Attending: Emergency Medicine | Admitting: Emergency Medicine

## 2017-04-29 ENCOUNTER — Encounter (HOSPITAL_COMMUNITY): Payer: Self-pay | Admitting: Emergency Medicine

## 2017-04-29 DIAGNOSIS — Z5321 Procedure and treatment not carried out due to patient leaving prior to being seen by health care provider: Secondary | ICD-10-CM | POA: Insufficient documentation

## 2017-04-29 DIAGNOSIS — R109 Unspecified abdominal pain: Secondary | ICD-10-CM | POA: Insufficient documentation

## 2017-04-29 DIAGNOSIS — H2513 Age-related nuclear cataract, bilateral: Secondary | ICD-10-CM | POA: Diagnosis not present

## 2017-04-29 DIAGNOSIS — H43812 Vitreous degeneration, left eye: Secondary | ICD-10-CM | POA: Diagnosis not present

## 2017-04-29 DIAGNOSIS — H401133 Primary open-angle glaucoma, bilateral, severe stage: Secondary | ICD-10-CM | POA: Diagnosis not present

## 2017-04-29 DIAGNOSIS — H17822 Peripheral opacity of cornea, left eye: Secondary | ICD-10-CM | POA: Diagnosis not present

## 2017-04-29 DIAGNOSIS — H31092 Other chorioretinal scars, left eye: Secondary | ICD-10-CM | POA: Diagnosis not present

## 2017-04-29 NOTE — ED Notes (Signed)
Called Pt in lobby for vital recheck, no response x2 for vitals. Pt has been called x3 with no response from staff.

## 2017-04-29 NOTE — ED Notes (Signed)
PT CALLED FOR BLOOD DRAW WITH NO RESPONSE. RN NOTIFIED.

## 2017-04-29 NOTE — ED Notes (Signed)
Called Pt in lobby for vital recheck, no response.

## 2017-04-29 NOTE — ED Triage Notes (Signed)
Pt complaint of LLQ pain with palpation only; denies associated symptoms; denies pain radiating to groin.

## 2017-05-02 DIAGNOSIS — R1032 Left lower quadrant pain: Secondary | ICD-10-CM | POA: Diagnosis not present

## 2017-05-02 DIAGNOSIS — K625 Hemorrhage of anus and rectum: Secondary | ICD-10-CM | POA: Diagnosis not present

## 2017-05-02 DIAGNOSIS — R05 Cough: Secondary | ICD-10-CM | POA: Diagnosis not present

## 2017-05-10 ENCOUNTER — Other Ambulatory Visit: Payer: Self-pay | Admitting: Pulmonary Disease

## 2017-05-16 DIAGNOSIS — K861 Other chronic pancreatitis: Secondary | ICD-10-CM | POA: Diagnosis not present

## 2017-05-20 DIAGNOSIS — R635 Abnormal weight gain: Secondary | ICD-10-CM | POA: Diagnosis not present

## 2017-05-20 DIAGNOSIS — Z0000 Encounter for general adult medical examination without abnormal findings: Secondary | ICD-10-CM | POA: Diagnosis not present

## 2017-05-20 DIAGNOSIS — Z136 Encounter for screening for cardiovascular disorders: Secondary | ICD-10-CM | POA: Diagnosis not present

## 2017-05-20 DIAGNOSIS — B182 Chronic viral hepatitis C: Secondary | ICD-10-CM | POA: Diagnosis not present

## 2017-05-20 DIAGNOSIS — N400 Enlarged prostate without lower urinary tract symptoms: Secondary | ICD-10-CM | POA: Diagnosis not present

## 2017-05-20 DIAGNOSIS — D860 Sarcoidosis of lung: Secondary | ICD-10-CM | POA: Diagnosis not present

## 2017-05-20 DIAGNOSIS — R7303 Prediabetes: Secondary | ICD-10-CM | POA: Diagnosis not present

## 2017-05-20 DIAGNOSIS — Z23 Encounter for immunization: Secondary | ICD-10-CM | POA: Diagnosis not present

## 2017-05-20 DIAGNOSIS — E785 Hyperlipidemia, unspecified: Secondary | ICD-10-CM | POA: Diagnosis not present

## 2017-05-27 ENCOUNTER — Encounter (HOSPITAL_COMMUNITY): Payer: Self-pay | Admitting: Emergency Medicine

## 2017-05-27 ENCOUNTER — Ambulatory Visit (HOSPITAL_COMMUNITY)
Admission: EM | Admit: 2017-05-27 | Discharge: 2017-05-27 | Disposition: A | Discharge status: Home / Self Care | Payer: Medicare HMO | Attending: Emergency Medicine | Admitting: Emergency Medicine

## 2017-05-27 DIAGNOSIS — H6123 Impacted cerumen, bilateral: Secondary | ICD-10-CM | POA: Diagnosis not present

## 2017-05-27 NOTE — ED Triage Notes (Signed)
Pt here for ear fullness; pt sts seen last week and told had wax in his ears and given drops

## 2017-05-27 NOTE — ED Provider Notes (Signed)
[place]    CSN: [id number] Arrival date & time: 05/27/17  1044     History   Chief Complaint Chief Complaint  Patient presents with  . Otalgia    HPI [name] is a 71 y.o. male.   Presenting today for bilateral cerumen impaction diagnosed by his PCP last week with no relief from OTC medication. Patient endorses bilateral hearing loss. He denies ear pain. He has no other complaint.   HPI  Past Medical History:  Diagnosis Date  . 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 Bladder diverticulum 12/17/2016  . History of GI bleed    upper GI bleed 07-20-2001  per EGD bleeding coming from pyloic channel/  09-02-2010 upper GI bleed -- per EGD erosive mucosa esophagastric region   . History of positive hepatitis C dx 06-03-2014   Genotype 1A (F0 to F1)  completed harvoni treatment 03/ 2016 and nondetectalbe  . History of squamous cell carcinoma excision    09-16-2007  re-excision lesion back / neck area  ,  low grade  . Pancreatic mass 12/12/2016   found on CT for hematuria work up  . Pulmonary sarcoidosis [facility] pulmologist-  dr clance/ dr Arlana Pouch   dx age 30/  confirmed dx via endobronchial bx RLL 09-15-2013    Patient Active Problem List   Diagnosis Date Noted  . Chronic hepatitis C without hepatic coma ([place]) 06/03/2014  . Endobronchial mass 10/12/2013  . Cough with hemoptysis 09/07/2013  . Sarcoidosis 09/04/2013  . History of positive PPD, untreated 09/04/2013  . Dyspnea on exertion 09/04/2013    Past Surgical History:  Procedure Laterality Date  . ESOPHAGOGASTRODUODENOSCOPY  last one 09-05-2010  . HERNIA REPAIR  1982  . RE-EXCISION LESION BACK/ NECK AREA  09/16/2007       Home Medications    Prior to Admission medications   Medication Sig Start Date End Date Taking? Authorizing Provider  BREO ELLIPTA 100-25 MCG/INH AEPB INHALE 1 PUFF INTO THE LUNGS EVERY DAY 05/13/17   Mannam, Praveen, MD    COMBIGAN 0.2-0.5 % ophthalmic solution Place 1 drop into both eyes 2 (two) times daily. As directed 06/19/14   [provider]  latanoprost (XALATAN) 0.005 % ophthalmic solution Place 1 drop into both eyes at bedtime. As directed 06/19/14   [provider]  nitrofurantoin, macrocrystal-monohydrate, (MACROBID) 100 MG capsule Take 1 capsule (100 mg total) by mouth at bedtime. 01/04/17   Festus Aloe, MD    Family History Family History  Problem Relation Age of Onset  . Hypertension Mother   . Heart failure Father   . Cancer - Prostate Brother     Social History Social History   Tobacco Use  . Smoking status: Never Smoker  . Smokeless tobacco: Never Used  Substance Use Topics  . Alcohol use: No  . Drug use: No     Allergies   Penicillins   Review of Systems Review of Systems  All other systems reviewed and are negative.   Physical Exam Triage Vital Signs ED Triage Vitals [05/27/17 1056]  Enc Vitals Group     BP (!) 154/100     Pulse Rate 77     Resp 18     Temp (!) 97.5 F (36.4 C)     Temp Source Oral     SpO2 96 %     Weight      Height      Head Circumference  Peak Flow      Pain Score      Pain Loc      Pain Edu?      Excl. in [place]?    No data found.  Updated Vital Signs BP (!) 154/100 (BP Location: Left Arm)   Pulse 77   Temp (!) 97.5 F (36.4 C) (Oral)   Resp 18   SpO2 96%   Physical Exam  Constitutional: He is oriented to person, place, and time. He appears well-developed and well-nourished.  HENT:  Head: Normocephalic and atraumatic.  Mouth/Throat: Oropharynx is clear and moist.  Cerumen impaction present bilaterally  Eyes: Pupils are equal, round, and reactive to light.  Cardiovascular: Normal rate.  Pulmonary/Chest: Effort normal.  Neurological: He is alert and oriented to person, place, and time.  Skin: Skin is warm and dry.  Nursing note and vitals reviewed.    UC Treatments / Results  Labs (all labs  ordered are listed, but only abnormal results are displayed) Labs Reviewed - No data to display  EKG  EKG Interpretation None       Radiology No results found.  Procedures Procedures (including critical care time)  Medications Ordered in UC Medications - No data to display   Initial Impression / Assessment and Plan / UC Course  I have reviewed the triage vital signs and the nursing notes.  Pertinent labs & imaging results that were available during my care of the patient were reviewed by me and considered in my medical decision making (see chart for details).  11:08: Pending for ear wax removal from clinical staff Final Clinical Impressions(s) / UC Diagnoses   Final diagnoses:  Bilateral impacted cerumen   Ear wax were successfully removed by the  Clinical nurse. Patient tolerated well. Patient discharged to f/u outpatient with PCP. Returned as needed.  ED Discharge Orders    None      Controlled Substance Prescriptions [place] Controlled Substance Registry consulted? Not Applicable   Barry Dienes, NP 05/27/17 1146

## 2017-06-17 DIAGNOSIS — K861 Other chronic pancreatitis: Secondary | ICD-10-CM | POA: Diagnosis not present

## 2017-06-26 ENCOUNTER — Ambulatory Visit: Payer: Medicare HMO | Admitting: Acute Care

## 2017-06-26 ENCOUNTER — Encounter: Payer: Self-pay | Admitting: Acute Care

## 2017-06-26 ENCOUNTER — Ambulatory Visit (INDEPENDENT_AMBULATORY_CARE_PROVIDER_SITE_OTHER)
Admission: RE | Admit: 2017-06-26 | Discharge: 2017-06-26 | Disposition: A | Discharge status: Home / Self Care | Payer: Medicare HMO | Source: Ambulatory Visit | Attending: Acute Care | Admitting: Acute Care

## 2017-06-26 VITALS — BP 126/78 | HR 110 | Ht 71.0 in | Wt 196.0 lb

## 2017-06-26 DIAGNOSIS — R05 Cough: Secondary | ICD-10-CM

## 2017-06-26 DIAGNOSIS — R0609 Other forms of dyspnea: Secondary | ICD-10-CM

## 2017-06-26 DIAGNOSIS — J440 Chronic obstructive pulmonary disease with acute lower respiratory infection: Secondary | ICD-10-CM

## 2017-06-26 DIAGNOSIS — R042 Hemoptysis: Secondary | ICD-10-CM

## 2017-06-26 DIAGNOSIS — J209 Acute bronchitis, unspecified: Secondary | ICD-10-CM

## 2017-06-26 DIAGNOSIS — D869 Sarcoidosis, unspecified: Secondary | ICD-10-CM | POA: Diagnosis not present

## 2017-06-26 DIAGNOSIS — R059 Cough, unspecified: Secondary | ICD-10-CM

## 2017-06-26 LAB — NITRIC OXIDE: NITRIC OXIDE: 5

## 2017-06-26 MED ORDER — BENZONATATE 100 MG PO CAPS: 100.0000 mg | ORAL_CAPSULE | Freq: Four times a day (QID) | ORAL | 0 refills | Status: DC | PRN

## 2017-06-26 MED ORDER — FLUTICASONE FUROATE-VILANTEROL 100-25 MCG/INH IN AEPB: 1.0000 | INHALATION_SPRAY | Freq: Every day | RESPIRATORY_TRACT | 0 refills | Status: DC

## 2017-06-26 MED ORDER — OMEPRAZOLE 20 MG PO CPDR: 20.0000 mg | DELAYED_RELEASE_CAPSULE | Freq: Every day | ORAL | 11 refills | Status: DC

## 2017-06-26 MED ORDER — FLUTICASONE FUROATE-VILANTEROL 100-25 MCG/INH IN AEPB: 1.0000 | INHALATION_SPRAY | Freq: Every day | RESPIRATORY_TRACT | 3 refills | Status: DC

## 2017-06-26 NOTE — Assessment & Plan Note (Signed)
Acute Bronchitis Doubt cough is related to sarcoid as patient has had no relief with prednisone Pt. Has not been using his Breo Plan: CXR today FENO today. >> 5ppb We will call you with results. Please remember to use your Breo 1 puff  every day. Remember to take this every day without fail. Rinse mouth after use Continue prednisone taper as previously prescribed We will schedule a PFT in 3 weeks Delsym every 12 hours for cough ( OTC) Tessalon Perles  Three times a day As needed  Cough  Sips of water instead of throat clearing Sugar free jolly ranchers for throat soothing Copy of GERD diet Omeprazole 20 mg daily for reflux. Mucinex 1200 mg daily with full glass of water. Follow up in 2 weeks with Judson Roch NP or Dr. Vaughan Browner Please contact office for sooner follow up if symptoms do not improve or worsen or seek emergency care

## 2017-06-26 NOTE — Patient Instructions (Addendum)
It is nice to meet you today. CXR today FENO today.  Continue  We will call you with results. Please remember to use your Breo 1 puff  every day. Remember to take this every day without fail. Rinse mouth after use We will schedule a PFT in 3 weeks Delsym every 12 hours for cough ( OTC) Tessalon Perles  Three times a day As needed  Cough  Sips of water instead of throat clearing Sugar free jolly ranchers for throat soothing Copy of GERD diet Omeprazole 20 mg daily for reflux. Mucinex 1200 mg daily with full glass of water. Follow up in 2 weeks with Judson Roch NP or Dr. Vaughan Browner Please contact office for sooner follow up if symptoms do not improve or worsen or seek emergency care

## 2017-06-26 NOTE — Assessment & Plan Note (Signed)
Sarcoidosis Less suspicious of flare as no improvement with prednisone taper No PFT's since 2015 Plan: PFT's at follow up. Consider repeat imaging/HRCT to evaluate for progression Follow up in 2 weeks.

## 2017-06-26 NOTE — Progress Notes (Signed)
History of Present Illness [name] is a 71 y.o. male never smoker with biopsy proven  Sarcoidosis. He is followed by Dr. Vaughan Browner.  HPI:  71 year old with past medical history of biopsy-proven sarcoidosis. The diagnosis was made by transbronchial biopsies in 2015. He also has a persistent endobronchial abnormality in the posterior basilar segment of the right lower lobe. Endobronchial biopsies did not reveal any malignancy. He had been maintained on chronic prednisone for some time. He was eventually tapered off the prednisone and started on an ICS/LABA combination. He stopped taking this too a year ago and had remained asymptomatic.  He was restarted on the Breo in 2017 after an episode of acute bronchitis with wheezing. He is doing well on with no new complaints. He has seen an ophthalmologist who told him that the sarcoidosis is not affecting his eyes. He is a nonsmoker. 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 An ultrasound-guided needle biopsy was attempted 3 times but the path shows atypical glandular cells. He had a CT of the chest abdomen pelvis this month which shows redemonstration of pulmonary nodules, adenopathy.Successful repeat biopsy attempt indicated autoimmune pancreatitis and not cancer. Repeat MR abdomen 06/17/2017 indicated resolution of previously seen pancreatic mass.  Addendum: Dr. Vaughan Browner reviewed the CT scan with Dr. Polly Cobia, Radiologist who compared to uploaded recent scan from [facility] with prior imaging. He agrees that there is no change in lung findings compared to previous scans dating back to 2015. He agrees with Dr. Matilde Bash  assessment that this looks like stable sarcoid. Dr. Vaughan Browner  discussed this with his oncologist at [facility]. There is no contraindication from pulmonary  viewpoint to proceeding with chemotherapy. Second biopsy  demonstrated pancreatitis no chemo was required.   06/26/2017 Acute OV for cough with phlegm and wheezing. Pt. States he has been having cough, wheezing and shortness of breath for greater than 6 weeks. He has been trying to treat this with over the counter medications like Mucinex, DayQuil, and Nyquil. He states his cough is minimally productive for what he thinks is white secretions. He was prescribed a prednisone taper by Dr. Newman Pies. 40 mg x 2 weeks, 35 mg x 2 weeks 30 mg x 2 weeks, 25 mg x 2 weeks, 20 mg x 2 weeks, 15 mg x 2 weeks , 10 mg x 2 weeks and then 5 mg x 2 weeks. He has just started the 10 mg dosing. He states he has not noticed a difference with the prednisone. He endorses sweats. He states cough got no better on prednisone. He has not been using his Breo daily. He states she has not used it in months. He did not realize he needed to use the [facility] every day. Didn't understand it was a maintenance medication. He denies chest pain, orthopnea or hemoptysis.  Test Results: 06/26/2017>> CXR>> pending Endobronchial biopsy 12/16/13- No granulomas or malignancy Transbronchial biopsies 09/15/13- Chronic granulomatous disease  PFTs 10/20/13 FVC 3.43 [4%] FEV1 2.59 (83%) F/F 76 TLC 74% DLCO 62%. Mild restriction with mild reduction in diffusion capacity.  Imaging  CT chest 08/31/15- Diffuse reticulonodular opacities, thickening of bronchovascular bundles. Calcified mediastinal and hilar lymph nodes. CXR 03/09/16- Chronic interstital infiltrates   High res CT 03/29/16-diffuse interstitial lung disease. No change from 08/2015. All images reviewed.  CT chest/abd/pelvis from [facility] 01/30/17 Multiple pulmonary nodules, hilar,  mediastinal lymphadenopathy. Right-sided lung nodules 4.6 centimeter lesion in the body of pancreas.  Labs 03/15/16 CMP, CBC - WNL ACE- 86  CBC Latest Ref Rng & Units 01/04/2017 03/15/2016 08/31/2015  WBC 4.0 - 10.5 K/uL - 10.1 4.8  Hemoglobin 13.0 - 17.0 g/dL 14.2 13.6 12.6(L)   Hematocrit 39.0 - 52.0 % - 39.7 39.2  Platelets 150.0 - 400.0 K/uL - 216.0 203    BMP Latest Ref Rng & Units 03/15/2016 08/31/2015 03/08/2015  Glucose 70 - 99 mg/dL 88 106(H) 95  BUN 6 - 23 mg/dL 18 13 15   Creatinine 0.40 - 1.50 mg/dL 0.77 0.75 0.81  Sodium 135 - 145 mEq/L 136 136 135  Potassium 3.5 - 5.1 mEq/L 3.6 3.5 3.7  Chloride 96 - 112 mEq/L 104 105 104  CO2 19 - 32 mEq/L 32 24 25  Calcium 8.4 - 10.5 mg/dL 8.4 8.6(L) 8.8(L)     ProBNP    Component Value Date/Time   PROBNP 57.0 09/01/2010 1555    PFT    Component Value Date/Time   FEV1PRE 2.67 10/20/2013 1019   FEV1POST 2.59 10/20/2013 1019   FVCPRE 3.57 10/20/2013 1019   FVCPOST 3.43 10/20/2013 1019   TLC 5.37 10/20/2013 1019   DLCOUNC 21.12 10/20/2013 1019   PREFEV1FVCRT 75 10/20/2013 1019   PSTFEV1FVCRT 76 10/20/2013 1019    No results found.   Past medical hx Past Medical History:  Diagnosis Date  . 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 Bladder diverticulum 12/17/2016  . History of GI bleed    upper GI bleed 07-20-2001  per EGD bleeding coming from pyloic channel/  09-02-2010 upper GI bleed -- per EGD erosive mucosa esophagastric region   . History of positive hepatitis C dx 06-03-2014   Genotype 1A (F0 to F1)  completed harvoni treatment 03/ 2016 and nondetectalbe  . History of squamous cell carcinoma excision    09-16-2007  re-excision lesion back / neck area  ,  low grade  . Pancreatic mass 12/12/2016   found on CT for hematuria work up  . Pulmonary sarcoidosis [facility]) pulmologist-  dr clance/ dr Arlana Pouch   dx age 79/  confirmed dx via endobronchial bx RLL 09-15-2013     Social History   Tobacco Use  . Smoking status: Never Smoker  . Smokeless tobacco: Never Used  Substance Use Topics  . Alcohol use: No  . Drug use: No    AlexPadilla reports that  has never smoked. he has never used smokeless tobacco. He reports that he does not drink alcohol or  use drugs.  Tobacco Cessation: Never smoker  Past surgical hx, Family hx, Social hx all reviewed.  Current Outpatient Medications on File Prior to Visit  Medication Sig  . BREO ELLIPTA 100-25 MCG/INH AEPB INHALE 1 PUFF INTO THE LUNGS EVERY DAY  . COMBIGAN 0.2-0.5 % ophthalmic solution Place 1 drop into both eyes 2 (two) times daily. As directed  . latanoprost (XALATAN) 0.005 % ophthalmic solution Place 1 drop into both eyes at bedtime. As directed  . nitrofurantoin, macrocrystal-monohydrate, (MACROBID) 100 MG capsule Take 1 capsule (100 mg total) by mouth at bedtime.  . predniSONE (DELTASONE) 2.5 MG tablet Take 2.5 mg by mouth daily with breakfast.   No current facility-administered medications on file prior to visit.      Allergies  Allergen Reactions  . Penicillins Other (See Comments)    Unknown reaction 30+years ago     Review Of Systems:  Constitutional:   No  weight loss, night sweats,  Fevers, chills, fatigue, or  lassitude.  HEENT:   No headaches,  Difficulty swallowing,  Tooth/dental problems, or  Sore throat,                No sneezing, itching, ear ache, nasal congestion, post nasal drip,   CV:  No chest pain,  Orthopnea, PND, swelling in lower extremities, anasarca, dizziness, palpitations, syncope.   GI  No heartburn, indigestion, abdominal pain, nausea, vomiting, diarrhea, change in bowel habits, loss of appetite, bloody stools.   Resp: + shortness of breath with exertion none at rest.  No excess mucus, + productive cough,  + non-productive cough,  No coughing up of blood.  No change in color of mucus.  + wheezing.  No chest wall deformity  Skin: no rash or lesions.  GU: no dysuria, change in color of urine, no urgency or frequency.  No flank pain, no hematuria   MS:  No joint pain or swelling.  No decreased range of motion.  No back pain.  Psych:  No change in mood or affect. No depression or anxiety.  No memory loss.   Vital Signs BP 126/78 (BP  Location: Left Arm, Cuff Size: Normal)   Pulse (!) 110   Ht 5\' 11"  (1.803 m)   Wt 196 lb (88.9 kg)   SpO2 94%   BMI 27.34 kg/m    Physical Exam:  General- No distress,  A&Ox3, pleasant, coughing ENT: No sinus tenderness, TM clear, pale nasal mucosa, no oral exudate,+ post nasal drip, no LAN, upper airway wheezing Cardiac: S1, S2, regular rate and rhythm, no murmur Chest: + wheeze/ no rales/ + dullness; no accessory muscle use, no nasal flaring, no sternal retractions, diminished per bases Abd.: Soft Non-tender, non-distended Ext: No clubbing cyanosis, edema Neuro:  normal strength Skin: No rashes, warm and dry Psych: normal mood and behavior, anxious   Assessment/Plan  Acute bronchitis Acute Bronchitis Doubt cough is related to sarcoid as patient has had no relief with prednisone Pt. Has not been using his Breo Plan: CXR today FENO today. >> 5ppb We will call you with results. Please remember to use your Breo 1 puff  every day. Remember to take this every day without fail. Rinse mouth after use Continue prednisone taper as previously prescribed We will schedule a PFT in 3 weeks Delsym every 12 hours for cough ( OTC) Tessalon Perles  Three times a day As needed  Cough  Sips of water instead of throat clearing Sugar free jolly ranchers for throat soothing Copy of GERD diet Omeprazole 20 mg daily for reflux. Mucinex 1200 mg daily with full glass of water. Follow up in 2 weeks with Judson Roch NP or Dr. Vaughan Browner Please contact office for sooner follow up if symptoms do not improve or worsen or seek emergency care    Sarcoidosis Sarcoidosis Less suspicious of flare as no improvement with prednisone taper No PFT's since 2015 Plan: PFT's at follow up. Consider repeat imaging/HRCT to evaluate for progression Follow up in 2 weeks.    Magdalen Spatz, NP 06/26/2017  7:02 PM

## 2017-07-15 ENCOUNTER — Encounter: Payer: Self-pay | Admitting: Acute Care

## 2017-07-15 ENCOUNTER — Ambulatory Visit (INDEPENDENT_AMBULATORY_CARE_PROVIDER_SITE_OTHER): Payer: Medicare HMO | Admitting: Acute Care

## 2017-07-15 DIAGNOSIS — J209 Acute bronchitis, unspecified: Secondary | ICD-10-CM

## 2017-07-15 NOTE — Patient Instructions (Addendum)
I am glad you are better. Continue to use your Breo 1 puff  every day. Remember to take this every day without fail. Rinse mouth after use We will schedule a PFT in 3 weeks Delsym every 12 hours for cough as needed  ( OTC) Sips of water instead of throat clearing as needed if cough returns Sugar free jolly ranchers for throat soothing as needed if cough returns  Copy of GERD diet Omeprazole 20 mg daily for reflux. Mucinex 1200 mg daily with full glass of water as needed if cough returns PFT January 2, as is scheduled Follow up in with Dr. Vaughan Browner in 2 months or before as needed. Please contact office for sooner follow up if symptoms do not improve or worsen or seek emergency care

## 2017-07-15 NOTE — Progress Notes (Signed)
History of Present Illness [name] is a 70 y.o. male never smoker with biopsy proven  Sarcoidosis. He is followed by Dr. Vaughan Browner.  HPI:71 year old with past medical history of biopsy-proven sarcoidosis. The diagnosis was made bytransbronchial biopsies in 2015. He also has a persistent endobronchial abnormality in the posterior basilar segment of the right lower lobe. Endobronchial biopsies did not reveal any malignancy. He had been maintained on chronic prednisone for some time. He was eventually tapered off the prednisone and started on an ICS/LABA combination. He stopped taking this too a year ago and had remained asymptomatic.   He was restarted on the [place] 2017after an episode of acute bronchitis with wheezing. He is doing well on with no new complaints. He has seen an ophthalmologist who told him that the sarcoidosis is not affecting his eyes. He is a nonsmoker. 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 Presents for follow up. He was seen 12/12 for acute exacerbation of bronchitis. Plan at that time was as follows:  Please remember to use your Breo 1 puff  every day. Remember to take this every day without fail. Rinse mouth after use Continue prednisone taper as previously prescribed  We will schedule a PFT in 3 weeks Delsym every 12 hours for cough ( OTC) Tessalon Perles  Three times a day As needed Cough  Sips of water instead of throat clearing Sugar free jolly ranchers for throat soothing Copy of GERD diet Omeprazole 20 mg daily for reflux. Mucinex 1200 mg daily with full glass of water. Follow up in 2 weeks with Judson Roch NP or Dr. Vaughan Browner Please contact office for sooner follow up if symptoms do not improve or worsen or seek emergency care   He presents today stating that he has improved since his last visit. His cough is better. He was given prednisone by another provider for pancreatitis which has given  him issues with itching skin.He has a follow up with that provider this week.  He has been complaint with his [facility] ( prior he had not been). He denies fever, chest pain, orthopnea or hemoptysis  Test Results: 06/26/2017>> CXR>> pending Endobronchial biopsy 12/16/13- No granulomas or malignancy Transbronchial biopsies 09/15/13- Chronic granulomatous disease  PFTs4/7/15 FVC 3.43 [4%] FEV1 2.59 (83%) F/F 76 TLC 74% DLCO 62%. Mild restriction with mild reduction in diffusion capacity.  Imaging  CT chest 08/31/15- Diffuse reticulonodular opacities, thickening of bronchovascular bundles. Calcified mediastinal and hilar lymph nodes. CXR 03/09/16- Chronic interstital infiltrates  High res CT 03/29/16-diffuse interstitial lung disease. No change from 08/2015. All images reviewed.  CT chest/abd/pelvis from [facility] 01/30/17 Multiple pulmonary nodules, hilar, mediastinal lymphadenopathy. Right-sided lung nodules 4.6 centimeter lesion in the body of pancreas.  Labs 03/15/16 CMP, CBC - WNL ACE- 86  CBC Latest Ref Rng & Units 01/04/2017 03/15/2016 08/31/2015  WBC 4.0 - 10.5 K/uL - 10.1 4.8  Hemoglobin 13.0 - 17.0 g/dL 14.2 13.6 12.6(L)  Hematocrit 39.0 - 52.0 % - 39.7 39.2  Platelets 150.0 - 400.0 K/uL - 216.0 203    BMP Latest Ref Rng & Units 03/15/2016 08/31/2015 03/08/2015  Glucose 70 - 99 mg/dL 88 106(H) 95  BUN 6 - 23 mg/dL 18 13 15   Creatinine 0.40 - 1.50 mg/dL 0.77 0.75 0.81  Sodium 135 - 145 mEq/L 136 136 135  Potassium 3.5 - 5.1 mEq/L 3.6 3.5 3.7  Chloride 96 - 112 mEq/L 104 105  104  CO2 19 - 32 mEq/L 32 24 25  Calcium 8.4 - 10.5 mg/dL 8.4 8.6(L) 8.8(L)    ProBNP    Component Value Date/Time   PROBNP 57.0 09/01/2010 1555    PFT    Component Value Date/Time   FEV1PRE 2.67 10/20/2013 1019   FEV1POST 2.59 10/20/2013 1019   FVCPRE 3.57 10/20/2013 1019   FVCPOST 3.43 10/20/2013 1019   TLC 5.37 10/20/2013 1019   DLCOUNC 21.12 10/20/2013 1019   PREFEV1FVCRT 75 10/20/2013 1019    PSTFEV1FVCRT 76 10/20/2013 1019    Dg Chest 2 View  Result Date: 06/27/2017 CLINICAL DATA:  Intermittent episodes of cough, wheezing, and dyspnea for the past 6 weeks. History of sarcoidosis EXAM: CHEST  2 VIEW COMPARISON:  CT scan chest of March 16, 2016 and chest x-ray of March 09, 2016. FINDINGS: The lungs are well-expanded. The interstitial markings are coarse and there is a micronodular appearance of the lung parenchyma bilaterally. The heart and pulmonary vascularity are normal. There is prominence of the hilar structures bilaterally compatible with lymphadenopathy. There is no pleural effusion. The bony thorax exhibits no acute abnormality. IMPRESSION: Persistent pulmonary parenchymal changes consistent with known sarcoidosis. No acute pneumonia nor CHF. Persistent bilateral hilar lymphadenopathy. Electronically Signed   By: David  Martinique M.D.   On: 06/27/2017 08:08     Past medical hx Past Medical History:  Diagnosis Date  . 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 Bladder diverticulum 12/17/2016  . History of GI bleed    upper GI bleed 07-20-2001  per EGD bleeding coming from pyloic channel/  09-02-2010 upper GI bleed -- per EGD erosive mucosa esophagastric region   . History of positive hepatitis C dx 06-03-2014   Genotype 1A (F0 to F1)  completed harvoni treatment 03/ 2016 and nondetectalbe  . History of squamous cell carcinoma excision    09-16-2007  re-excision lesion back / neck area  ,  low grade  . Pancreatic mass 12/12/2016   found on CT for hematuria work up  . Pulmonary sarcoidosis [facility]) pulmologist-  dr clance/ dr Arlana Pouch   dx age 55/  confirmed dx via endobronchial bx RLL 09-15-2013     Social History   Tobacco Use  . Smoking status: Never Smoker  . Smokeless tobacco: Never Used  Substance Use Topics  . Alcohol use: No  . Drug use: No    [name] reports that  has never smoked. he has never used smokeless  tobacco. He reports that he does not drink alcohol or use drugs.  Tobacco Cessation: Never smoker  Past surgical hx, Family hx, Social hx all reviewed.  Current Outpatient Medications on File Prior to Visit  Medication Sig  . BREO ELLIPTA 100-25 MCG/INH AEPB INHALE 1 PUFF INTO THE LUNGS EVERY DAY  . COMBIGAN 0.2-0.5 % ophthalmic solution Place 1 drop into both eyes 2 (two) times daily. As directed  . fluticasone furoate-vilanterol (BREO ELLIPTA) 100-25 MCG/INH AEPB Inhale 1 puff into the lungs daily.  Marland Kitchen latanoprost (XALATAN) 0.005 % ophthalmic solution Place 1 drop into both eyes at bedtime. As directed  . omeprazole (PRILOSEC) 20 MG capsule Take 1 capsule (20 mg total) by mouth daily.  . benzonatate (TESSALON) 100 MG capsule Take 1 capsule (100 mg total) by mouth every 6 (six) hours as needed for cough. (Patient not taking: Reported on 07/15/2017)  . nitrofurantoin, macrocrystal-monohydrate, (MACROBID) 100 MG capsule Take 1 capsule (100 mg total) by mouth at  bedtime. (Patient not taking: Reported on 07/15/2017)   No current facility-administered medications on file prior to visit.      Allergies  Allergen Reactions  . Penicillins Other (See Comments)    Unknown reaction 30+years ago     Review Of Systems:  Constitutional:   No  weight loss, night sweats,  Fevers, chills, fatigue, or  lassitude.  HEENT:   No headaches,  Difficulty swallowing,  Tooth/dental problems, or  Sore throat,                No sneezing, itching, ear ache, nasal congestion, post nasal drip,   CV:  No chest pain,  Orthopnea, PND, swelling in lower extremities, anasarca, dizziness, palpitations, syncope.   GI  No heartburn, indigestion, abdominal pain, nausea, vomiting, diarrhea, change in bowel habits, loss of appetite, bloody stools.   Resp: No shortness of breath with exertion or at rest.  No excess mucus, no productive cough,  No non-productive cough,  No coughing up of blood.  No change in color of  mucus.  No wheezing.  No chest wall deformity  Skin: no rash or lesions.  GU: no dysuria, change in color of urine, no urgency or frequency.  No flank pain, no hematuria   MS:  No joint pain or swelling.  No decreased range of motion.  No back pain.  Psych:  No change in mood or affect. No depression or anxiety.  No memory loss.   Vital Signs BP 118/80 (BP Location: Left Arm, Cuff Size: Normal)   Pulse 81   Ht 5\' 11"  (1.803 m)   Wt 197 lb 3.2 oz (89.4 kg)   SpO2 96%   BMI 27.50 kg/m    Physical Exam:  General- No distress,  A&Ox3, very pleasant ENT: No sinus tenderness, TM clear, pale nasal mucosa, no oral exudate,no post nasal drip, no LAN Cardiac: S1, S2, regular rate and rhythm, no murmur Chest: No wheeze/ rales/ dullness; no accessory muscle use, no nasal flaring, no sternal retractions Abd.: Soft Non-tender Ext: No clubbing cyanosis, edema Neuro:  normal strength Skin: No rashes, warm and dry Psych: normal mood and behavior   Assessment/Plan  Acute bronchitis Resolved after compliance with Breo Plan Continue to use your Breo 1 puff  every day. Remember to take this every day without fail. Rinse mouth after use We will schedule a PFT in 3 weeks Delsym every 12 hours for cough as needed  ( OTC) Sips of water instead of throat clearing as needed if cough returns Sugar free jolly ranchers for throat soothing as needed if cough returns  Copy of GERD diet Omeprazole 20 mg daily for reflux. Mucinex 1200 mg daily with full glass of water as needed if cough returns PFT January 2, as is scheduled Follow up in with Dr. Vaughan Browner in 2 months or before as needed. Please contact office for sooner follow up if symptoms do not improve or worsen or seek emergency care      Magdalen Spatz, NP 07/15/2017  1:24 PM

## 2017-07-15 NOTE — Assessment & Plan Note (Addendum)
Resolved after compliance with Breo Plan Continue to use your Breo 1 puff  every day. Remember to take this every day without fail. Rinse mouth after use We will schedule a PFT in 3 weeks Delsym every 12 hours for cough as needed  ( OTC) Sips of water instead of throat clearing as needed if cough returns Sugar free jolly ranchers for throat soothing as needed if cough returns  Copy of GERD diet Omeprazole 20 mg daily for reflux. Mucinex 1200 mg daily with full glass of water as needed if cough returns PFT January 2, as is scheduled Follow up in with Dr. Vaughan Browner in 2 months or before as needed. Please contact office for sooner follow up if symptoms do not improve or worsen or seek emergency care

## 2017-07-25 DIAGNOSIS — K859 Acute pancreatitis without necrosis or infection, unspecified: Secondary | ICD-10-CM | POA: Diagnosis not present

## 2017-07-25 DIAGNOSIS — L2084 Intrinsic (allergic) eczema: Secondary | ICD-10-CM | POA: Diagnosis not present

## 2017-07-25 DIAGNOSIS — K861 Other chronic pancreatitis: Secondary | ICD-10-CM | POA: Diagnosis not present

## 2017-08-07 DIAGNOSIS — R030 Elevated blood-pressure reading, without diagnosis of hypertension: Secondary | ICD-10-CM | POA: Diagnosis not present

## 2017-08-07 DIAGNOSIS — R635 Abnormal weight gain: Secondary | ICD-10-CM | POA: Diagnosis not present

## 2017-08-07 DIAGNOSIS — R7989 Other specified abnormal findings of blood chemistry: Secondary | ICD-10-CM | POA: Diagnosis not present

## 2017-08-07 DIAGNOSIS — D860 Sarcoidosis of lung: Secondary | ICD-10-CM | POA: Diagnosis not present

## 2017-08-07 DIAGNOSIS — K869 Disease of pancreas, unspecified: Secondary | ICD-10-CM | POA: Diagnosis not present

## 2017-08-09 DIAGNOSIS — K861 Other chronic pancreatitis: Secondary | ICD-10-CM | POA: Diagnosis not present

## 2017-08-21 ENCOUNTER — Encounter (INDEPENDENT_AMBULATORY_CARE_PROVIDER_SITE_OTHER): Payer: Self-pay | Admitting: Orthopedic Surgery

## 2017-08-21 ENCOUNTER — Ambulatory Visit (INDEPENDENT_AMBULATORY_CARE_PROVIDER_SITE_OTHER): Payer: Medicare HMO

## 2017-08-21 ENCOUNTER — Ambulatory Visit (INDEPENDENT_AMBULATORY_CARE_PROVIDER_SITE_OTHER): Payer: Medicare HMO | Admitting: Orthopedic Surgery

## 2017-08-21 DIAGNOSIS — M25562 Pain in left knee: Secondary | ICD-10-CM

## 2017-08-21 DIAGNOSIS — G8929 Other chronic pain: Secondary | ICD-10-CM | POA: Diagnosis not present

## 2017-08-21 MED ORDER — LIDOCAINE HCL 1 % IJ SOLN
5.0000 mL | INTRAMUSCULAR | Status: AC | PRN
  Administered 2017-08-21: 5 mL

## 2017-08-21 MED ORDER — BUPIVACAINE HCL 0.25 % IJ SOLN
4.0000 mL | INTRAMUSCULAR | Status: AC | PRN
  Administered 2017-08-21: 4 mL via INTRA_ARTICULAR

## 2017-08-21 MED ORDER — METHYLPREDNISOLONE ACETATE 40 MG/ML IJ SUSP
40.0000 mg | INTRAMUSCULAR | Status: AC | PRN
  Administered 2017-08-21: 40 mg via INTRA_ARTICULAR

## 2017-08-21 NOTE — Progress Notes (Signed)
Office Visit Note   Patient: [name]           Date of Birth: 1945/08/04           MRN: [id number] Visit Date: 08/21/2017 Requested by: Alex Cha, MD [address] [place], [place] [address] PCP: Alex Cha, MD  Subjective: Chief Complaint  Patient presents with  . Left Knee - Pain    HPI: [name] is a patient with bilateral knee pain left worse than right.  He had right knee arthroscopy in 2016 and Euflexxa in February 2017.  His right knee is not pain-free but in general he is doing well with it.  He describes several month history of left knee pain weakness giving way and falling.  He is fallen 3 times last week.  Does not report a history of injury to the left knee.  He states that "I fall every day".  He is seeing a new medical doctor next week.              ROS: All systems reviewed are negative as they relate to the chief complaint within the history of present illness.  Patient denies  fevers or chills.   Assessment & Plan: Visit Diagnoses:  1. Chronic pain of left knee     Plan: Impression is multiple falling events in a patient who has normal knee exam and normal knee radiographs.  Did have meniscal problems addressed in the right knee in 2016.  He has done reasonably well from that.  For now I think that an injection into the left knee is indicated.  Also some type of neurological workup for weakness and falling would be indicated.  We can scan the knee after that workup if it is negative.  At his age he will show some degenerative changes but I do not think that that is really causing him all of his symptoms at this time.  Follow-Up Instructions: Return if symptoms worsen or fail to improve.   Orders:  Orders Placed This Encounter  Procedures  . XR KNEE 3 VIEW LEFT   No orders of the defined types were placed in this encounter.     Procedures: Large Joint Inj: L knee on 08/21/2017 9:31 AM Indications: diagnostic evaluation,  joint swelling and pain Details: 18 G 1.5 in needle, superolateral approach  Arthrogram: No  Medications: 5 mL lidocaine 1 %; 40 mg methylPREDNISolone acetate 40 MG/ML; 4 mL bupivacaine 0.25 % Outcome: tolerated well, no immediate complications Procedure, treatment alternatives, risks and benefits explained, specific risks discussed. Consent was given by the patient. Immediately prior to procedure a time out was called to verify the correct patient, procedure, equipment, support staff and site/side marked as required. Patient was prepped and draped in the usual sterile fashion.       Clinical Data: No additional findings.  Objective: Vital Signs: There were no vitals taken for this visit.  Physical Exam:   Constitutional: Patient appears well-developed HEENT:  Head: Normocephalic Eyes:EOM are normal Neck: Normal range of motion Cardiovascular: Normal rate Pulmonary/chest: Effort normal Neurologic: Patient is alert Skin: Skin is warm Psychiatric: Patient has normal mood and affect    Ortho Exam: Orthopedic exam demonstrates full active and passive range of motion of the left knee with no effusion.  Collateral cruciate ligaments are stable.  Pedal pulses intact.  No joint line tenderness is present.  Patella tracks well.  No extensor mechanism tenderness.  No other masses lymphadenopathy or skin  changes noted in the left knee.  Hip flexion strength is intact and there is no groin pain with internal/external rotation of either leg.  No muscle atrophy in either leg.  Specialty Comments:  No specialty comments available.  Imaging: Xr Knee 3 View Left  Result Date: 08/21/2017 AP lateral merchant left knee reviewed.  Joint space is maintained and no osteophytes noted.  Patella well centered in the trochlear groove.  Tibial tubercle ossicle noted on the lateral view.    PMFS History: Patient Active Problem List   Diagnosis Date Noted  . Acute bronchitis 06/26/2017  . Chronic  hepatitis C without hepatic coma ([place]) 06/03/2014  . Endobronchial mass 10/12/2013  . Cough with hemoptysis 09/07/2013  . Sarcoidosis 09/04/2013  . History of positive PPD, untreated 09/04/2013  . Dyspnea on exertion 09/04/2013   Past Medical History:  Diagnosis Date  . 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 Bladder diverticulum 12/17/2016  . History of GI bleed    upper GI bleed 07-20-2001  per EGD bleeding coming from pyloic channel/  09-02-2010 upper GI bleed -- per EGD erosive mucosa esophagastric region   . History of positive hepatitis C dx 06-03-2014   Genotype 1A (F0 to F1)  completed harvoni treatment 03/ 2016 and nondetectalbe  . History of squamous cell carcinoma excision    09-16-2007  re-excision lesion back / neck area  ,  low grade  . Pancreatic mass 12/12/2016   found on CT for hematuria work up  . Pulmonary sarcoidosis [facility]) pulmologist-  dr clance/ dr Arlana Pouch   dx age 48/  confirmed dx via endobronchial bx RLL 09-15-2013    Family History  Problem Relation Age of Onset  . Hypertension Mother   . Heart failure Father   . Cancer - Prostate Brother     Past Surgical History:  Procedure Laterality Date  . CYSTOSCOPY WITH BIOPSY N/A 01/04/2017   Procedure: CYSTOSCOPY WITH BIOPSY AND FULGURATION;  Surgeon: Festus Aloe, MD;  Location: [facility];  Service: Urology;  Laterality: N/A;  . ESOPHAGOGASTRODUODENOSCOPY  last one 09-05-2010  . HERNIA REPAIR  1982  . RE-EXCISION LESION BACK/ NECK AREA  09/16/2007  . VIDEO BRONCHOSCOPY Bilateral 09/15/2013   Procedure: VIDEO BRONCHOSCOPY WITH FLUORO;  Surgeon: Kathee Delton, MD;  Location: [facility];  Service: Cardiopulmonary;  Laterality: Bilateral;  . VIDEO BRONCHOSCOPY Bilateral 12/16/2013   Procedure: VIDEO BRONCHOSCOPY WITHOUT FLUORO;  Surgeon: Kathee Delton, MD;  Location: [facility];  Service: Cardiopulmonary;  Laterality: Bilateral;   Social  History   Occupational History  . Not on file  Tobacco Use  . Smoking status: Never Smoker  . Smokeless tobacco: Never Used  Substance and Sexual Activity  . Alcohol use: No  . Drug use: No  . Sexual activity: Not on file

## 2017-08-23 DIAGNOSIS — M25562 Pain in left knee: Secondary | ICD-10-CM | POA: Diagnosis not present

## 2017-08-23 DIAGNOSIS — R030 Elevated blood-pressure reading, without diagnosis of hypertension: Secondary | ICD-10-CM | POA: Diagnosis not present

## 2017-08-27 DIAGNOSIS — H43812 Vitreous degeneration, left eye: Secondary | ICD-10-CM | POA: Diagnosis not present

## 2017-08-27 DIAGNOSIS — H17822 Peripheral opacity of cornea, left eye: Secondary | ICD-10-CM | POA: Diagnosis not present

## 2017-08-27 DIAGNOSIS — H2513 Age-related nuclear cataract, bilateral: Secondary | ICD-10-CM | POA: Diagnosis not present

## 2017-08-27 DIAGNOSIS — H31092 Other chorioretinal scars, left eye: Secondary | ICD-10-CM | POA: Diagnosis not present

## 2017-08-27 DIAGNOSIS — H401133 Primary open-angle glaucoma, bilateral, severe stage: Secondary | ICD-10-CM | POA: Diagnosis not present

## 2017-09-10 ENCOUNTER — Ambulatory Visit: Payer: Medicare HMO | Admitting: Endocrinology

## 2017-09-10 DIAGNOSIS — Z0289 Encounter for other administrative examinations: Secondary | ICD-10-CM

## 2017-09-11 ENCOUNTER — Other Ambulatory Visit: Payer: Self-pay

## 2017-09-11 ENCOUNTER — Ambulatory Visit (HOSPITAL_COMMUNITY)
Admission: EM | Admit: 2017-09-11 | Discharge: 2017-09-11 | Disposition: A | Discharge status: Home / Self Care | Payer: Medicare HMO | Attending: Urgent Care | Admitting: Urgent Care

## 2017-09-11 ENCOUNTER — Encounter (HOSPITAL_COMMUNITY): Payer: Self-pay | Admitting: Emergency Medicine

## 2017-09-11 DIAGNOSIS — R42 Dizziness and giddiness: Secondary | ICD-10-CM | POA: Diagnosis not present

## 2017-09-11 DIAGNOSIS — E860 Dehydration: Secondary | ICD-10-CM

## 2017-09-11 DIAGNOSIS — D869 Sarcoidosis, unspecified: Secondary | ICD-10-CM | POA: Diagnosis not present

## 2017-09-11 LAB — POCT I-STAT, CHEM 8
BUN: 23 mg/dL — AB (ref 6–20)
CREATININE: 0.8 mg/dL (ref 0.61–1.24)
Calcium, Ion: 1.21 mmol/L (ref 1.15–1.40)
Chloride: 103 mmol/L (ref 101–111)
GLUCOSE: 98 mg/dL (ref 65–99)
HEMATOCRIT: 48 % (ref 39.0–52.0)
Hemoglobin: 16.3 g/dL (ref 13.0–17.0)
POTASSIUM: 4.3 mmol/L (ref 3.5–5.1)
Sodium: 142 mmol/L (ref 135–145)
TCO2: 28 mmol/L (ref 22–32)

## 2017-09-11 LAB — POCT URINALYSIS DIP (DEVICE)
Bilirubin Urine: NEGATIVE
Glucose, UA: NEGATIVE mg/dL
Ketones, ur: NEGATIVE mg/dL
Leukocytes, UA: NEGATIVE
Nitrite: NEGATIVE
Protein, ur: NEGATIVE mg/dL
Specific Gravity, Urine: 1.025 (ref 1.005–1.030)
Urobilinogen, UA: 0.2 mg/dL (ref 0.0–1.0)
pH: 6 (ref 5.0–8.0)

## 2017-09-11 MED ORDER — MECLIZINE HCL 25 MG PO TABS: 25.0000 mg | ORAL_TABLET | Freq: Three times a day (TID) | ORAL | 0 refills | Status: DC | PRN

## 2017-09-11 NOTE — ED Provider Notes (Signed)
MRN: [id number] DOB: May 10, 1946  Subjective:   [name] is a 72 y.o. male presenting for sudden onset of dizziness today. Patient has felt unsteady with his balance. Denies headache, confusion, speech difficulty, sore throat, cough, chest pain, heart racing, palpitations, n/v, abdominal pain, dysuria, hematuria. Does not hydrate at all, drinks fruit juices some times. Has a history of sarcoidosis, is currently taking prednisone. Has an appointment with his PCP Dr. Newman Pies next week.   [name] is allergic to penicillins.  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 Also  has a past surgical history that includes Hernia repair (1982); Video bronchoscopy (Bilateral, 09/15/2013); Video bronchoscopy (Bilateral, 12/16/2013); RE-EXCISION LESION BACK/ NECK AREA (09/16/2007); Esophagogastroduodenoscopy (last one 09-05-2010); and Cystoscopy with biopsy (N/A, 01/04/2017).  Objective:   Vitals: BP (!) 143/88   Pulse 63   Temp 98 F (36.7 C) (Oral)   Resp 18   SpO2 99%   Physical Exam  Constitutional: He is oriented to person, place, and time. He appears well-developed and well-nourished.  HENT:  Mouth/Throat: Oropharynx is clear and moist.  Eyes: EOM are normal. Pupils are equal, round, and reactive to light. No scleral icterus.  Neck: Normal range of motion. Neck supple.  Cardiovascular: Normal rate, regular rhythm and intact distal pulses. Exam reveals no gallop and no friction rub.  No murmur heard. Pulmonary/Chest: No respiratory distress. He has no wheezes. He has no rales.  Musculoskeletal: He exhibits no edema.  Neurological: He is alert and oriented to person, place, and time. He  displays normal reflexes. No cranial nerve deficit. Coordination (braces himself to stand and walk) abnormal.  Skin: Skin is warm and dry.  Psychiatric: He has a normal mood and affect.    Orthostatic VS for the past 24 hrs:  BP- Lying Pulse- Lying BP- Sitting Pulse- Sitting BP- Standing at 0 minutes Pulse- Standing at 0 minutes  09/11/17 1759 (!) 142/91 56 139/87 65 (!) 138/97 68    Results for orders placed or performed during the hospital encounter of 09/11/17 (from the past 24 hour(s))  POCT urinalysis dip (device)     Status: Abnormal   Collection Time: 09/11/17  6:17 PM  Result Value Ref Range   Glucose, UA NEGATIVE NEGATIVE mg/dL   Bilirubin Urine NEGATIVE NEGATIVE   Ketones, ur NEGATIVE NEGATIVE mg/dL   Specific Gravity, Urine 1.025 1.005 - 1.030   Hgb urine dipstick MODERATE (A) NEGATIVE   pH 6.0 5.0 - 8.0   Protein, ur NEGATIVE NEGATIVE mg/dL   Urobilinogen, UA 0.2 0.0 - 1.0 mg/dL   Nitrite NEGATIVE NEGATIVE   Leukocytes, UA NEGATIVE NEGATIVE  I-STAT, chem 8     Status: Abnormal   Collection Time: 09/11/17  6:21 PM  Result Value Ref Range   Sodium 142 135 - 145 mmol/L   Potassium 4.3 3.5 - 5.1 mmol/L   Chloride 103 101 - 111 mmol/L   BUN 23 (H) 6 - 20 mg/dL   Creatinine, Ser 0.80 0.61 - 1.24 mg/dL   Glucose, Bld 98 65 - 99 mg/dL   Calcium, Ion 1.21 1.15 - 1.40 mmol/L   TCO2 28 22 - 32 mmol/L   Hemoglobin 16.3 13.0 - 17.0 g/dL   HCT 48.0 39.0 - 52.0 %   Assessment  and Plan :   Dizziness  Dehydration  Sarcoidosis  Strict ER precautions given. Will start hydrating adequately, use meclizine for symptomatic relief. Keep appointment with Dr. Newman Pies next week.    Jaynee Eagles, [place] 09/11/17 1851

## 2017-09-11 NOTE — ED Triage Notes (Signed)
Pt states he started feeling dizzy about 3pm this afternoon, pt states earlier he felt like he was about to pass out. Pt has difficulty ambulating with steady gait, leans to the side. Denies pain. No other neuro deficits.

## 2017-09-11 NOTE — Discharge Instructions (Signed)
Hydrate well with at least 2 liters (1 gallon) of water daily.  °

## 2017-09-12 ENCOUNTER — Ambulatory Visit: Payer: Medicare HMO | Admitting: Pulmonary Disease

## 2017-09-18 DIAGNOSIS — H31092 Other chorioretinal scars, left eye: Secondary | ICD-10-CM | POA: Diagnosis not present

## 2017-09-18 DIAGNOSIS — H2513 Age-related nuclear cataract, bilateral: Secondary | ICD-10-CM | POA: Diagnosis not present

## 2017-09-18 DIAGNOSIS — H17822 Peripheral opacity of cornea, left eye: Secondary | ICD-10-CM | POA: Diagnosis not present

## 2017-09-18 DIAGNOSIS — H43812 Vitreous degeneration, left eye: Secondary | ICD-10-CM | POA: Diagnosis not present

## 2017-09-18 DIAGNOSIS — H401133 Primary open-angle glaucoma, bilateral, severe stage: Secondary | ICD-10-CM | POA: Diagnosis not present

## 2017-09-19 DIAGNOSIS — K861 Other chronic pancreatitis: Secondary | ICD-10-CM | POA: Diagnosis not present

## 2017-10-07 ENCOUNTER — Ambulatory Visit: Payer: Medicare HMO | Admitting: Pulmonary Disease

## 2017-10-08 ENCOUNTER — Encounter (HOSPITAL_COMMUNITY): Payer: Self-pay | Admitting: Family Medicine

## 2017-10-08 ENCOUNTER — Ambulatory Visit (HOSPITAL_COMMUNITY)
Admission: EM | Admit: 2017-10-08 | Discharge: 2017-10-08 | Disposition: A | Discharge status: Home / Self Care | Payer: Medicare HMO | Attending: Family Medicine | Admitting: Family Medicine

## 2017-10-08 DIAGNOSIS — M545 Low back pain, unspecified: Secondary | ICD-10-CM

## 2017-10-08 MED ORDER — CYCLOBENZAPRINE HCL 10 MG PO TABS: 10.0000 mg | ORAL_TABLET | Freq: Three times a day (TID) | ORAL | 0 refills | Status: DC

## 2017-10-08 MED ORDER — DICLOFENAC SODIUM 75 MG PO TBEC: 75.0000 mg | DELAYED_RELEASE_TABLET | Freq: Two times a day (BID) | ORAL | 0 refills | Status: DC

## 2017-10-08 NOTE — ED Triage Notes (Addendum)
Pt here for lower back pain since Monday morning upon waking. Denies any injury, heavy lifting. sts pain is sharp and in the middle of his spine. Denies numbness or tingling, sts trouble walking.

## 2017-10-08 NOTE — Discharge Instructions (Signed)

## 2017-10-09 NOTE — ED Provider Notes (Signed)
[place]   [id number] 10/08/17 Arrival Time: 1500  ASSESSMENT & PLAN:  1. Acute bilateral low back pain without sciatica     Meds ordered this encounter  Medications  . diclofenac (VOLTAREN) 75 MG EC tablet    Sig: Take 1 tablet (75 mg total) by mouth 2 (two) times daily.    Dispense:  14 tablet    Refill:  0  . cyclobenzaprine (FLEXERIL) 10 MG tablet    Sig: Take 1 tablet (10 mg total) by mouth 3 (three) times daily.    Dispense:  20 tablet    Refill:  0   Will follow up with PCP or here if worsening or failing to improve as anticipated. Encouraged ROM.  Reviewed expectations re: course of current medical issues. Questions answered. Outlined signs and symptoms indicating need for more acute intervention. Patient verbalized understanding. After Visit Summary given.   SUBJECTIVE: History from: patient.  [name] is a 71 y.o. male who presents with complaint of intermittent bilateral lower back discomfort. Onset gradual, beginning approximately 2 days ago. Injury/trama: no. History of back problems: no, occasional LBP. Previous back surgery: no. Discomfort described as aching without radiation. Certain movements exacerbate the described discomfort. Better with rest. Extremity sensation changes or weakness: none. Ambulatory without difficulty. Normal bowel/bladder habits. No associated abdominal pain/n/v. Self treatment: tried OTCs without relief of pain.  Patient reports no fevers, IV drug use, recent back surgeries or procedures, urinary incontinence, or bowel incontinence.  ROS: As per HPI.   OBJECTIVE:  Vitals:   10/08/17 1528  BP: (!) 144/107  Pulse: 73  Resp: 20  Temp: 98.3 F (36.8 C)  SpO2: 99%    General appearance: alert; no distress Neck: supple with FROM; without midline tenderness Lungs: unlabored respirations; symmetrical air entry Abdomen: soft, non-tender; bowel sounds normal; no masses or organomegaly; no guarding or rebound  tenderness Back: bilateral lower tenderness present over bilateral paralumbar musculature; FROM at hips with mild discomfort reported; bruising: none; without midline tenderness Extremities: no cyanosis or edema; symmetrical with no gross deformities Skin: warm and dry Neurologic: normal gait; normal symmetric reflexes; normal LE strength and sensation Psychological: alert and cooperative; normal mood and affect  Allergies  Allergen Reactions  . Penicillins Other (See Comments)    Unknown reaction 30+years ago     Past Medical History:  Diagnosis Date  . 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 Bladder diverticulum 12/17/2016  . History of GI bleed    upper GI bleed 07-20-2001  per EGD bleeding coming from pyloic channel/  09-02-2010 upper GI bleed -- per EGD erosive mucosa esophagastric region   . History of positive hepatitis C dx 06-03-2014   Genotype 1A (F0 to F1)  completed harvoni treatment 03/ 2016 and nondetectalbe  . History of squamous cell carcinoma excision    09-16-2007  re-excision lesion back / neck area  ,  low grade  . Pancreatic mass 12/12/2016   found on CT for hematuria work up  . Pulmonary sarcoidosis [facility]) pulmologist-  dr clance/ dr Arlana Pouch   dx age 21/  confirmed dx via endobronchial bx RLL 09-15-2013   Social History   Socioeconomic History  . Marital status: Single    Spouse name: Not on file  . Number of children: Not on file  . Years of education: Not on file  . Highest education level: Not on file  Occupational History  . Not on file  Social Needs  . Financial resource strain: Not on file  . Food insecurity:    Worry: Not on file    Inability: Not on file  . Transportation needs:    Medical: Not on file    Non-medical: Not on file  Tobacco Use  . Smoking status: Never Smoker  . Smokeless tobacco: Never Used  Substance and Sexual Activity  . Alcohol use: No  . Drug use: No  . Sexual  activity: Not on file  Lifestyle  . Physical activity:    Days per week: Not on file    Minutes per session: Not on file  . Stress: Not on file  Relationships  . Social connections:    Talks on phone: Not on file    Gets together: Not on file    Attends religious service: Not on file    Active member of club or organization: Not on file    Attends meetings of clubs or organizations: Not on file    Relationship status: Not on file  . Intimate partner violence:    Fear of current or ex partner: Not on file    Emotionally abused: Not on file    Physically abused: Not on file    Forced sexual activity: Not on file  Other Topics Concern  . Not on file  Social History Narrative  . Not on file   Family History  Problem Relation Age of Onset  . Hypertension Mother   . Heart failure Father   . Cancer - Prostate Brother    Past Surgical History:  Procedure Laterality Date  . CYSTOSCOPY WITH BIOPSY N/A 01/04/2017   Procedure: CYSTOSCOPY WITH BIOPSY AND FULGURATION;  Surgeon: Festus Aloe, MD;  Location: [facility];  Service: Urology;  Laterality: N/A;  . ESOPHAGOGASTRODUODENOSCOPY  last one 09-05-2010  . HERNIA REPAIR  1982  . RE-EXCISION LESION BACK/ NECK AREA  09/16/2007  . VIDEO BRONCHOSCOPY Bilateral 09/15/2013   Procedure: VIDEO BRONCHOSCOPY WITH FLUORO;  Surgeon: Kathee Delton, MD;  Location: [facility];  Service: Cardiopulmonary;  Laterality: Bilateral;  . VIDEO BRONCHOSCOPY Bilateral 12/16/2013   Procedure: VIDEO BRONCHOSCOPY WITHOUT FLUORO;  Surgeon: Kathee Delton, MD;  Location: [facility];  Service: Cardiopulmonary;  Laterality: Bilateral;     Vanessa Kick, MD 10/09/17 1011

## 2017-10-10 DIAGNOSIS — H401123 Primary open-angle glaucoma, left eye, severe stage: Secondary | ICD-10-CM | POA: Diagnosis not present

## 2017-11-07 DIAGNOSIS — K861 Other chronic pancreatitis: Secondary | ICD-10-CM | POA: Diagnosis not present

## 2017-11-07 DIAGNOSIS — D8989 Other specified disorders involving the immune mechanism, not elsewhere classified: Secondary | ICD-10-CM | POA: Diagnosis not present

## 2017-12-12 DIAGNOSIS — K858 Other acute pancreatitis without necrosis or infection: Secondary | ICD-10-CM | POA: Diagnosis not present

## 2017-12-19 DIAGNOSIS — K861 Other chronic pancreatitis: Secondary | ICD-10-CM | POA: Diagnosis not present

## 2017-12-31 DIAGNOSIS — K861 Other chronic pancreatitis: Secondary | ICD-10-CM | POA: Diagnosis not present

## 2018-01-23 DIAGNOSIS — K861 Other chronic pancreatitis: Secondary | ICD-10-CM | POA: Diagnosis not present

## 2018-01-23 DIAGNOSIS — Z7952 Long term (current) use of systemic steroids: Secondary | ICD-10-CM | POA: Diagnosis not present

## 2018-04-07 ENCOUNTER — Encounter: Payer: Self-pay | Admitting: Physical Therapy

## 2018-04-07 ENCOUNTER — Other Ambulatory Visit: Payer: Self-pay

## 2018-04-07 ENCOUNTER — Ambulatory Visit: Payer: Medicare HMO | Attending: Family Medicine | Admitting: Physical Therapy

## 2018-04-07 DIAGNOSIS — R260 Ataxic gait: Secondary | ICD-10-CM | POA: Diagnosis present

## 2018-04-07 DIAGNOSIS — R262 Difficulty in walking, not elsewhere classified: Secondary | ICD-10-CM | POA: Insufficient documentation

## 2018-04-07 DIAGNOSIS — M6281 Muscle weakness (generalized): Secondary | ICD-10-CM | POA: Diagnosis present

## 2018-04-07 NOTE — Therapy (Signed)
[place] [address] [place], [place], [address] Phone: [phone]   Fax:  [phone]  Physical Therapy Evaluation  Patient Details  Name: [name] MRN: [id number] Date of Birth: 01/01/1946 Referring Provider: Coletta Memos   Encounter Date: 04/07/2018  PT End of Session - 04/07/18 0818    Visit Number  1    Date for PT Re-Evaluation  06/07/18    PT Start Time  6073    PT Stop Time  0835    PT Time Calculation (min)  41 min    Activity Tolerance  Patient tolerated treatment well    Behavior During Therapy  [facility] for tasks assessed/performed       Past Medical History:  Diagnosis Date  . 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 Bladder diverticulum 12/17/2016  . History of GI bleed    upper GI bleed 07-20-2001  per EGD bleeding coming from pyloic channel/  09-02-2010 upper GI bleed -- per EGD erosive mucosa esophagastric region   . History of positive hepatitis C dx 06-03-2014   Genotype 1A (F0 to F1)  completed harvoni treatment 03/ 2016 and nondetectalbe  . History of squamous cell carcinoma excision    09-16-2007  re-excision lesion back / neck area  ,  low grade  . Pancreatic mass 12/12/2016   found on CT for hematuria work up  . Pulmonary sarcoidosis [facility]) pulmologist-  dr clance/ dr Arlana Pouch   dx age 72/  confirmed dx via endobronchial bx RLL 09-15-2013    Past Surgical History:  Procedure Laterality Date  . CYSTOSCOPY WITH BIOPSY N/A 01/04/2017   Procedure: CYSTOSCOPY WITH BIOPSY AND FULGURATION;  Surgeon: Festus Aloe, MD;  Location: [facility];  Service: Urology;  Laterality: N/A;  . ESOPHAGOGASTRODUODENOSCOPY  last one 09-05-2010  . HERNIA REPAIR  1982  . RE-EXCISION LESION BACK/ NECK AREA  09/16/2007  . VIDEO BRONCHOSCOPY Bilateral 09/15/2013   Procedure: VIDEO BRONCHOSCOPY WITH FLUORO;  Surgeon: Kathee Delton, MD;  Location: [facility]  ENDOSCOPY;  Service: Cardiopulmonary;  Laterality: Bilateral;  . VIDEO BRONCHOSCOPY Bilateral 12/16/2013   Procedure: VIDEO BRONCHOSCOPY WITHOUT FLUORO;  Surgeon: Kathee Delton, MD;  Location: [facility] ENDOSCOPY;  Service: Cardiopulmonary;  Laterality: Bilateral;    There were no vitals filed for this visit.   Subjective Assessment - 04/07/18 0755    Subjective  Patient reports that about a year and a half ago he started having some difficulty going up and down stairs, reports that he has had about 7 falls in the past year, reports once he falls he cannot get up without help.  He reports his knees feel weak.        Limitations  Walking    Patient Stated Goals  be stronger and not fall    Currently in Pain?  Yes    Pain Score  1     Pain Location  Knee    Pain Orientation  Left;Right    Pain Descriptors / Indicators  Aching    Pain Type  Chronic pain    Pain Onset  More than a month ago    Pain Frequency  Intermittent    Aggravating Factors   stairs    Pain Relieving Factors  rest    Effect of Pain on Daily Activities  fear of falling, "embarrassing at a ballgame"         [facility] PT Assessment - 04/07/18  0001      Assessment   Medical Diagnosis  falls, weakness    Referring Provider  [place]    Onset Date/Surgical Date  03/07/18    Prior Therapy  no      Precautions   Precautions  Fall      Balance Screen   Has the patient fallen in the past 6 months  Yes    How many times?  5    Has the patient had a decrease in activity level because of a fear of falling?   Yes    Is the patient reluctant to leave their home because of a fear of falling?   No      Home Environment   Additional Comments  has steps into the home, he does a little yardwork      Prior Function   Level of Independence  Independent    Vocation  Retired    Leisure  no exercise      Posture/Postural Control   Posture Comments  fwd head      ROM / Strength   AROM / PROM / Strength  AROM;Strength      AROM    Overall AROM Comments  left knee AROM is lacking 30 degrees from TKE, roight knee is 20 degrees from TKE      Strength   Overall Strength Comments  hips 4-/5, knee extension 4-/5, knee flexion 4+/5, ankles 4+/5      Palpation   Palpation comment  mild tenderness in the patella areas      Ambulation/Gait   Gait Comments  no device, slow, tends to hold onto walls,, going down stairs he goes one at a time, the left leg first, when trying to go step over step this is very poor quality, going up he has to really pull himself up to go step over step, again the left leg looks weaker.  He tends to snap the left leg back more than the right with the stairs and some ataxic type gait with teh LE's on lever ground, with a lack of control, again going back into full extension.      Standardized Balance Assessment   Standardized Balance Assessment  Berg Balance Test;Timed Up and Go Test      Berg Balance Test   Sit to Stand  Able to stand using hands after several tries    Standing Unsupported  Able to stand safely 2 minutes    Sitting with Back Unsupported but Feet Supported on Floor or Stool  Able to sit safely and securely 2 minutes    Stand to Sit  Controls descent by using hands    Transfers  Able to transfer safely, definite need of hands    Standing Unsupported with Eyes Closed  Able to stand 10 seconds safely    Standing Ubsupported with Feet Together  Able to place feet together independently and stand 1 minute safely    From Standing, Reach Forward with Outstretched Arm  Can reach confidently >25 cm (10")    From Standing Position, Pick up Object from Floor  Able to pick up shoe safely and easily    From Standing Position, Turn to Look Behind Over each Shoulder  Turn sideways only but maintains balance    Turn 360 Degrees  Able to turn 360 degrees safely but slowly    Standing Unsupported, Alternately Place Feet on Step/Stool  Able to complete 4 steps without aid or supervision    Standing  Unsupported, One Foot in [place] to take small step independently and hold 30 seconds    Standing on One Leg  Tries to lift leg/unable to hold 3 seconds but remains standing independently    Total Score  41      Timed Up and Go Test   Normal TUG (seconds)  15                Objective measurements completed on examination: See above findings.              PT Education - 04/07/18 0909    Education Details  TKE and some standing 4 way hips    Person(s) Educated  Patient    Methods  Explanation;Demonstration;Handout    Comprehension  Verbalized understanding       PT Short Term Goals - 04/07/18 0909      PT SHORT TERM GOAL #1   Title  independent with initial HEP    Time  2    Period  Weeks    Status  New        PT Long Term Goals - 04/07/18 0909      PT LONG TERM GOAL #1   Title  increase left knee AROM to 15 degrees from TKE    Time  8    Period  Weeks    Status  New      PT LONG TERM GOAL #2   Title  no falls in 6 week period    Time  8    Period  Weeks    Status  New      PT LONG TERM GOAL #3   Title  increase BERG score to 47/56    Time  8    Period  Weeks    Status  New      PT LONG TERM GOAL #4   Title  understand safety and gait with a cane    Time  8    Period  Weeks    Status  New      PT LONG TERM GOAL #5   Title  go up and down stairs step over step safely with one handrail    Time  8    Period  Weeks    Status  New             Plan - 04/07/18 0819    Clinical Impression Statement  Patient has a history of 7 falls in the past year, he reports a great deal of difficulty on stairs, a recent fall was at a football game and he could not go up or down bleachers and was embarrassed.  He has ataxic motions of the LE's, left worse than the right, the knee extension strength is 4-/5, really struggles going up or down stairs.  He is lacking 20 and 30 degrees of TKE, has some mild c/o pain but this seems to be his biggest  issue and the reason that he has the ataxic gait with him taking his knees into full extnesion to walk    Clinical Presentation  Stable    Clinical Decision Making  Low    Rehab Potential  Good    PT Frequency  2x / week    PT Duration  8 weeks    PT Treatment/Interventions  ADLs/Self Care Home Management;Electrical Stimulation;Cryotherapy;Moist Heat;Iontophoresis 4mg /ml Dexamethasone;Gait training;Neuromuscular re-education;Balance training;Therapeutic exercise;Therapeutic activities;Functional mobility training;Stair training;Patient/family education;Manual techniques    PT Next Visit Plan  work  on TKE, balance, strength and endurance    Consulted and Agree with Plan of Care  Patient       Patient will benefit from skilled therapeutic intervention in order to improve the following deficits and impairments:  Abnormal gait, Decreased coordination, Difficulty walking, Decreased safety awareness, Decreased activity tolerance, Pain, Decreased balance, Decreased mobility, Decreased strength  Visit Diagnosis: Muscle weakness (generalized) - Plan: PT plan of care cert/re-cert  Difficulty in walking, not elsewhere classified - Plan: PT plan of care cert/re-cert  Ataxic gait - Plan: PT plan of care cert/re-cert     Problem List Patient Active Problem List   Diagnosis Date Noted  . Acute bronchitis 06/26/2017  . Chronic hepatitis C without hepatic coma ([place]) 06/03/2014  . Endobronchial mass 10/12/2013  . Cough with hemoptysis 09/07/2013  . Sarcoidosis 09/04/2013  . History of positive PPD, untreated 09/04/2013  . Dyspnea on exertion 09/04/2013    Sumner Boast., PT 04/07/2018, 9:12 AM  [address] [place], [place], [address] Phone: [phone]   Fax:  [phone]  Name: NAHSIR VENEZIA MRN: [id number] Date of Birth: Mar 31, 1946

## 2018-04-11 ENCOUNTER — Ambulatory Visit: Payer: Medicare HMO | Admitting: Physical Therapy

## 2018-04-14 ENCOUNTER — Encounter: Payer: Medicare HMO | Admitting: Physical Therapy

## 2018-04-18 ENCOUNTER — Encounter: Payer: Medicare HMO | Admitting: Physical Therapy

## 2018-04-21 ENCOUNTER — Encounter (HOSPITAL_COMMUNITY): Payer: Self-pay | Admitting: Emergency Medicine

## 2018-04-21 ENCOUNTER — Emergency Department (HOSPITAL_COMMUNITY)
Admission: EM | Admit: 2018-04-21 | Discharge: 2018-04-21 | Discharge status: Against Medical Advice | Payer: Medicare HMO | Attending: Emergency Medicine | Admitting: Emergency Medicine

## 2018-04-21 ENCOUNTER — Encounter: Payer: Medicare HMO | Admitting: Physical Therapy

## 2018-04-21 DIAGNOSIS — K068 Other specified disorders of gingiva and edentulous alveolar ridge: Secondary | ICD-10-CM | POA: Diagnosis not present

## 2018-04-21 DIAGNOSIS — Z98818 Other dental procedure status: Secondary | ICD-10-CM | POA: Diagnosis not present

## 2018-04-21 DIAGNOSIS — Z79899 Other long term (current) drug therapy: Secondary | ICD-10-CM | POA: Diagnosis not present

## 2018-04-21 DIAGNOSIS — R58 Hemorrhage, not elsewhere classified: Secondary | ICD-10-CM

## 2018-04-21 NOTE — ED Notes (Addendum)
Patient choosing to leave at this time. Patient walking out with steady gait with significant other, no immediate acute needs identified. Risks explained to patient, patient still wants to leave at this time. Provider aware.

## 2018-04-21 NOTE — ED Triage Notes (Signed)
Pt had 11 teeth and some bones cut and plates placed in mouth this morning at [facility]. Bleeding has continued since the surgery this morning. Wife reports has been steady flow and head hurting.

## 2018-04-21 NOTE — ED Provider Notes (Signed)
[facility] DEPT Provider Note   CSN: [id number] Arrival date & time: 04/21/18  1655     History   Chief Complaint Chief Complaint  Patient presents with  . mouth bleeding  . Post-op Problem    HPI [name] is a 72 y.o. male.  HPI Patient presented to the emergency room for evaluation of dental bleeding.  Patient states he had 11 teeth pulled today.  Also had dentures made.  Patient was told that if he had any persistent bleeding to return to the emergency room.  Patient is also having pain where his teeth were pulled.  Patient states he has had some steady blood flow since this morning.  Since arriving here in the emergency room however the bleeding has decreased. Past Medical History:  Diagnosis Date  . 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 Bladder diverticulum 12/17/2016  . History of GI bleed    upper GI bleed 07-20-2001  per EGD bleeding coming from pyloic channel/  09-02-2010 upper GI bleed -- per EGD erosive mucosa esophagastric region   . History of positive hepatitis C dx 06-03-2014   Genotype 1A (F0 to F1)  completed harvoni treatment 03/ 2016 and nondetectalbe  . History of squamous cell carcinoma excision    09-16-2007  re-excision lesion back / neck area  ,  low grade  . Pancreatic mass 12/12/2016   found on CT for hematuria work up  . Pulmonary sarcoidosis [facility]) pulmologist-  dr clance/ dr Arlana Pouch   dx age 26/  confirmed dx via endobronchial bx RLL 09-15-2013    Patient Active Problem List   Diagnosis Date Noted  . Acute bronchitis 06/26/2017  . Chronic hepatitis C without hepatic coma ([place]) 06/03/2014  . Endobronchial mass 10/12/2013  . Cough with hemoptysis 09/07/2013  . Sarcoidosis 09/04/2013  . History of positive PPD, untreated 09/04/2013  . Dyspnea on exertion 09/04/2013    Past Surgical History:  Procedure Laterality Date  . CYSTOSCOPY WITH BIOPSY N/A 01/04/2017    Procedure: CYSTOSCOPY WITH BIOPSY AND FULGURATION;  Surgeon: Festus Aloe, MD;  Location: [facility];  Service: Urology;  Laterality: N/A;  . ESOPHAGOGASTRODUODENOSCOPY  last one 09-05-2010  . HERNIA REPAIR  1982  . RE-EXCISION LESION BACK/ NECK AREA  09/16/2007  . VIDEO BRONCHOSCOPY Bilateral 09/15/2013   Procedure: VIDEO BRONCHOSCOPY WITH FLUORO;  Surgeon: Kathee Delton, MD;  Location: [facility];  Service: Cardiopulmonary;  Laterality: Bilateral;  . VIDEO BRONCHOSCOPY Bilateral 12/16/2013   Procedure: VIDEO BRONCHOSCOPY WITHOUT FLUORO;  Surgeon: Kathee Delton, MD;  Location: [facility];  Service: Cardiopulmonary;  Laterality: Bilateral;        Home Medications    Prior to Admission medications   Medication Sig Start Date End Date Taking? Authorizing Provider  COMBIGAN 0.2-0.5 % ophthalmic solution Place 1 drop into both eyes 2 (two) times daily. As directed 06/19/14  Yes [provider]  dorzolamide (TRUSOPT) 2 % ophthalmic solution Place 1 drop into the left eye 4 (four) times daily.   Yes [provider]  latanoprost (XALATAN) 0.005 % ophthalmic solution Place 1 drop into both eyes at bedtime. As directed 06/19/14  Yes [provider]  predniSONE (DELTASONE) 5 MG tablet Take 5 mg by mouth daily. 04/14/18  Yes [provider]  timolol (TIMOPTIC) 0.5 % ophthalmic solution Place 1 drop into both eyes 2 (two) times daily.  05/19/17  Yes [provider]  ACCU-CHEK  AVIVA PLUS test strip  05/24/17   [provider]  ACCU-CHEK SOFTCLIX LANCETS lancets  05/24/17   [provider]  acetaminophen-codeine (TYLENOL #3) 300-30 MG tablet Take 1 tablet by mouth every 4 (four) hours as needed for pain. 04/21/18   [provider]  Blood Glucose Monitoring Suppl (ACCU-CHEK AVIVA PLUS) w/Device KIT  05/24/17   [provider]  clindamycin (CLEOCIN) 300 MG capsule Take 300 mg by mouth 3 (three) times daily.  Take 1 capsule three times a day until gone. 7 day supply. 04/21/18   [provider]  cyclobenzaprine (FLEXERIL) 10 MG tablet Take 1 tablet (10 mg total) by mouth 3 (three) times daily. Patient not taking: Reported on 04/21/2018 10/08/17   Vanessa Kick, MD    Family History Family History  Problem Relation Age of Onset  . Hypertension Mother   . Heart failure Father   . Cancer - Prostate Brother     Social History Social History   Tobacco Use  . Smoking status: Never Smoker  . Smokeless tobacco: Never Used  Substance Use Topics  . Alcohol use: No  . Drug use: No     Allergies   Penicillins   Review of Systems Review of Systems  Constitutional: Negative for diaphoresis and fever.  Cardiovascular: Negative for chest pain.  Gastrointestinal: Negative for abdominal pain.  Neurological: Negative for light-headedness.  All other systems reviewed and are negative.    Physical Exam Updated Vital Signs BP (!) 169/120 (BP Location: Left Arm)   Pulse 80   Resp 20   SpO2 100%   Physical Exam  Constitutional: He appears well-developed and well-nourished. No distress.  HENT:  Head: Normocephalic and atraumatic.  Right Ear: External ear normal.  Left Ear: External ear normal.  Status post dental extractions, sutures noted in the gums with some fresh clot, no active bleeding  Eyes: Conjunctivae are normal. Right eye exhibits no discharge. Left eye exhibits no discharge. No scleral icterus.  Neck: Neck supple. No tracheal deviation present.  Cardiovascular: Normal rate.  Pulmonary/Chest: Effort normal. No stridor. No respiratory distress.  Abdominal: He exhibits no distension.  Musculoskeletal: He exhibits no edema.  Neurological: He is alert. Cranial nerve deficit: no gross deficits.  Skin: Skin is warm and dry. No rash noted.  Psychiatric: He has a normal mood and affect.  Nursing note and vitals reviewed.    ED Treatments / Results  Labs (all labs ordered  are listed, but only abnormal results are displayed) Labs Reviewed  CBC  TYPE AND SCREEN     Procedures Procedures (including critical care time)  Medications Ordered in ED Medications - No data to display   Initial Impression / Assessment and Plan / ED Course  I have reviewed the triage vital signs and the nursing notes.  Pertinent labs & imaging results that were available during my care of the patient were reviewed by me and considered in my medical decision making (see chart for details).   After I evaluated the patient, patient asked to go home.  He took 1 of his pain pills and he feels like the bleeding has slowed down.  I discussed checking his blood count and monitoring him here in the emergency room.  Patient states he really would just like to go home at this point.   He understands he can return at any time especially if the bleeding returns.  Patient did not want to wait for discharge paperwork Final Clinical Impressions(s) / ED  Diagnoses   Final diagnoses:  Bleeding    ED Discharge Orders    None       Dorie Rank, MD 04/21/18 1747

## 2018-04-25 ENCOUNTER — Encounter: Payer: Medicare HMO | Admitting: Physical Therapy

## 2018-12-02 IMAGING — MR MR 3D RECON AT SCANNER
19 of 23 series · 19 of 23 positions shown · IV contrast (multihance)
Comparison: 11/22/2016 CT abdomen/pelvis.

CLINICAL DATA: Evaluate possible pancreatic body mass incidentally
detected on recent hematuria protocol CT study. History of thoracic
sarcoidosis.

EXAM:
MRI ABDOMEN WITHOUT AND WITH CONTRAST (INCLUDING MRCP)
TECHNIQUE: Multiplanar multisequence MR imaging of the abdomen was performed
both before and after the administration of intravenous contrast.
Heavily T2-weighted images of the biliary and pancreatic ducts were
obtained, and three-dimensional MRCP images were rendered by post
processing.
CONTRAST:  17mL MULTIHANCE GADOBENATE DIMEGLUMINE 529 MG/ML IV SOLN

[Series 3: T2 fat-sat · axial · 5.0mm · 0.78mm/px · 1 of 56 slices shown]
[im 1/56]
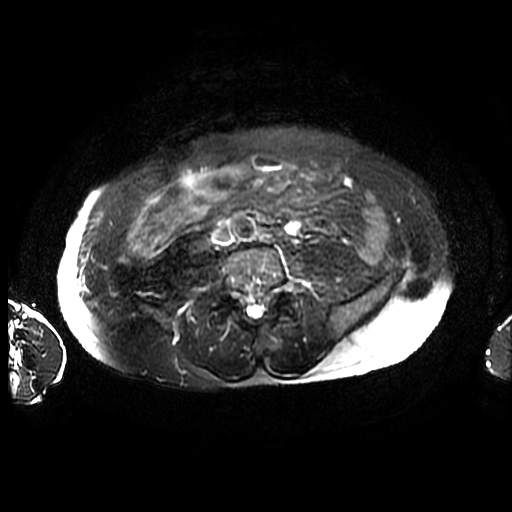

[Series 4: MRCP · coronal · 1.6mm · 0.62mm/px · 1 of 149 slices shown (1 of 2)]
[im 1/149]
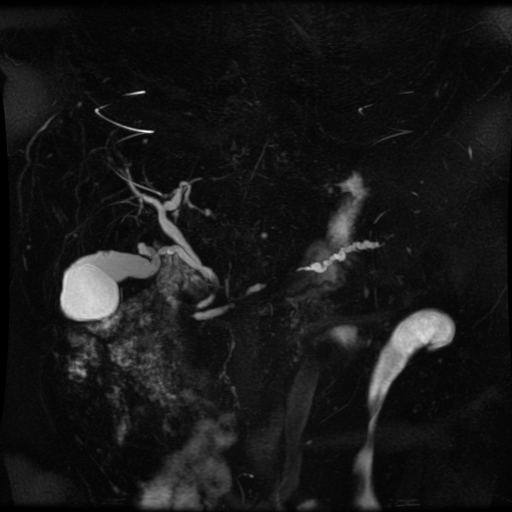

[Series 6: DWI b500 · axial · 6.0mm · 1.48mm/px · 1 of 79 slices shown]
[im 1/79]
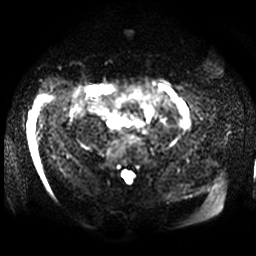

[Series 7: ax dualecho · axial · 5.0mm · 0.78mm/px · 1 of 112 slices shown]
[im 1/112]
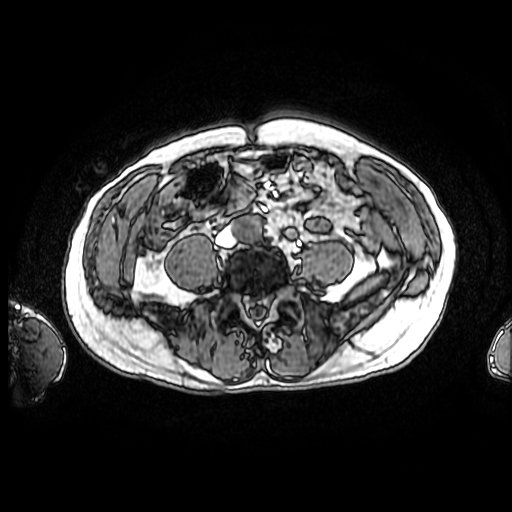

[Series 8: MRCP · coronal · 40.0mm · 0.70mm/px · 1 of 9 slices shown (2 of 2)]
[im 1/9]
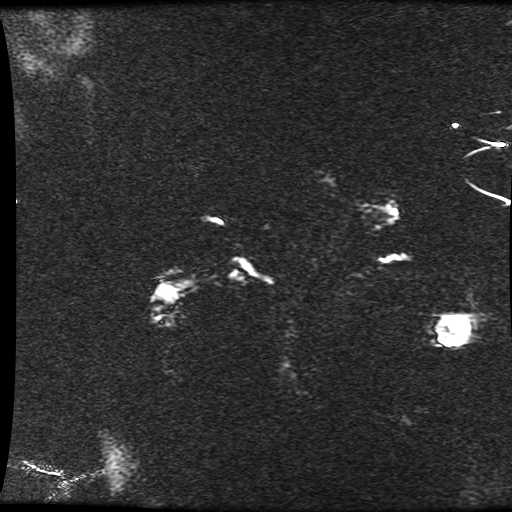

[Series 9: bSSFP fat-sat · coronal · 5.0mm · 0.70mm/px · 1 of 46 slices shown]
[im 1/46]
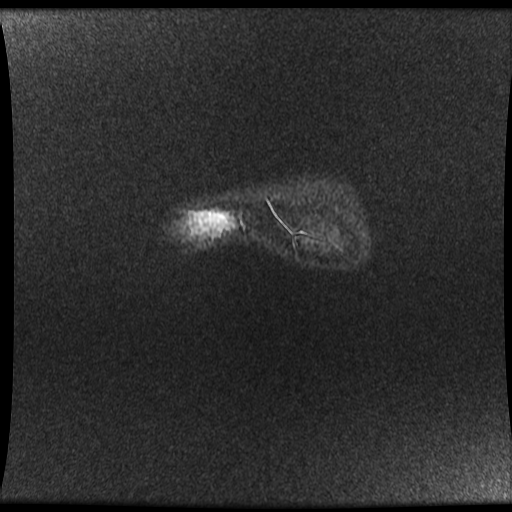

[Series 10: T2 · axial · 5.0mm · 0.78mm/px · 1 of 56 slices shown]
[im 1/56]
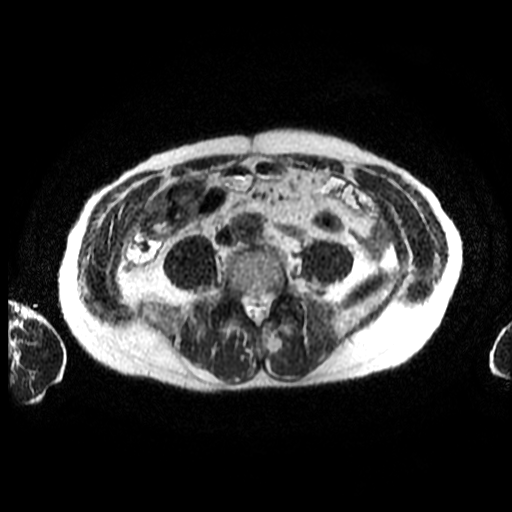

[Series 400: reformatted · axial · 1.6mm · 0.62mm/px · 1 of 117 slices shown (1 of 2)]
[im 1/117]
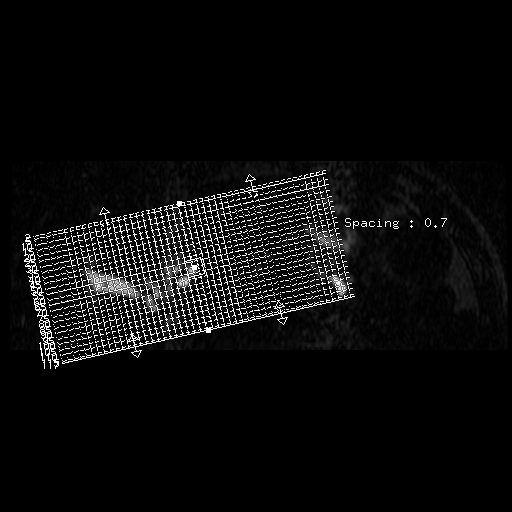

[Series 401: reformatted · axial · 1.6mm · 0.62mm/px · 1 of 148 slices shown (2 of 2)]
[im 1/148]
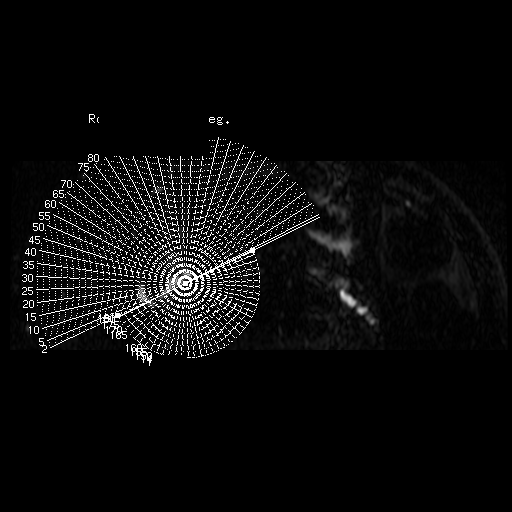

[Series 600: DWI · axial · 6.0mm · 1.48mm/px · 1 of 40 slices shown]
[im 1/40]
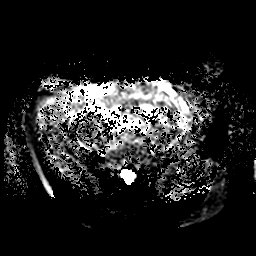

[Series 1100: T1 dynamic · axial · 5.6mm · 0.78mm/px · 1 of 88 slices shown (1 of 5)]
[im 1/88]
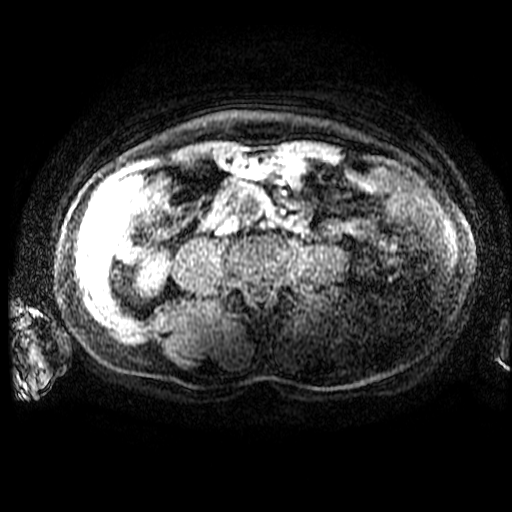

[Series 1101: T1 dynamic · axial · 5.6mm · 0.78mm/px · 1 of 88 slices shown (2 of 5)]
[im 1/88]
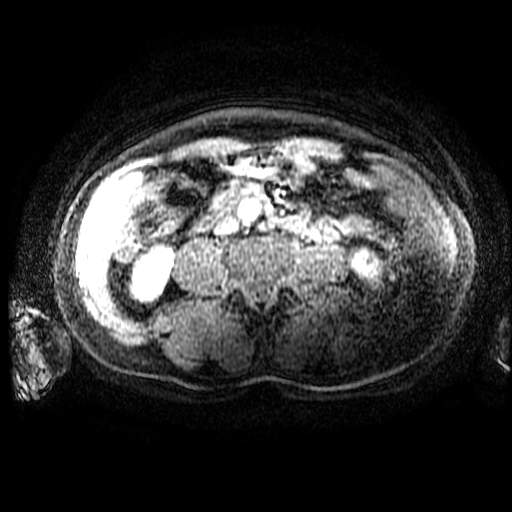

[Series 1103: T1 dynamic · axial · 5.6mm · 0.78mm/px · 1 of 88 slices shown (3 of 5)]
[im 1/88]
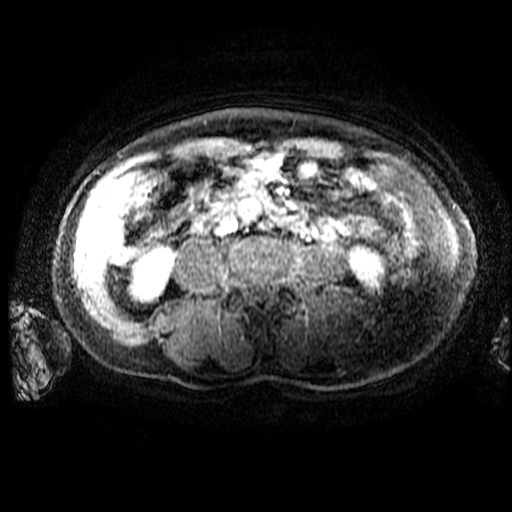

[Series 1104: T1 dynamic · axial · 5.6mm · 0.78mm/px · 1 of 88 slices shown (4 of 5)]
[im 1/88]
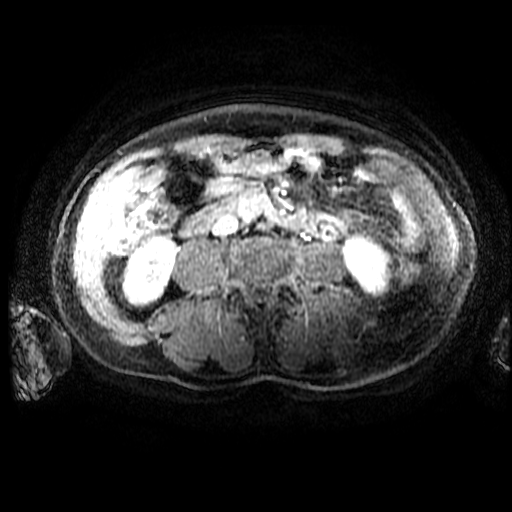

[Series 1105: T1 dynamic · axial · 5.6mm · 0.78mm/px · 1 of 88 slices shown (5 of 5)]
[im 1/88]
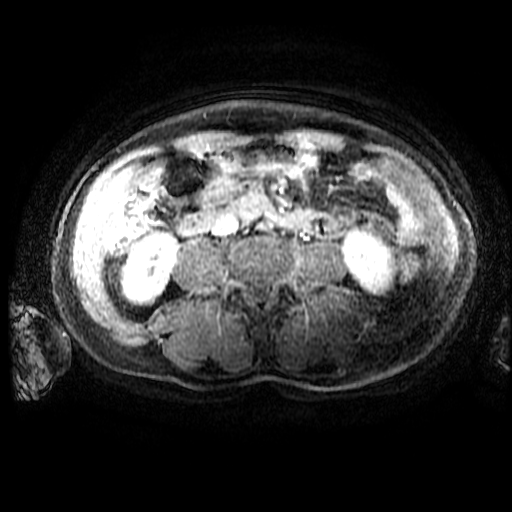

[((id)/(id)/1)-((id)/(id)/1) · axial · 5.6mm · 0.78mm/px · 1 of 88 slices shown (1 of 4)]
[im 1/88]
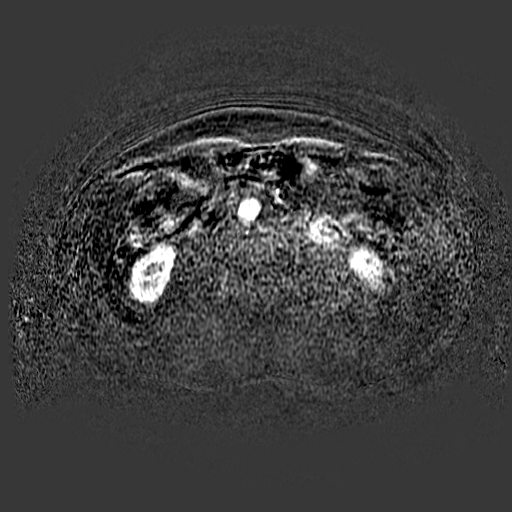

[((id)/(id)/1)-((id)/(id)/1) · axial · 5.6mm · 0.78mm/px · 1 of 88 slices shown (2 of 4)]
[im 1/88]
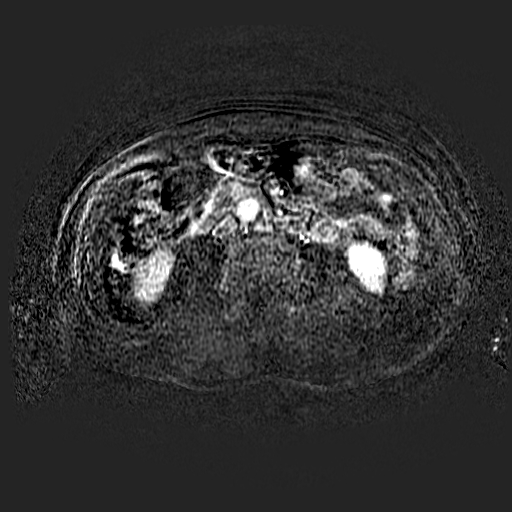

[((id)/(id)/1)-((id)/(id)/1) · axial · 5.6mm · 0.78mm/px · 1 of 88 slices shown (3 of 4)]
[im 1/88]
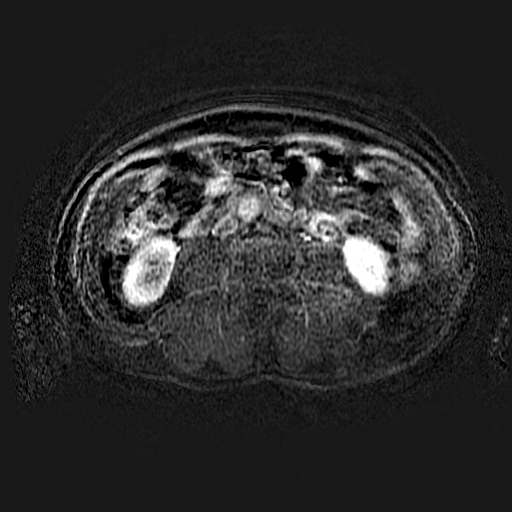

[((id)/(id)/1)-((id)/(id)/1) · axial · 5.6mm · 0.78mm/px · 1 of 88 slices shown (4 of 4)]
[im 1/88]
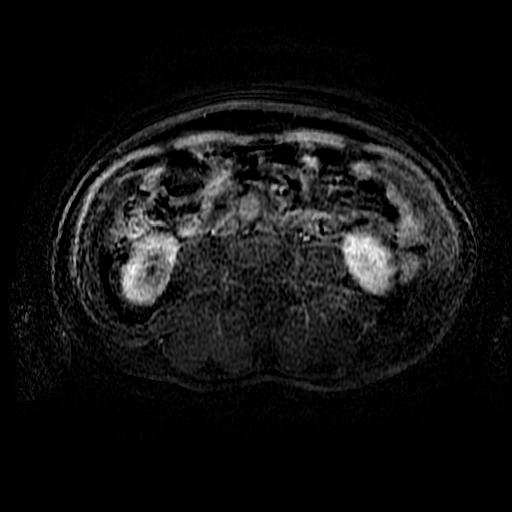

[19 of 23 positions shown; findings below may reference images not displayed]

FINDINGS: Lower chest: Patchy reticulation and fine nodularity at both lung
bases. Dominant subpleural 1.8 cm peripheral basilar right lower
lobe nodule (series 9959/image 29). These findings are unchanged
since 03/29/2016 high-resolution chest CT, where they were
characterized as compatible with pulmonary sarcoidosis. No acute
abnormality at the lung bases. A few mildly enlarged
pericardiophrenic lymph nodes measuring up to 1.1 cm (series
9959/image 29) appears stable since 03/29/2016.

Hepatobiliary: Normal liver size and configuration. No hepatic
steatosis. No liver mass. Normal gallbladder with no cholelithiasis.
No biliary ductal dilatation. Common bile duct diameter 5 mm. No
choledocholithiasis.

Pancreas: There is a 3.7 x 1.9 cm pancreatic body mass (series
7717/image 60), which is hypoenhancing on the arterial phase
sequence and isoenhancing on the delayed sequences, and which
demonstrates restricted diffusion. There is associated segmental
stricture of the main pancreatic duct (series 4/ image 72), with
dilatation of the pancreatic duct in the pancreatic tail up to 7 mm
diameter. The pancreatic body mass abuts the anterior margin of the
portal splenic venous confluence (series 9959/ image 64), without
significant attenuation of the patent portal, SMV or splenic veins.
SMA appears uninvolved by the mass. Accessory left hepatic artery
arising from the left gastric artery. No additional pancreatic mass.
No pancreas divisum.

Spleen: Normal size. No mass.

Adrenals/Urinary Tract: Normal adrenals. No hydronephrosis. Normal
kidneys with no renal mass.

Stomach/Bowel: Grossly normal stomach. Visualized small and large
bowel is normal caliber, with no bowel wall thickening.

Vascular/Lymphatic: Normal caliber abdominal aorta. Patent hepatic,
portal, splenic and renal veins. Retroaortic left renal vein. No
pathologically enlarged lymph nodes in the abdomen.

Other: No abdominal ascites or focal fluid collection.

Musculoskeletal: No aggressive appearing focal osseous lesions.
IMPRESSION: 1. Hypoenhancing 3.7 x 1.9 cm pancreatic body mass. Dilatation of
the main pancreatic duct in the pancreatic tail. Primary pancreatic
adenocarcinoma is the diagnosis of exclusion. Tissue sampling via
EUS recommended.
2. Pancreatic mass abuts the anterior aspect of the portosplenic
venous confluence. Veins remain patent with no significant venous
stricture. No SMA involvement.
3. No evidence of metastatic disease in the abdomen.
4. Stable findings of sarcoidosis at the lung bases.

## 2019-01-20 ENCOUNTER — Other Ambulatory Visit: Payer: Self-pay | Admitting: Urology

## 2019-02-04 ENCOUNTER — Other Ambulatory Visit: Payer: Self-pay | Admitting: Internal Medicine

## 2019-02-04 DIAGNOSIS — Z20822 Contact with and (suspected) exposure to covid-19: Secondary | ICD-10-CM

## 2019-02-07 LAB — NOVEL CORONAVIRUS, NAA: SARS-CoV-2, NAA: NOT DETECTED

## 2019-02-10 ENCOUNTER — Other Ambulatory Visit (HOSPITAL_COMMUNITY)
Admission: RE | Admit: 2019-02-10 | Discharge: 2019-02-10 | Disposition: A | Discharge status: Home / Self Care | Payer: Medicare HMO | Source: Ambulatory Visit | Attending: Urology | Admitting: Urology

## 2019-02-10 DIAGNOSIS — Z20828 Contact with and (suspected) exposure to other viral communicable diseases: Secondary | ICD-10-CM | POA: Insufficient documentation

## 2019-02-10 LAB — SARS CORONAVIRUS 2 (TAT 6-24 HRS): SARS Coronavirus 2: NEGATIVE

## 2019-02-11 NOTE — Progress Notes (Signed)
[facility] 12-11-2018 [facility]   [facility] pulm 07-15-17 epic   cxr 07-15-17 epic

## 2019-02-11 NOTE — Patient Instructions (Addendum)
YOU HAVE COMPLETED YOUR COVID TEST .  PLEASE BEGIN THE QUARANTINE INSTRUCTIONS AS OUTLINED IN YOUR HANDOUT.                [name]   Your procedure is scheduled on: 02-13-2019   Report to [facility]  Entrance     Report to admitting at 11:44AM    1 [place] WAIT IN WAITING ROOM  ONLY DAY OF YOUR SURGERY.    Call this number if you have problems the morning of surgery [phone]      Remember: Do not eat food or drink liquids :After Midnight. BRUSH YOUR TEETH MORNING OF SURGERY AND RINSE YOUR MOUTH OUT, NO CHEWING GUM CANDY OR MINTS.     Take these medicines the morning of surgery with A SIP OF WATER: Prednisone, eye drops                                 You may not have any metal on your body including hair pins and              piercings  Do not wear jewelry, make-up, lotions, powders or perfumes, deodorant                          Men may shave face and neck.   Do not bring valuables to the hospital. [place].  Contacts, dentures or bridgework may not be worn into surgery.      Patients discharged the day of surgery will not be allowed to drive home. IF YOU ARE HAVING SURGERY AND GOING HOME THE SAME DAY, YOU MUST HAVE AN ADULT TO DRIVE YOU HOME AND BE WITH YOU FOR 24 HOURS. YOU MAY GO HOME BY TAXI OR UBER OR ORTHERWISE, BUT AN ADULT MUST ACCOMPANY YOU HOME AND STAY WITH YOU FOR 24 HOURS.  Name and phone number of your driver:  Special Instructions: N/A              Please read over the following fact sheets you were given: _____________________________________________________________________             [facility] - Preparing for Surgery Before surgery, you can play an important role.  Because skin is not sterile, your skin needs to be as free of germs as possible.  You can reduce the number of germs on your skin by washing with CHG (chlorahexidine gluconate) soap before surgery.   CHG is an antiseptic cleaner which kills germs and bonds with the skin to continue killing germs even after washing. Please DO NOT use if you have an allergy to CHG or antibacterial soaps.  If your skin becomes reddened/irritated stop using the CHG and inform your nurse when you arrive at Short Stay. Do not shave (including legs and underarms) for at least 48 hours prior to the first CHG shower.  You may shave your face/neck. Please follow these instructions carefully:  1.  Shower with CHG Soap the night before surgery and the  morning of Surgery.  2.  If you choose to wash your hair, wash your hair first as usual with your  normal  shampoo.  3.  After you shampoo, rinse your hair and body thoroughly to remove the  shampoo.  4.  Use CHG as you would any other liquid soap.  You can apply chg directly  to the skin and wash                       Gently with a scrungie or clean washcloth.  5.  Apply the CHG Soap to your body ONLY FROM THE NECK DOWN.   Do not use on face/ open                           Wound or open sores. Avoid contact with eyes, ears mouth and genitals (private parts).                       Wash face,  Genitals (private parts) with your normal soap.             6.  Wash thoroughly, paying special attention to the area where your surgery  will be performed.  7.  Thoroughly rinse your body with warm water from the neck down.  8.  DO NOT shower/wash with your normal soap after using and rinsing off  the CHG Soap.                9.  Pat yourself dry with a clean towel.            10.  Wear clean pajamas.            11.  Place clean sheets on your bed the night of your first shower and do not  sleep with pets. Day of Surgery : Do not apply any lotions/deodorants the morning of surgery.  Please wear clean clothes to the hospital/surgery center.  FAILURE TO FOLLOW THESE INSTRUCTIONS MAY RESULT IN THE CANCELLATION OF YOUR SURGERY PATIENT  SIGNATURE_________________________________  NURSE SIGNATURE__________________________________  ________________________________________________________________________

## 2019-02-12 ENCOUNTER — Other Ambulatory Visit: Payer: Self-pay

## 2019-02-12 ENCOUNTER — Encounter (HOSPITAL_COMMUNITY): Payer: Self-pay

## 2019-02-12 ENCOUNTER — Encounter (HOSPITAL_COMMUNITY)
Admission: RE | Admit: 2019-02-12 | Discharge: 2019-02-12 | Disposition: A | Discharge status: Home / Self Care | Payer: Medicare HMO | Source: Ambulatory Visit | Attending: Urology | Admitting: Urology

## 2019-02-12 DIAGNOSIS — Z85828 Personal history of other malignant neoplasm of skin: Secondary | ICD-10-CM | POA: Diagnosis not present

## 2019-02-12 DIAGNOSIS — H409 Unspecified glaucoma: Secondary | ICD-10-CM | POA: Diagnosis not present

## 2019-02-12 DIAGNOSIS — R310 Gross hematuria: Secondary | ICD-10-CM | POA: Diagnosis not present

## 2019-02-12 DIAGNOSIS — Z7952 Long term (current) use of systemic steroids: Secondary | ICD-10-CM | POA: Diagnosis not present

## 2019-02-12 DIAGNOSIS — N3289 Other specified disorders of bladder: Secondary | ICD-10-CM | POA: Diagnosis not present

## 2019-02-12 DIAGNOSIS — N323 Diverticulum of bladder: Secondary | ICD-10-CM | POA: Diagnosis not present

## 2019-02-12 HISTORY — DX: Other chronic pancreatitis: K86.1

## 2019-02-12 HISTORY — DX: Hematuria, unspecified: R31.9

## 2019-02-12 HISTORY — DX: Unspecified glaucoma: H40.9

## 2019-02-12 HISTORY — DX: Repeated falls: R29.6

## 2019-02-12 HISTORY — DX: Dyspnea, unspecified: R06.00

## 2019-02-12 HISTORY — DX: Other forms of dyspnea: R06.09

## 2019-02-12 LAB — CBC
HCT: 43.9 % (ref 39.0–52.0)
Hemoglobin: 14.1 g/dL (ref 13.0–17.0)
MCH: 29 pg (ref 26.0–34.0)
MCHC: 32.1 g/dL (ref 30.0–36.0)
MCV: 90.1 fL (ref 80.0–100.0)
Platelets: 213 10*3/uL (ref 150–400)
RBC: 4.87 MIL/uL (ref 4.22–5.81)
RDW: 13.8 % (ref 11.5–15.5)
WBC: 6.2 10*3/uL (ref 4.0–10.5)
nRBC: 0 % (ref 0.0–0.2)

## 2019-02-12 LAB — COMPREHENSIVE METABOLIC PANEL
ALT: 21 U/L (ref 0–44)
AST: 31 U/L (ref 15–41)
Albumin: 3.5 g/dL (ref 3.5–5.0)
Alkaline Phosphatase: 47 U/L (ref 38–126)
Anion gap: 6 (ref 5–15)
BUN: 16 mg/dL (ref 8–23)
CO2: 26 mmol/L (ref 22–32)
Calcium: 9 mg/dL (ref 8.9–10.3)
Chloride: 105 mmol/L (ref 98–111)
Creatinine, Ser: 0.69 mg/dL (ref 0.61–1.24)
GFR calc Af Amer: >60 mL/min (ref >60)
GFR calc non Af Amer: >60 mL/min (ref >60)
Glucose, Bld: 96 mg/dL (ref 70–99)
Potassium: 4 mmol/L (ref 3.5–5.1)
Sodium: 137 mmol/L (ref 135–145)
Total Bilirubin: 1 mg/dL (ref 0.3–1.2)
Total Protein: 9.1 g/dL — ABNORMAL HIGH (ref 6.5–8.1)

## 2019-02-13 ENCOUNTER — Encounter (HOSPITAL_COMMUNITY)
Admission: RE | Disposition: A | Discharge status: Home / Self Care | Payer: Self-pay | Source: Home / Self Care | Attending: Urology

## 2019-02-13 ENCOUNTER — Encounter (HOSPITAL_COMMUNITY): Payer: Self-pay | Admitting: Emergency Medicine

## 2019-02-13 ENCOUNTER — Ambulatory Visit (HOSPITAL_COMMUNITY): Payer: Medicare HMO | Admitting: Physician Assistant

## 2019-02-13 ENCOUNTER — Ambulatory Visit (HOSPITAL_COMMUNITY)
Admission: RE | Admit: 2019-02-13 | Discharge: 2019-02-13 | Disposition: A | Discharge status: Home / Self Care | Payer: Medicare HMO | Attending: Urology | Admitting: Urology

## 2019-02-13 ENCOUNTER — Ambulatory Visit (HOSPITAL_COMMUNITY): Payer: Medicare HMO

## 2019-02-13 ENCOUNTER — Other Ambulatory Visit: Payer: Self-pay

## 2019-02-13 ENCOUNTER — Ambulatory Visit (HOSPITAL_COMMUNITY): Payer: Medicare HMO | Admitting: Certified Registered Nurse Anesthetist

## 2019-02-13 DIAGNOSIS — N3289 Other specified disorders of bladder: Secondary | ICD-10-CM | POA: Diagnosis not present

## 2019-02-13 DIAGNOSIS — R310 Gross hematuria: Secondary | ICD-10-CM | POA: Diagnosis not present

## 2019-02-13 DIAGNOSIS — N323 Diverticulum of bladder: Secondary | ICD-10-CM | POA: Diagnosis not present

## 2019-02-13 DIAGNOSIS — Z85828 Personal history of other malignant neoplasm of skin: Secondary | ICD-10-CM | POA: Insufficient documentation

## 2019-02-13 DIAGNOSIS — Z7952 Long term (current) use of systemic steroids: Secondary | ICD-10-CM | POA: Insufficient documentation

## 2019-02-13 DIAGNOSIS — H409 Unspecified glaucoma: Secondary | ICD-10-CM | POA: Insufficient documentation

## 2019-02-13 HISTORY — PX: CYSTOSCOPY WITH BIOPSY: SHX5122

## 2019-02-13 SURGERY — CYSTOSCOPY, WITH BIOPSY
Anesthesia: General

## 2019-02-13 MED ORDER — EPHEDRINE 5 MG/ML INJ
INTRAVENOUS | Status: AC
  Filled 2019-02-13: qty 10

## 2019-02-13 MED ORDER — OXYCODONE HCL 5 MG/5ML PO SOLN: 5.0000 mg | Freq: Once | ORAL | Status: DC | PRN

## 2019-02-13 MED ORDER — MIDAZOLAM HCL 5 MG/5ML IJ SOLN
INTRAMUSCULAR | Status: DC | PRN
  Administered 2019-02-13: 0.5 mg via INTRAVENOUS

## 2019-02-13 MED ORDER — ONDANSETRON HCL 4 MG/2ML IJ SOLN
INTRAMUSCULAR | Status: AC
  Filled 2019-02-13: qty 2

## 2019-02-13 MED ORDER — FENTANYL CITRATE (PF) 100 MCG/2ML IJ SOLN
INTRAMUSCULAR | Status: AC
  Filled 2019-02-13: qty 2

## 2019-02-13 MED ORDER — SODIUM CHLORIDE 0.9 % IR SOLN
Status: DC | PRN
  Administered 2019-02-13: 3000 mL via INTRAVESICAL

## 2019-02-13 MED ORDER — LIDOCAINE 2% (20 MG/ML) 5 ML SYRINGE
INTRAMUSCULAR | Status: DC | PRN
  Administered 2019-02-13: 60 mg via INTRAVENOUS

## 2019-02-13 MED ORDER — EPHEDRINE SULFATE-NACL 50-0.9 MG/10ML-% IV SOSY
PREFILLED_SYRINGE | INTRAVENOUS | Status: DC | PRN
  Administered 2019-02-13 (×3): 5 mg via INTRAVENOUS

## 2019-02-13 MED ORDER — ONDANSETRON HCL 4 MG/2ML IJ SOLN
INTRAMUSCULAR | Status: DC | PRN
  Administered 2019-02-13: 4 mg via INTRAVENOUS

## 2019-02-13 MED ORDER — DEXAMETHASONE SODIUM PHOSPHATE 10 MG/ML IJ SOLN
INTRAMUSCULAR | Status: AC
  Filled 2019-02-13: qty 1

## 2019-02-13 MED ORDER — LIDOCAINE 2% (20 MG/ML) 5 ML SYRINGE
INTRAMUSCULAR | Status: AC
  Filled 2019-02-13: qty 5

## 2019-02-13 MED ORDER — LIDOCAINE HCL URETHRAL/MUCOSAL 2 % EX GEL
CUTANEOUS | Status: AC
  Filled 2019-02-13: qty 5

## 2019-02-13 MED ORDER — DEXAMETHASONE SODIUM PHOSPHATE 10 MG/ML IJ SOLN
INTRAMUSCULAR | Status: DC | PRN
  Administered 2019-02-13: 10 mg via INTRAVENOUS

## 2019-02-13 MED ORDER — LIDOCAINE HCL URETHRAL/MUCOSAL 2 % EX GEL
CUTANEOUS | Status: DC | PRN
  Administered 2019-02-13: 1 via URETHRAL

## 2019-02-13 MED ORDER — MIDAZOLAM HCL 2 MG/2ML IJ SOLN
INTRAMUSCULAR | Status: AC
  Filled 2019-02-13: qty 2

## 2019-02-13 MED ORDER — SODIUM CHLORIDE 0.9 % IV SOLN
INTRAVENOUS | Status: DC | PRN
  Administered 2019-02-13: 15:00:00 8 mL

## 2019-02-13 MED ORDER — ONDANSETRON HCL 4 MG/2ML IJ SOLN: 4.0000 mg | Freq: Once | INTRAMUSCULAR | Status: DC | PRN

## 2019-02-13 MED ORDER — LACTATED RINGERS IV SOLN
INTRAVENOUS | Status: DC
  Administered 2019-02-13: 12:00:00 via INTRAVENOUS

## 2019-02-13 MED ORDER — STERILE WATER FOR IRRIGATION IR SOLN
Status: DC | PRN
  Administered 2019-02-13: 1

## 2019-02-13 MED ORDER — FENTANYL CITRATE (PF) 100 MCG/2ML IJ SOLN
INTRAMUSCULAR | Status: DC | PRN
  Administered 2019-02-13 (×2): 25 ug via INTRAVENOUS

## 2019-02-13 MED ORDER — CEFAZOLIN SODIUM-DEXTROSE 2-4 GM/100ML-% IV SOLN
2.0000 g | Freq: Once | INTRAVENOUS | Status: AC
  Administered 2019-02-13: 2 g via INTRAVENOUS
  Filled 2019-02-13: qty 100

## 2019-02-13 MED ORDER — PROPOFOL 10 MG/ML IV BOLUS
INTRAVENOUS | Status: DC | PRN
  Administered 2019-02-13: 200 mg via INTRAVENOUS

## 2019-02-13 MED ORDER — FENTANYL CITRATE (PF) 100 MCG/2ML IJ SOLN: 25.0000 ug | INTRAMUSCULAR | Status: DC | PRN

## 2019-02-13 MED ORDER — PROPOFOL 10 MG/ML IV BOLUS
INTRAVENOUS | Status: AC
  Filled 2019-02-13: qty 20

## 2019-02-13 MED ORDER — OXYCODONE HCL 5 MG PO TABS: 5.0000 mg | ORAL_TABLET | Freq: Once | ORAL | Status: DC | PRN

## 2019-02-13 SURGICAL SUPPLY — 14 items
BAG URINE DRAINAGE (UROLOGICAL SUPPLIES) IMPLANT
BAG URO CATCHER STRL LF (MISCELLANEOUS) ×3 IMPLANT
CATH URET 5FR 28IN CONE TIP (BALLOONS) ×2
CATH URET 5FR 70CM CONE TIP (BALLOONS) ×1 IMPLANT
COVER WAND RF STERILE (DRAPES) IMPLANT
GLOVE BIOGEL M STRL SZ7.5 (GLOVE) ×3 IMPLANT
GOWN STRL REUS W/TWL XL LVL3 (GOWN DISPOSABLE) ×3 IMPLANT
KIT TURNOVER KIT A (KITS) IMPLANT
LOOP CUT BIPOLAR 24F LRG (ELECTROSURGICAL) IMPLANT
MANIFOLD NEPTUNE II (INSTRUMENTS) ×3 IMPLANT
PACK CYSTO (CUSTOM PROCEDURE TRAY) ×3 IMPLANT
TUBING CONNECTING 10 (TUBING) ×2 IMPLANT
TUBING CONNECTING 10' (TUBING) ×1
TUBING UROLOGY SET (TUBING) ×3 IMPLANT

## 2019-02-13 NOTE — Transfer of Care (Signed)
Immediate Anesthesia Transfer of Care Note  Patient: [name]  Procedure(s) Performed: CYSTOSCOPY WITH /BLADDER BIOPSY/ FULGURATION (N/A )  Patient Location: PACU  Anesthesia Type:General  Level of Consciousness: awake, alert  and oriented  Airway & Oxygen Therapy: Patient Spontanous Breathing and Patient connected to face mask oxygen  Post-op Assessment: Report given to RN and Post -op Vital signs reviewed and stable  Post vital signs: Reviewed and stable  Last Vitals:  Vitals Value Taken Time  BP 125/87 02/13/19 1457  Temp    Pulse 68 02/13/19 1459  Resp 15 02/13/19 1459  SpO2 100 % 02/13/19 1459  Vitals shown include unvalidated device data.  Last Pain:  Vitals:   02/13/19 1208  TempSrc: Oral  PainSc: 0-No pain      Patients Stated Pain Goal: 4 (80/88/11 0315)  Complications: No apparent anesthesia complications

## 2019-02-13 NOTE — Op Note (Signed)
Preoperative diagnosis: Gross hematuria bladder erythema Postoperative diagnosis: Same  Procedure: Cystoscopy with bladder biopsy and fulguration 0.5 to 2 cm  Surgeon: Junious Silk  Anesthesia: General  Indication for procedure: [name] is a 73 year old African-American male with recurrent gross hematuria.  He is brought today for endoscopic evaluation.  Findings: On cystoscopy the urethra was unremarkable.  The prostatic urethra exhibited lateral lobe hypertrophy but was obstructed by a large median lobe which protruded out over the trigone and ureteral orifice ease.  I was not able to access the ureteral orifice for retrograde because of the J-hook nature.  Clear eflux was noted bilaterally.  There was moderate trabeculation of the bladder with a few cellules and 2 medium size diverticulum.  1 right posterior which had a clean anterior in the left lateral which had recurrent erythematous friable mucosa at the base.  There was no active bleeding noted.  There was no stone or foreign body in the bladder.  There was no other mucosal lesions.  Exam under anesthesia revealed a circumcised penis.  Testicles descended bilaterally and palpably normal.  On digital rectal exam the prostate was about 40 g and smooth without hard area or nodule.  Description of procedure: After consent was obtained patient brought to the operating room.  After adequate anesthesia he is placed in lithotomy position and prepped and draped in usual sterile fashion.  Cystoscope was passed per urethra and the bladder carefully inspected with a 30 degree and 70 degree lens.  I then tried to access the ureteral orifice with a 5 [place] open-ended catheter but could not get contrast to pass in a retrograde fashion into the ureter.  I then tried a cone-tip and got just a wisp of contrast into the ureter and could see that it was severely J-hook.  Despite multiple attempts could not perform retrogrades.  I then passed a 7 [place] flexible  biopsy forceps into the left diverticulum and took 2 biopsies.  These were very small.  The wall of the diverticulum was not giving much.  I then took the larger rigid cold cup and carefully took a very superficial bite of the diverticulum erythematous area.  There was no concern for perforation.  The entire base of the diverticulum and all the erythema was fulgurated with the Bugbee electrode.  Hemostasis was excellent under low pressure.  The bladder was inspected again and noted to have no active bleeding or any other issues.  I left some fluid in the bladder and backed the scope out.  Lidocaine jelly was instilled per urethra and exam under anesthesia was performed.  He was awakened and taken to the recovery room in stable condition.  Complications: None  Blood loss: Minimal  Specimens to pathology: Left bladder wall diverticulum biopsy  Drains: None  Disposition: Patient stable to PACU

## 2019-02-13 NOTE — Anesthesia Preprocedure Evaluation (Addendum)
Anesthesia Evaluation  Patient identified by MRN, date of birth, ID band Patient awake    Reviewed: Allergy & Precautions, NPO status , Patient's Chart, lab work & pertinent test results  History of Anesthesia Complications Negative for: history of anesthetic complications  Airway Mallampati: II  TM Distance: >3 FB Neck ROM: Full    Dental  (+) Missing, Poor Dentition   Pulmonary  sarcoidosis   Pulmonary exam normal        Cardiovascular negative cardio ROS Normal cardiovascular exam     Neuro/Psych negative neurological ROS  negative psych ROS   GI/Hepatic negative GI ROS, (+) Hepatitis -, C  Endo/Other  negative endocrine ROS  Renal/GU negative Renal ROS  negative genitourinary   Musculoskeletal negative musculoskeletal ROS (+)   Abdominal   Peds  Hematology negative hematology ROS (+)   Anesthesia Other Findings   Reproductive/Obstetrics                            Anesthesia Physical Anesthesia Plan  ASA: III  Anesthesia Plan: General   Post-op Pain Management:    Induction: Intravenous  PONV Risk Score and Plan: 2 and Ondansetron, Dexamethasone and Treatment may vary due to age or medical condition  Airway Management Planned: LMA  Additional Equipment: None  Intra-op Plan:   Post-operative Plan: Extubation in OR  Informed Consent: I have reviewed the patients History and Physical, chart, labs and discussed the procedure including the risks, benefits and alternatives for the proposed anesthesia with the patient or authorized representative who has indicated his/her understanding and acceptance.     Dental advisory given  Plan Discussed with:   Anesthesia Plan Comments:        Anesthesia Quick Evaluation

## 2019-02-13 NOTE — H&P (Signed)
H&P  Chief Complaint: Gross hematuria, BPH, bladder diverticulum  History of Present Illness: [name] is a 73 year old male with a history of BPH, gross hematuria and bladder diverticulum.  I took him in June 2018 for cystoscopy and biopsy in 2018 due to gross hematuria.  There was erythema of the left bladder wall diverticulum which was benign.  He had an obstructing median lobe.  He recently had gross hematuria again and office cystoscopy on 12/30/2018 was normal.  Hematuria recurred and a urine culture was negative he continued to have another episode of gross hematuria earlier this month and was brought today for repeat cystoscopic evaluation and exam under anesthesia.  In fact last night he voided and noted red urine.  He had no clots.  Otherwise he is well.  Past Medical History:  Diagnosis Date  . 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 Bladder diverticulum 12/17/2016  . Bladder diverticulum   . DOE (dyspnea on exertion)    attributes to sarcoidosis   . Frequent falls    per patient , he falls frequently and unexpectedly , he states a chiropractor told him it was because lowere back problems cause weakness in his legs.  patient denies accompanying loc or dizzines prior fall,  . Glaucoma    "30% of my vision is gone, i ttake 4 different types of eye drops "  . Hematuria   . History of GI bleed    upper GI bleed 07-20-2001  per EGD bleeding coming from pyloic channel/  09-02-2010 upper GI bleed -- per EGD erosive mucosa esophagastric region   . History of positive hepatitis C dx 06-03-2014   Genotype 1A (F0 to F1)  completed harvoni treatment 03/ 2016 and nondetectalbe  . History of squamous cell carcinoma excision    09-16-2007  re-excision lesion back / neck area  ,  low grade  . Pancreatic mass 12/12/2016   found on CT for hematuria work up  . Pulmonary sarcoidosis [facility]) pulmologist-  dr clance/ dr  Arlana Pouch   dx age 36/  confirmed dx via endobronchial bx RLL 09-15-2013   Past Surgical History:  Procedure Laterality Date  . CYSTOSCOPY WITH BIOPSY N/A 01/04/2017   Procedure: CYSTOSCOPY WITH BIOPSY AND FULGURATION;  Surgeon: Festus Aloe, MD;  Location: [facility];  Service: Urology;  Laterality: N/A;  . ESOPHAGOGASTRODUODENOSCOPY  last one 09-05-2010  . HERNIA REPAIR  1982  . RE-EXCISION LESION BACK/ NECK AREA  09/16/2007  . VIDEO BRONCHOSCOPY Bilateral 09/15/2013   Procedure: VIDEO BRONCHOSCOPY WITH FLUORO;  Surgeon: Kathee Delton, MD;  Location: [facility];  Service: Cardiopulmonary;  Laterality: Bilateral;  . VIDEO BRONCHOSCOPY Bilateral 12/16/2013   Procedure: VIDEO BRONCHOSCOPY WITHOUT FLUORO;  Surgeon: Kathee Delton, MD;  Location: [facility];  Service: Cardiopulmonary;  Laterality: Bilateral;    Home Medications:  Medications Prior to Admission  Medication Sig Dispense Refill Last Dose  . brimonidine (ALPHAGAN) 0.2 % ophthalmic solution Place 1 drop into the right eye 2 (two) times a day.   02/12/2019 at Unknown time  . brinzolamide (AZOPT) 1 % ophthalmic suspension Place 1 drop into the right eye 2 (two) times a day.   02/12/2019 at Unknown time  . latanoprost (XALATAN) 0.005 % ophthalmic solution Place 1 drop into both eyes at bedtime. As directed   02/12/2019 at Unknown time  . predniSONE (DELTASONE) 1 MG tablet Take 4 mg by mouth daily.  2 02/12/2019 at Unknown time  . timolol (TIMOPTIC) 0.5 % ophthalmic solution Place 1 drop into both eyes 2 (two) times daily.   3 02/13/2019 at Unknown time  . ACCU-CHEK AVIVA PLUS test strip    Unknown at Unknown time  . ACCU-CHEK SOFTCLIX LANCETS lancets    Unknown at Unknown time  . Blood Glucose Monitoring Suppl (ACCU-CHEK AVIVA PLUS) w/Device KIT    Unknown at Unknown time  . cyclobenzaprine (FLEXERIL) 10 MG tablet Take 1 tablet (10 mg total) by mouth 3 (three) times daily. (Patient not taking: Reported on  04/21/2018) 20 tablet 0    Allergies:  Allergies  Allergen Reactions  . Penicillins Other (See Comments)    Unknown reaction 30+years ago  Has patient had a PCN reaction causing immediate rash, facial/tongue/throat swelling, SOB or lightheadedness with hypotension: Unknown Has patient had a PCN reaction causing severe rash involving mucus membranes or skin necrosis: Unknown Has patient had a PCN reaction that required hospitalization: Unknown Has patient had a PCN reaction occurring within the last 10 years: No If all of the above answers are "NO", then may proceed with Cephalosporin use.     Family History  Problem Relation Age of Onset  . Hypertension Mother   . Heart failure Father   . Cancer - Prostate Brother    Social History:  reports that he has never smoked. He has never used smokeless tobacco. He reports that he does not drink alcohol or use drugs.  ROS: A complete review of systems was performed.  All systems are negative except for pertinent findings as noted. Review of Systems  All other systems reviewed and are negative.    Physical Exam:  Vital signs in last 24 hours: Temp:  [98.7 F (37.1 C)] 98.7 F (37.1 C) (07/31 1208) Pulse Rate:  [66] 66 (07/31 1156) Resp:  [18] 18 (07/31 1156) BP: (148)/(99) 148/99 (07/31 1156) SpO2:  [100 %] 100 % (07/31 1156) Weight:  [82.1 kg] 82.1 kg (07/31 1224) General:  Alert and oriented, No acute distress HEENT: Normocephalic, atraumatic Cardiovascular: Regular rate and rhythm Lungs: Regular rate and effort Abdomen: Soft, nontender, nondistended, no abdominal masses Back: No CVA tenderness Extremities: No edema Neurologic: Grossly intact  Laboratory Data:  Results for orders placed or performed during the hospital encounter of 02/12/19 (from the past 24 hour(s))  Comprehensive metabolic panel     Status: Abnormal   Collection Time: 02/12/19  3:05 PM  Result Value Ref Range   Sodium 137 135 - 145 mmol/L   Potassium  4.0 3.5 - 5.1 mmol/L   Chloride 105 98 - 111 mmol/L   CO2 26 22 - 32 mmol/L   Glucose, Bld 96 70 - 99 mg/dL   BUN 16 8 - 23 mg/dL   Creatinine, Ser 0.69 0.61 - 1.24 mg/dL   Calcium 9.0 8.9 - 10.3 mg/dL   Total Protein 9.1 (H) 6.5 - 8.1 g/dL   Albumin 3.5 3.5 - 5.0 g/dL   AST 31 15 - 41 U/L   ALT 21 0 - 44 U/L   Alkaline Phosphatase 47 38 - 126 U/L   Total Bilirubin 1.0 0.3 - 1.2 mg/dL   GFR calc non Af Amer >60 >60 mL/min   GFR calc Af Amer >60 >60 mL/min   Anion gap 6 5 - 15  CBC     Status: None   Collection Time: 02/12/19  3:05 PM  Result Value Ref Range   WBC 6.2 4.0 - 10.5  K/uL   RBC 4.87 4.22 - 5.81 MIL/uL   Hemoglobin 14.1 13.0 - 17.0 g/dL   HCT 43.9 39.0 - 52.0 %   MCV 90.1 80.0 - 100.0 fL   MCH 29.0 26.0 - 34.0 pg   MCHC 32.1 30.0 - 36.0 g/dL   RDW 13.8 11.5 - 15.5 %   Platelets 213 150 - 400 K/uL   nRBC 0.0 0.0 - 0.2 %   Recent Results (from the past 240 hour(s))  Novel Coronavirus, NAA ([facility])     Status: None   Collection Time: 02/04/19 12:00 AM  Result Value Ref Range Status   SARS-CoV-2, NAA Not Detected Not Detected Final    Comment: Testing was performed using the cobas(R) SARS-CoV-2 test. This test was developed and its performance characteristics determined by [employer]. This test has not been FDA cleared or approved. This test has been authorized by FDA under an Emergency Use Authorization (EUA). This test is only authorized for the duration of time the declaration that circumstances exist justifying the authorization of the emergency use of in vitro diagnostic tests for detection of SARS-CoV-2 virus and/or diagnosis of COVID-19 infection under section 564(b)(1) of the Act, 21 U.S.C. 062BJS-2(G)(3), unless the authorization is terminated or revoked sooner. When diagnostic testing is negative, the possibility of a false negative result should be considered in the context of a patient's recent exposures and the presence of clinical signs  and symptoms consistent with COVID-19. An individual without symptoms of COVID-19 and who is not shedding SARS-CoV-2 virus would expect to have a negati ve (not detected) result in this assay.   SARS Coronavirus 2 (Performed in [place] hospital lab)     Status: None   Collection Time: 02/10/19 12:13 PM   Specimen: Nasal Swab  Result Value Ref Range Status   SARS Coronavirus 2 NEGATIVE NEGATIVE Final    Comment: (NOTE) SARS-CoV-2 target nucleic acids are NOT DETECTED. The SARS-CoV-2 RNA is generally detectable in upper and lower respiratory specimens during the acute phase of infection. Negative results do not preclude SARS-CoV-2 infection, do not rule out co-infections with other pathogens, and should not be used as the sole basis for treatment or other patient management decisions. Negative results must be combined with clinical observations, patient history, and epidemiological information. The expected result is Negative. Fact Sheet for Patients: SugarRoll.be Fact Sheet for Healthcare Providers: https://www.woods-mathews.com/ This test is not yet approved or cleared by the [place] FDA and  has been authorized for detection and/or diagnosis of SARS-CoV-2 by FDA under an Emergency Use Authorization (EUA). This EUA will remain  in effect (meaning this test can be used) for the duration of the COVID-19 declaration under Section 56 4(b)(1) of the Act, 21 U.S.C. section 360bbb-3(b)(1), unless the authorization is terminated or revoked sooner. Performed at [facility], [place] [address], [place], [place] [address]    Creatinine: Recent Labs    02/12/19 1505  CREATININE 0.69    Impression/Assessment/plan:  I discussed with the patient the nature, potential benefits, risks and alternatives to Cystoscopy with bilateral retrograde pyelogram and possible bladder biopsy, TURBT or TURP, including side effects of the proposed  treatment, the likelihood of the patient achieving the goals of the procedure, and any potential problems that might occur during the procedure or recuperation. All questions answered. Patient elects to proceed.    Festus Aloe 02/13/2019, 1:33 PM

## 2019-02-13 NOTE — Anesthesia Postprocedure Evaluation (Signed)
Anesthesia Post Note  Patient: [name]  Procedure(s) Performed: CYSTOSCOPY WITH /BLADDER BIOPSY/ FULGURATION (N/A )     Patient location during evaluation: PACU Anesthesia Type: General Level of consciousness: awake and alert Pain management: pain level controlled Vital Signs Assessment: post-procedure vital signs reviewed and stable Respiratory status: spontaneous breathing, nonlabored ventilation and respiratory function stable Cardiovascular status: blood pressure returned to baseline and stable Postop Assessment: no apparent nausea or vomiting Anesthetic complications: no    Last Vitals:  Vitals:   02/13/19 1545 02/13/19 1616  BP: (!) 140/96 132/85  Pulse: (!) 54 (!) 56  Resp: 16 15  Temp: 36.4 C (!) 36.4 C  SpO2: 95% 97%    Last Pain:  Vitals:   02/13/19 1616  TempSrc:   PainSc: 0-No pain                 Lidia Collum

## 2019-02-13 NOTE — Discharge Instructions (Signed)
Bladder Biopsy, Care After Refer to this sheet in the next few weeks. These instructions provide you with information about caring for yourself after your procedure. Your health care provider may also give you more specific instructions. Your treatment has been planned according to current medical practices, but problems sometimes occur. Call your health care provider if you have any problems or questions after your procedure. What can I expect after the procedure? After the procedure, it is common to have:  Mild pain in your bladder or kidney area during urination.  Minor burning during urination.  Small amounts of blood in your urine.  A sudden urge to urinate.  A need to urinate more often than usual. Follow these instructions at home: Medicines  Take over-the-counter and prescription medicines only as told by your health care provider.  If you were prescribed an antibiotic medicine, take it as told by your health care provider. Do not stop taking the antibiotic even if you start to feel better. General instructions   Take a warm bath to relieve any burning sensations around your urethra.  Hold a warm, damp washcloth over the urethral area to ease pain.  Return to your normal activities as told by your health care provider. Ask your health care provider what activities are safe for you.  Do not drive for 24 hours if you received a medicine to help you relax (sedative) during your procedure. Ask your health care provider when it is safe for you to drive.  It is your responsibility to get the results of your procedure. Ask your health care provider or the department performing the procedure when your results will be ready.  Keep all follow-up visits as told by your health care provider. This is important. Contact a health care provider if:  You have a fever.  Your symptoms do not improve within 24 hours and you continue to have: ? Burning during urination. ? Increasing  amounts of blood in your urine. ? Pain during urination. ? An urgent need to urinate. ? A need to urinate more often than usual. Get help right away if:  You have a lot of bleeding or more bleeding.  You have severe pain.  You are unable to urinate.  You have bright red blood in your urine.  You are passing blood clots in your urine.  You have a fever.  You have swelling, redness, or pain in your legs.  You have difficulty breathing. This information is not intended to replace advice given to you by your health care provider. Make sure you discuss any questions you have with your health care provider. Document Released: 07/19/2015 Document Revised: 12/08/2015 Document Reviewed: 07/19/2015 Elsevier Patient Education  2020 [place] Anesthesia, Adult, Care After This sheet gives you information about how to care for yourself after your procedure. Your health care provider may also give you more specific instructions. If you have problems or questions, contact your health care provider. What can I expect after the procedure? After the procedure, the following side effects are common:  Pain or discomfort at the IV site.  Nausea.  Vomiting.  Sore throat.  Trouble concentrating.  Feeling cold or chills.  Weak or tired.  Sleepiness and fatigue.  Soreness and body aches. These side effects can affect parts of the body that were not involved in surgery. Follow these instructions at home:  For at least 24 hours after the procedure:  Have a responsible adult stay with you. It is  important to have someone help care for you until you are awake and alert.  Rest as needed.  Do not: ? Participate in activities in which you could fall or become injured. ? Drive. ? Use heavy machinery. ? Drink alcohol. ? Take sleeping pills or medicines that cause drowsiness. ? Make important decisions or sign legal documents. ? Take care of children on your own. Eating  and drinking  Follow any instructions from your health care provider about eating or drinking restrictions.  When you feel hungry, start by eating small amounts of foods that are soft and easy to digest (bland), such as toast. Gradually return to your regular diet.  Drink enough fluid to keep your urine pale yellow.  If you vomit, rehydrate by drinking water, juice, or clear broth. General instructions  If you have sleep apnea, surgery and certain medicines can increase your risk for breathing problems. Follow instructions from your health care provider about wearing your sleep device: ? Anytime you are sleeping, including during daytime naps. ? While taking prescription pain medicines, sleeping medicines, or medicines that make you drowsy.  Return to your normal activities as told by your health care provider. Ask your health care provider what activities are safe for you.  Take over-the-counter and prescription medicines only as told by your health care provider.  If you smoke, do not smoke without supervision.  Keep all follow-up visits as told by your health care provider. This is important. Contact a health care provider if:  You have nausea or vomiting that does not get better with medicine.  You cannot eat or drink without vomiting.  You have pain that does not get better with medicine.  You are unable to pass urine.  You develop a skin rash.  You have a fever.  You have redness around your IV site that gets worse. Get help right away if:  You have difficulty breathing.  You have chest pain.  You have blood in your urine or stool, or you vomit blood. Summary  After the procedure, it is common to have a sore throat or nausea. It is also common to feel tired.  Have a responsible adult stay with you for the first 24 hours after general anesthesia. It is important to have someone help care for you until you are awake and alert.  When you feel hungry, start by  eating small amounts of foods that are soft and easy to digest (bland), such as toast. Gradually return to your regular diet.  Drink enough fluid to keep your urine pale yellow.  Return to your normal activities as told by your health care provider. Ask your health care provider what activities are safe for you. This information is not intended to replace advice given to you by your health care provider. Make sure you discuss any questions you have with your health care provider. Document Released: 10/08/2000 Document Revised: 07/05/2017 Document Reviewed: 02/15/2017 Elsevier Patient Education  2020 [employer].

## 2019-02-13 NOTE — Anesthesia Procedure Notes (Signed)
Procedure Name: LMA Insertion Date/Time: 02/13/2019 2:10 PM Performed by: Maxwell Caul, CRNA Pre-anesthesia Checklist: Patient identified, Emergency Drugs available, Suction available and Patient being monitored Patient Re-evaluated:Patient Re-evaluated prior to induction Oxygen Delivery Method: Circle system utilized Preoxygenation: Pre-oxygenation with 100% oxygen Induction Type: IV induction LMA: LMA inserted LMA Size: 4.0 Number of attempts: 1 Placement Confirmation: positive ETCO2 and breath sounds checked- equal and bilateral Tube secured with: Tape Dental Injury: Teeth and Oropharynx as per pre-operative assessment

## 2019-02-14 ENCOUNTER — Encounter (HOSPITAL_COMMUNITY): Payer: Self-pay | Admitting: Urology

## 2021-10-25 ENCOUNTER — Emergency Department (HOSPITAL_COMMUNITY): Payer: Medicare HMO

## 2021-10-25 ENCOUNTER — Encounter (HOSPITAL_COMMUNITY): Payer: Self-pay

## 2021-10-25 ENCOUNTER — Emergency Department (HOSPITAL_COMMUNITY)
Admission: EM | Admit: 2021-10-25 | Discharge: 2021-10-25 | Disposition: A | Discharge status: Home / Self Care | Payer: Medicare HMO | Attending: Emergency Medicine | Admitting: Emergency Medicine

## 2021-10-25 ENCOUNTER — Other Ambulatory Visit: Payer: Self-pay

## 2021-10-25 DIAGNOSIS — Z79899 Other long term (current) drug therapy: Secondary | ICD-10-CM | POA: Insufficient documentation

## 2021-10-25 DIAGNOSIS — M25562 Pain in left knee: Secondary | ICD-10-CM

## 2021-10-25 DIAGNOSIS — R590 Localized enlarged lymph nodes: Secondary | ICD-10-CM | POA: Diagnosis not present

## 2021-10-25 DIAGNOSIS — S8992XA Unspecified injury of left lower leg, initial encounter: Secondary | ICD-10-CM | POA: Diagnosis present

## 2021-10-25 DIAGNOSIS — M25552 Pain in left hip: Secondary | ICD-10-CM | POA: Diagnosis not present

## 2021-10-25 DIAGNOSIS — R410 Disorientation, unspecified: Secondary | ICD-10-CM | POA: Insufficient documentation

## 2021-10-25 DIAGNOSIS — S79912A Unspecified injury of left hip, initial encounter: Secondary | ICD-10-CM | POA: Diagnosis not present

## 2021-10-25 DIAGNOSIS — Y92410 Unspecified street and highway as the place of occurrence of the external cause: Secondary | ICD-10-CM | POA: Insufficient documentation

## 2021-10-25 LAB — SAMPLE TO BLOOD BANK

## 2021-10-25 LAB — ETHANOL: Alcohol, Ethyl (B): <10 mg/dL (ref <10)

## 2021-10-25 LAB — COMPREHENSIVE METABOLIC PANEL
ALT: 24 U/L (ref 0–44)
AST: 45 U/L — ABNORMAL HIGH (ref 15–41)
Albumin: 3.6 g/dL (ref 3.5–5.0)
Alkaline Phosphatase: 44 U/L (ref 38–126)
Anion gap: 6 (ref 5–15)
BUN: 13 mg/dL (ref 8–23)
CO2: 23 mmol/L (ref 22–32)
Calcium: 9.2 mg/dL (ref 8.9–10.3)
Chloride: 110 mmol/L (ref 98–111)
Creatinine, Ser: 0.61 mg/dL (ref 0.61–1.24)
GFR, Estimated: >60 mL/min (ref >60)
Glucose, Bld: 105 mg/dL — ABNORMAL HIGH (ref 70–99)
Potassium: 3.6 mmol/L (ref 3.5–5.1)
Sodium: 139 mmol/L (ref 135–145)
Total Bilirubin: 1 mg/dL (ref 0.3–1.2)
Total Protein: 9.1 g/dL — ABNORMAL HIGH (ref 6.5–8.1)

## 2021-10-25 LAB — CBC
HCT: 41.4 % (ref 39.0–52.0)
Hemoglobin: 14.2 g/dL (ref 13.0–17.0)
MCH: 28.7 pg (ref 26.0–34.0)
MCHC: 34.3 g/dL (ref 30.0–36.0)
MCV: 83.6 fL (ref 80.0–100.0)
Platelets: 230 10*3/uL (ref 150–400)
RBC: 4.95 MIL/uL (ref 4.22–5.81)
RDW: 13.8 % (ref 11.5–15.5)
WBC: 4.5 10*3/uL (ref 4.0–10.5)
nRBC: 0 % (ref 0.0–0.2)

## 2021-10-25 MED ORDER — METHOCARBAMOL 500 MG PO TABS: 500.0000 mg | ORAL_TABLET | Freq: Two times a day (BID) | ORAL | 0 refills | Status: DC

## 2021-10-25 MED ORDER — NAPROXEN 500 MG PO TABS: 500.0000 mg | ORAL_TABLET | Freq: Two times a day (BID) | ORAL | 0 refills | Status: DC

## 2021-10-25 MED ORDER — MORPHINE SULFATE (PF) 2 MG/ML IV SOLN
2.0000 mg | Freq: Once | INTRAVENOUS | Status: AC
  Administered 2021-10-25: 2 mg via INTRAVENOUS
  Filled 2021-10-25: qty 1

## 2021-10-25 MED ORDER — IOHEXOL 300 MG/ML  SOLN
100.0000 mL | Freq: Once | INTRAMUSCULAR | Status: AC | PRN
  Administered 2021-10-25: 100 mL via INTRAVENOUS
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
## 2022-02-01 ENCOUNTER — Encounter (HOSPITAL_COMMUNITY): Payer: Self-pay

## 2022-02-01 ENCOUNTER — Other Ambulatory Visit: Payer: Self-pay

## 2022-02-01 ENCOUNTER — Emergency Department (HOSPITAL_COMMUNITY)
Admission: EM | Admit: 2022-02-01 | Discharge: 2022-02-01 | Disposition: A | Discharge status: Home / Self Care | Payer: Medicare HMO | Attending: Student | Admitting: Student

## 2022-02-01 DIAGNOSIS — S0096XA Insect bite (nonvenomous) of unspecified part of head, initial encounter: Secondary | ICD-10-CM | POA: Diagnosis not present

## 2022-02-01 DIAGNOSIS — S30860A Insect bite (nonvenomous) of lower back and pelvis, initial encounter: Secondary | ICD-10-CM | POA: Diagnosis not present

## 2022-02-01 DIAGNOSIS — W57XXXA Bitten or stung by nonvenomous insect and other nonvenomous arthropods, initial encounter: Secondary | ICD-10-CM | POA: Diagnosis not present

## 2022-02-01 DIAGNOSIS — S4991XA Unspecified injury of right shoulder and upper arm, initial encounter: Secondary | ICD-10-CM | POA: Diagnosis present

## 2022-02-01 DIAGNOSIS — S40861A Insect bite (nonvenomous) of right upper arm, initial encounter: Secondary | ICD-10-CM | POA: Diagnosis not present

## 2022-02-01 DIAGNOSIS — S40862A Insect bite (nonvenomous) of left upper arm, initial encounter: Secondary | ICD-10-CM | POA: Insufficient documentation

## 2022-02-01 DIAGNOSIS — S1086XA Insect bite of other specified part of neck, initial encounter: Secondary | ICD-10-CM | POA: Insufficient documentation

## 2022-02-01 DIAGNOSIS — T7840XA Allergy, unspecified, initial encounter: Secondary | ICD-10-CM | POA: Insufficient documentation

## 2022-02-01 MED ORDER — DEXAMETHASONE SODIUM PHOSPHATE 10 MG/ML IJ SOLN
10.0000 mg | Freq: Once | INTRAMUSCULAR | Status: AC
  Administered 2022-02-01: 10 mg via INTRAMUSCULAR

## 2022-02-01 MED ORDER — DEXAMETHASONE SODIUM PHOSPHATE 10 MG/ML IJ SOLN
10.0000 mg | Freq: Once | INTRAMUSCULAR | Status: DC
  Filled 2022-02-01: qty 1

## 2022-02-01 MED ORDER — EPINEPHRINE 0.3 MG/0.3ML IJ SOAJ: 0.3000 mg | INTRAMUSCULAR | 0 refills | Status: DC | PRN

## 2022-02-01 MED ORDER — DIPHENHYDRAMINE HCL 50 MG/ML IJ SOLN
50.0000 mg | Freq: Once | INTRAMUSCULAR | Status: AC
  Administered 2022-02-01: 50 mg via INTRAMUSCULAR
  Filled 2022-02-01: qty 1

## 2022-02-01 NOTE — ED Provider Notes (Signed)
[facility] DEPT Provider Note   CSN: [id number] Arrival date & time: 02/01/22  0545     History  Chief Complaint  Patient presents with   Insect Bite    [name] is a 76 y.o. male.  Patient presents to the hospital complaining of multiple yellowjacket stings that occurred yesterday afternoon.  Patient states that he had stings on his arms, back, neck, and face.  EMS came to the scene last night and recommended the patient use ice and Benadryl cream.  The patient states he used it overnight but that he continues to feel pain and has some swelling to his lower lip.  The patient endorses angioedema, urticaria, itchiness.  Patient denies respiratory distress, shortness of breath, abdominal pain, nausea, vomiting, diarrhea, chest pain.  Past medical history is significant for pulmonary sarcoidosis, dyspnea on exertion  HPI     Home Medications Prior to Admission medications   Medication Sig Start Date End Date Taking? Authorizing Provider  EPINEPHrine 0.3 mg/0.3 mL IJ SOAJ injection Inject 0.3 mg into the muscle as needed for anaphylaxis. 02/01/22  Yes Dorothyann Peng, PA-C  ACCU-CHEK AVIVA PLUS test strip  05/24/17   [provider]  ACCU-CHEK SOFTCLIX LANCETS lancets  05/24/17   [provider]  Blood Glucose Monitoring Suppl (ACCU-CHEK AVIVA PLUS) w/Device KIT  05/24/17   [provider]  brimonidine (ALPHAGAN) 0.2 % ophthalmic solution Place 1 drop into the right eye 2 (two) times a day.    [provider]  brinzolamide (AZOPT) 1 % ophthalmic suspension Place 1 drop into the right eye 2 (two) times a day.    [provider]  cyclobenzaprine (FLEXERIL) 10 MG tablet Take 1 tablet (10 mg total) by mouth 3 (three) times daily. Patient not taking: Reported on 04/21/2018 10/08/17   Vanessa Kick, MD  latanoprost (XALATAN) 0.005 % ophthalmic solution Place 1 drop into both eyes at bedtime. As directed 06/19/14    [provider]  methocarbamol (ROBAXIN) 500 MG tablet Take 1 tablet (500 mg total) by mouth 2 (two) times daily. 10/25/21   Alroy Bailiff, Margaux, PA-C  naproxen (NAPROSYN) 500 MG tablet Take 1 tablet (500 mg total) by mouth 2 (two) times daily. 10/25/21   Alroy Bailiff, Margaux, PA-C  predniSONE (DELTASONE) 1 MG tablet Take 4 mg by mouth daily.  04/14/18   [provider]  timolol (TIMOPTIC) 0.5 % ophthalmic solution Place 1 drop into both eyes 2 (two) times daily.  05/19/17   [provider]      Allergies    Penicillins    Review of Systems   Review of Systems  HENT:  Positive for facial swelling.   Respiratory:  Negative for shortness of breath, wheezing and stridor.   Skin:  Positive for rash.    Physical Exam Updated Vital Signs BP (!) 140/93 (BP Location: Left Arm)   Pulse 79   Temp 98.5 F (36.9 C) (Oral)   Resp 20   SpO2 99%  Physical Exam Vitals and nursing note reviewed.  Constitutional:      General: He is not in acute distress. HENT:     Head: Normocephalic and atraumatic.     Mouth/Throat:     Mouth: Mucous membranes are moist.     Pharynx: Oropharynx is clear.     Comments: Bottom lip is swollen Eyes:     Conjunctiva/sclera: Conjunctivae normal.  Cardiovascular:     Rate and Rhythm: Normal rate and regular rhythm.  Pulses: Normal pulses.     Heart sounds: Normal heart sounds.  Pulmonary:     Effort: Pulmonary effort is normal.     Breath sounds: Normal breath sounds. No stridor. No wheezing.  Abdominal:     Palpations: Abdomen is soft.     Tenderness: There is no abdominal tenderness.  Musculoskeletal:        General: Normal range of motion.     Cervical back: Normal range of motion and neck supple.  Skin:    General: Skin is warm and dry.     Capillary Refill: Capillary refill takes less than 2 seconds.     Comments: Insect stings/bites noted on bilateral arms, back, neck.   Neurological:     Mental Status: He is alert and oriented  to person, place, and time.     ED Results / Procedures / Treatments   Labs (all labs ordered are listed, but only abnormal results are displayed) Labs Reviewed - No data to display  EKG None  Radiology No results found.  Procedures Procedures    Medications Ordered in ED Medications  diphenhydrAMINE (BENADRYL) injection 50 mg (50 mg Intramuscular Given 02/01/22 0657)  dexamethasone (DECADRON) injection 10 mg (10 mg Intramuscular Given 02/01/22 7673)    ED Course/ Medical Decision Making/ A&P                           Medical Decision Making Risk Prescription drug management.   The patient presents with an apparent allergic reaction to yellowjacket stings from yesterday.  The patient is in no respiratory distress upon arrival and he has an open airway.  I ordered the patient 50 mg of Benadryl and Decadron intramuscularly for allergic reaction.  Upon reassessment the patient had improved, felt less "itchy", and the lip swelling had begun to subside  The patient's lungs are clear to auscultation and I see no indication for imaging at this time.  I see no benefit of lab testing at this time.  There is no indication at this time for admission.  The patient has improved.  I will prescribe the patient an EpiPen and discharge him home.  He may also use over-the-counter Benadryl. He may follow-up with his primary care provider as needed.  Return precautions provided for any shortness of breath or airway symptoms        Final Clinical Impression(s) / ED Diagnoses Final diagnoses:  Insect bite, unspecified site, initial encounter  Allergic reaction, initial encounter    Rx / DC Orders ED Discharge Orders          Ordered    EPINEPHrine 0.3 mg/0.3 mL IJ SOAJ injection  As needed        02/01/22 0746              Dorothyann Peng, PA-C 02/01/22 [place], [place], MD 02/02/22 1040

## 2022-02-01 NOTE — ED Triage Notes (Signed)
Pt states that he was stung by several bees yesterday while picking up something in his yard. Pt complains of arm pain and back pain where he was stung. Pt reports feeling something weird in his throat, no difficulty breathing.

## 2022-02-01 NOTE — Discharge Instructions (Addendum)
You were seen today for an allergic reaction to yellow jacket stings. You were given benadryl and a steroid. I have prescribed an epi-pen which can be used if you develop shortness of breath during a future allergic event. You may take oral benadryl as directed on the bottle as needed and may continue to use the benadryl cream. These bites may continue to itch and be irritated. Please follow up as needed with your primary care provider.
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
## 2022-07-19 ENCOUNTER — Ambulatory Visit (INDEPENDENT_AMBULATORY_CARE_PROVIDER_SITE_OTHER): Payer: Medicare HMO | Admitting: Neurology

## 2022-07-19 ENCOUNTER — Encounter: Payer: Self-pay | Admitting: Neurology

## 2022-07-19 VITALS — BP 133/89 | HR 94 | Ht 71.0 in | Wt 168.8 lb

## 2022-07-19 DIAGNOSIS — G8929 Other chronic pain: Secondary | ICD-10-CM | POA: Diagnosis not present

## 2022-07-19 DIAGNOSIS — R29898 Other symptoms and signs involving the musculoskeletal system: Secondary | ICD-10-CM | POA: Diagnosis not present

## 2022-07-19 DIAGNOSIS — M545 Low back pain, unspecified: Secondary | ICD-10-CM | POA: Diagnosis not present

## 2022-07-19 NOTE — Patient Instructions (Addendum)
I am not sure the cause of your leg weakness. I would like to recheck your EMG to ensure what the problem is that is affecting your legs.  We will do the EMG on Monday, 07/23/22 at 10 am. This will be scheduled for you today.  After the EMG, we will determine next steps.  The physicians and staff at [facility] are committed to providing excellent care. You may receive a survey requesting feedback about your experience at our office. We strive to receive "very good" responses to the survey questions. If you feel that your experience would prevent you from giving the office a "very good " response, please contact our office to try to remedy the situation. We may be reached at [phone]. Thank you for taking the time out of your busy day to complete the survey.  Kai Levins, MD [facility]  ELECTROMYOGRAM AND NERVE CONDUCTION STUDIES (EMG/NCS) INSTRUCTIONS  How to Prepare The neurologist conducting the EMG will need to know if you have certain medical conditions. Tell the neurologist and other EMG lab personnel if you: Have a pacemaker or any other electrical medical device Take blood-thinning medications Have hemophilia, a blood-clotting disorder that causes prolonged bleeding Bathing Take a shower or bath shortly before your exam in order to remove oils from your skin. Don't apply lotions or creams before the exam.  What to Expect You'll likely be asked to change into a hospital gown for the procedure and lie down on an examination table. The following explanations can help you understand what will happen during the exam.  Electrodes. The neurologist or a technician places surface electrodes at various locations on your skin depending on where you're experiencing symptoms. Or the neurologist may insert needle electrodes at different sites depending on your symptoms.  Sensations. The electrodes will at times transmit a tiny electrical current that you may feel as a twinge or spasm.  The needle electrode may cause discomfort or pain that usually ends shortly after the needle is removed. If you are concerned about discomfort or pain, you may want to talk to the neurologist about taking a short break during the exam.  Instructions. During the needle EMG, the neurologist will assess whether there is any spontaneous electrical activity when the muscle is at rest - activity that isn't present in healthy muscle tissue - and the degree of activity when you slightly contract the muscle.  He or she will give you instructions on resting and contracting a muscle at appropriate times. Depending on what muscles and nerves the neurologist is examining, he or she may ask you to change positions during the exam.  After your EMG You may experience some temporary, minor bruising where the needle electrode was inserted into your muscle. This bruising should fade within several days. If it persists, contact your primary care doctor.   Preventing Falls at [facility] are common, often dreaded events in the lives of older people. Aside from the obvious injuries and even death that may result, fall can cause wide-ranging consequences including loss of independence, mental decline, decreased activity and mobility. Younger people are also at risk of falling, especially those with chronic illnesses and fatigue.  Ways to reduce risk for falling Examine diet and medications. Warm foods and alcohol dilate blood vessels, which can lead to dizziness when standing. Sleep aids, antidepressants and pain medications can also increase the likelihood of a fall.  Get a vision exam. Poor vision, cataracts and glaucoma increase the chances of  falling.  Check foot gear. Shoes should fit snugly and have a sturdy, nonskid sole and a broad, low heel  Participate in a physician-approved exercise program to build and maintain muscle strength and improve balance and coordination. Programs that use ankle weights or stretch  bands are excellent for muscle-strengthening. Water aerobics programs and low-impact Tai Chi programs have also been shown to improve balance and coordination.  Increase vitamin D intake. Vitamin D improves muscle strength and increases the amount of calcium the body is able to absorb and deposit in bones.  How to prevent falls from common hazards Floors - Remove all loose wires, cords, and throw rugs. Minimize clutter. Make sure rugs are anchored and smooth. Keep furniture in its usual place.  Chairs -- Use chairs with straight backs, armrests and firm seats. Add firm cushions to existing pieces to add height.  Bathroom - Install grab bars and non-skid tape in the tub or shower. Use a bathtub transfer bench or a shower chair with a back support Use an elevated toilet seat and/or safety rails to assist standing from a low surface. Do not use towel racks or bathroom tissue holders to help you stand.  Lighting - Make sure halls, stairways, and entrances are well-lit. Install a night light in your bathroom or hallway. Make sure there is a light switch at the top and bottom of the staircase. Turn lights on if you get up in the middle of the night. Make sure lamps or light switches are within reach of the bed if you have to get up during the night.  Kitchen - Install non-skid rubber mats near the sink and stove. Clean spills immediately. Store frequently used utensils, pots, pans between waist and eye level. This helps prevent reaching and bending. Sit when getting things out of lower cupboards.  Living room/ Bedrooms - Place furniture with wide spaces in between, giving enough room to move around. Establish a route through the living room that gives you something to hold onto as you walk.  Stairs - Make sure treads, rails, and rugs are secure. Install a rail on both sides of the stairs. If stairs are a threat, it might be helpful to arrange most of your activities on the lower level to reduce the number  of times you must climb the stairs.  Entrances and doorways - Install metal handles on the walls adjacent to the doorknobs of all doors to make it more secure as you travel through the doorway.  Tips for maintaining balance Keep at least one hand free at all times. Try using a backpack or fanny pack to hold things rather than carrying them in your hands. Never carry objects in both hands when walking as this interferes with keeping your balance.  Attempt to swing both arms from front to back while walking. This might require a conscious effort if Parkinson's disease has diminished your movement. It will, however, help you to maintain balance and posture, and reduce fatigue.  Consciously lift your feet off of the ground when walking. Shuffling and dragging of the feet is a common culprit in losing your balance.  When trying to navigate turns, use a "U" technique of facing forward and making a wide turn, rather than pivoting sharply.  Try to stand with your feet shoulder-length apart. When your feet are close together for any length of time, you increase your risk of losing your balance and falling.  Do one thing at a time. Don't try to walk and  accomplish another task, such as reading or looking around. The decrease in your automatic reflexes complicates motor function, so the less distraction, the better.  Do not wear rubber or gripping soled shoes, they might "catch" on the floor and cause tripping.  Move slowly when changing positions. Use deliberate, concentrated movements and, if needed, use a grab bar or walking aid. Count 15 seconds between each movement. For example, when rising from a seated position, wait 15 seconds after standing to begin walking.  If balance is a continuous problem, you might want to consider a walking aid such as a cane, walking stick, or walker. Once you've mastered walking with help, you might be ready to try it on your own again.

## 2022-07-23 ENCOUNTER — Ambulatory Visit (INDEPENDENT_AMBULATORY_CARE_PROVIDER_SITE_OTHER): Payer: Medicare HMO | Admitting: Neurology

## 2022-07-23 ENCOUNTER — Telehealth: Payer: Self-pay | Admitting: Neurology

## 2022-07-23 DIAGNOSIS — G6289 Other specified polyneuropathies: Secondary | ICD-10-CM | POA: Diagnosis not present

## 2022-07-23 DIAGNOSIS — R29898 Other symptoms and signs involving the musculoskeletal system: Secondary | ICD-10-CM

## 2022-07-23 DIAGNOSIS — M5417 Radiculopathy, lumbosacral region: Secondary | ICD-10-CM

## 2022-07-23 NOTE — Procedures (Signed)
[facility]  [place], [place]  [place], [place] [address] Tel: [phone] Fax: [phone] Test Date:  07/23/2022  Patient: [name] DOB: 01-15-1946 Physician: Kai Levins, MD  Sex: Male Height: '5\' 11"'$  Ref Phys: Kai Levins, MD  ID#: [id number]   Technician:    History: This is a 77 year old male with bilateral leg weakness.  NCV & EMG Findings: Extensive electrodiagnostic evaluation of the left lower limb with additional nerve conduction studies of the left upper limb and right lower limb shows: Left sural, superficial peroneal/fibular, median, and ulnar sensory responses are absent. Left radial sensory response is within normal limits. Left peroneal/fibular (EDB) motor response shows reduced amplitude (2.3 mV) and decreased conduction velocity (30 m/s). Right peroneal/fibular (EDB) motor response shows decreased conduction velocity (30 m/s). Left peroneal/fibular (TA) motor response is within normal limits. Right peroneal/tibial (TA) motor response shows decreased conduction velocity (35 m/s). Bilateral tibial (AH) motor responses show decreased amplitude (L3.8 mV, R1.37 mV) and reduced conduction velocity on the left (35 m/s) with right proximal stimulation site showing no response. Left median (APB) motor response shows prolonged distal onset latency (6.1 ms) and decreased conduction velocity (39 m/s). Left ulnar motor response shows decreased conduction velocity (42 m/s). Left tibial (AH) F wave latency is prolonged (72.3 ms). Left H reflex is absent. Chronic motor axon loss changes with active denervation changes are seen in the left short head of biceps femoris. Chronic motor axon loss changes without accompanying active denervation changes are seen in the left tibialis anterior, left medial head of gastrocnemius, and left L5 lumbar paraspinal muscles. Decreased recruitment without motor unit configuration changes are seen in the left rectus femoris and left  gluteus medius muscles.  Impression: This is a complex electrodiagnostic evaluation. The findings are most consistent with the following: Evidence of a mixed axonal and demyelinating polyneuropathy, severe in degree electrically. Evidence of the residuals an overlapping intraspinal canal lesion(s) (ie: lumbosacral motor radiculopathy) given the asymmetry and involvement of the L5 paraspinal muscles. The degree of severity and roots affected are difficult to determine given #1 above, but most likely includes left L5-S1 nerve roots.  No electrodiagnostic evidence of myopathy.    ___________________________ Kai Levins, MD  Nerve Conduction Studies Motor Nerve Results    Latency Amplitude F-Lat Segment Distance CV Comment  Site (ms) Norm (mV) Norm (ms)  (cm) (m/s) Norm   Left Fibular (EDB) Motor  Ankle 4.2  < 6.0 *2.3  > 2.5        Bel fib head 15.3 - 2.3 -  Bel fib head-Ankle 33.5 *30  > 40   Pop fossa 17.4 - 2.2 -  Pop fossa-Bel fib head 8 38 -   Right Fibular (EDB) Motor  Ankle 4.3  < 6.0 5.0  > 2.5        Bel fib head 14.9 - 4.1 -  Bel fib head-Ankle 32 *30  > 40   Pop fossa 16.6 - 3.8 -  Pop fossa-Bel fib head 8 47 -   Left Fibular (TA) Motor  Fib head 4.2  < 4.5 4.1  > 3.0        Pop fossa 6.2  < 6.7 3.9 -  Pop fossa-Fib head 10 50  > 40   Right Fibular (TA) Motor  Fib head 2.8  < 4.5 4.3  > 3.0        Pop fossa 5.1  < 6.7 3.8 -  Pop fossa-Fib head 8 *  35  > 40   Left Median (APB) Motor  Wrist *6.1  < 4.0 8.1  > 5.0        Elbow 13.2 - 7.3 -  Elbow-Wrist 28 *39  > 50   Left Tibial (AH) Motor  Ankle 4.9  < 6.0 *3.8  > 4.0 *72.3       Knee 17.6 - 2.1 -  Knee-Ankle 44 *35  > 40   Right Tibial (AH) Motor  Ankle 5.4  < 6.0 *1.37  > 4.0        Post exercise 5.3 - 1.58 -        Knee *NR - *NR -  Knee-Ankle 47.5 *NR  > 40   Left Ulnar (ADM) Motor  Wrist 2.7  < 3.1 7.0  > 7.0        Bel elbow 8.9 - 7.0 -  Bel elbow-Wrist 26 *42  > 50   Ab elbow 12.0 - 7.1 -  Ab elbow-Bel elbow  10 32 -    Sensory Sites    Neg Peak Lat Amplitude (O-P) Segment Distance Velocity Comment  Site (ms) Norm (V) Norm  (cm) (ms)   Left Median Sensory  Wrist-Dig II *NR  < 3.8 *NR  > 10 Wrist-Dig II 13    Left Radial Sensory  Forearm-Wrist *2.9  < 2.8 15  > 10 Forearm-Wrist 10    Left Superficial Fibular Sensory  14 cm-Ankle *NR  < 4.6 *NR  > 3 14 cm-Ankle 14    Left Sural Sensory  Calf-Lat mall *NR  < 4.6 *NR  > 3 Calf-Lat mall 14    Left Ulnar Sensory  Wrist-Dig V *NR  < 3.2 *NR  > 5 Wrist-Dig V 11     H-Reflex Results    M-Lat H Lat H Neg Amp H-M Lat  Site (ms) (ms) Norm (mV) (ms)  Left Tibial H-Reflex  Pop fossa 8.6 --  < 35.0 -- --   Electromyography   Side Muscle Ins.Act Fibs Fasc Recrt Amp Dur Poly Activation Comment  Left Tib ant Nml Nml Nml *2- *1+ *1+ *2+ Nml N/A  Left Gastroc MH Nml Nml Nml Nml Nml Nml Nml Nml N/A  Left Rectus fem Nml Nml Nml *2- Nml Nml Nml Nml N/A  Left Biceps fem SH *CRD *2+ Nml *2- *1+ *1+ *1+ Nml N/A  Left Gluteus med Nml Nml Nml *2- Nml Nml Nml Nml N/A  Left Lumbar PSP lower Nml Nml Nml *1- *1+ *1+ *1+ Nml N/A  Left Iliacus Nml Nml Nml Nml Nml Nml Nml Nml N/A    Waveforms:  Motor                    Sensory             F-Wave    H-Reflex

## 2022-07-23 NOTE — Telephone Encounter (Signed)
Discussed the results of patient's EMG after the procedure today. It showed a mixed demyelinating and axonal polyneuropathy, severe in degree electrically, with likely overlapping left lumbosacral radiculopathy. I explained that the cause of his neuropathy was not currently clear.   The differential including CIDP, hereditary neuropathy, amyloidosis (patient denies history of heart disease of any kind) among other possibilities.  I discussed the work up below, which patient agreed to.   Updated plan: -Blood work: HbA1c, B12, IFE, kappa/lambda light chains -Lumbar puncture with routine analysis (cell count, protein, glucose), IgG index, and oligoclonal bands. Protein will likely be elevated due to lumbar spine disease (Froin syndrome), so I will be looking for significantly elevated protein. -Will consider sponsored genetic testing for hereditary neuropathy and TTR amyloid through [facility] pending other work up -Follow up with patient after work up is complete (~1 month)  All questions were answered.  Kai Levins, MD [facility]

## 2022-07-24 ENCOUNTER — Other Ambulatory Visit: Payer: Self-pay | Admitting: Neurology

## 2022-07-24 ENCOUNTER — Other Ambulatory Visit: Payer: Self-pay

## 2022-07-24 DIAGNOSIS — R29898 Other symptoms and signs involving the musculoskeletal system: Secondary | ICD-10-CM

## 2022-07-24 DIAGNOSIS — M5417 Radiculopathy, lumbosacral region: Secondary | ICD-10-CM

## 2022-07-24 DIAGNOSIS — G6289 Other specified polyneuropathies: Secondary | ICD-10-CM

## 2022-07-24 DIAGNOSIS — M545 Low back pain, unspecified: Secondary | ICD-10-CM

## 2022-07-25 ENCOUNTER — Other Ambulatory Visit (INDEPENDENT_AMBULATORY_CARE_PROVIDER_SITE_OTHER): Payer: Medicare HMO

## 2022-07-25 DIAGNOSIS — R29898 Other symptoms and signs involving the musculoskeletal system: Secondary | ICD-10-CM

## 2022-07-25 DIAGNOSIS — G8929 Other chronic pain: Secondary | ICD-10-CM

## 2022-07-25 DIAGNOSIS — G6289 Other specified polyneuropathies: Secondary | ICD-10-CM

## 2022-07-25 DIAGNOSIS — M5417 Radiculopathy, lumbosacral region: Secondary | ICD-10-CM

## 2022-07-25 LAB — HEMOGLOBIN A1C: Hgb A1c MFr Bld: 6.3 % (ref 4.6–6.5)

## 2022-07-25 LAB — VITAMIN B12: Vitamin B-12: 409 pg/mL (ref 211–911)

## 2022-07-31 ENCOUNTER — Telehealth: Payer: Self-pay | Admitting: Neurology

## 2022-07-31 NOTE — Telephone Encounter (Signed)
Pt called in stating he made an appointment on 08/02/22 for a lumbar puncture. He is a little nervous and would like to know exactly why our office recommended this procedure?

## 2022-07-31 NOTE — Telephone Encounter (Signed)
Returned patient's call. I explained that we were doing the LP to look for the cause of his neuropathy, specifically for inflammation (CIDP?). This would help know how to treat. Patient understood felt good about having the procedure on 08/02/22.  I will be in touch when I have his results from the CSF.  All questions were answered.  Alex Levins, MD [facility]

## 2022-08-01 LAB — IMMUNOFIXATION ELECTROPHORESIS
IgG (Immunoglobin G), Serum: 3772 mg/dL — ABNORMAL HIGH (ref 600–1540)
IgM, Serum: 226 mg/dL (ref 50–300)
Immunoglobulin A: 256 mg/dL (ref 70–320)

## 2022-08-01 LAB — KAPPA/LAMBDA LIGHT CHAINS
Kappa free light chain: 105.8 mg/L — ABNORMAL HIGH (ref 3.3–19.4)
Kappa:Lambda Ratio: 1.4 (ref 0.26–1.65)
Lambda Free Lght Chn: 75.6 mg/L — ABNORMAL HIGH (ref 5.7–26.3)

## 2022-08-02 ENCOUNTER — Ambulatory Visit
Admission: RE | Admit: 2022-08-02 | Discharge: 2022-08-02 | Disposition: A | Discharge status: Home / Self Care | Payer: Medicare HMO | Source: Ambulatory Visit | Attending: Neurology | Admitting: Neurology

## 2022-08-02 VITALS — BP 126/83 | HR 60

## 2022-08-02 DIAGNOSIS — G6289 Other specified polyneuropathies: Secondary | ICD-10-CM

## 2022-08-02 DIAGNOSIS — M5417 Radiculopathy, lumbosacral region: Secondary | ICD-10-CM

## 2022-08-02 DIAGNOSIS — R29898 Other symptoms and signs involving the musculoskeletal system: Secondary | ICD-10-CM

## 2022-08-02 DIAGNOSIS — M545 Low back pain, unspecified: Secondary | ICD-10-CM

## 2022-08-02 NOTE — Discharge Instructions (Signed)

## 2022-08-02 NOTE — Progress Notes (Signed)
Labs drawn from pts RAC to be sent off with LP lab work. 1 successful attempt. Pt tolerated well. Gauze and tape applied after.

## 2022-08-07 LAB — CSF CELL COUNT WITH DIFFERENTIAL
RBC Count, CSF: 1 cells/uL — ABNORMAL HIGH
TOTAL NUCLEATED CELL: 0 cells/uL (ref 0–5)

## 2022-08-07 LAB — CNS IGG SYNTHESIS RATE, CSF+BLOOD
Albumin Serum: 3.4 g/dL — ABNORMAL LOW (ref 3.6–5.1)
Albumin, CSF: 22.6 mg/dL (ref 8.0–42.0)
CNS-IgG Synthesis Rate: -19 mg/24 h — ABNORMAL LOW (ref <=3.3)
IgG (Immunoglobin G), Serum: 3570 mg/dL — ABNORMAL HIGH (ref 600–1540)
IgG Total CSF: 9.4 mg/dL — ABNORMAL HIGH (ref 0.8–7.7)
IgG-Index: 0.4 (ref <0.70)

## 2022-08-07 LAB — GLUCOSE, CSF: Glucose, CSF: 58 mg/dL (ref 40–80)

## 2022-08-07 LAB — OLIGOCLONAL BANDS, CSF + SERM: Oligo Bands: ABSENT

## 2022-08-07 LAB — PROTEIN, CSF: Total Protein, CSF: 48 mg/dL (ref 15–60)
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
## 2022-08-09 ENCOUNTER — Encounter: Payer: Self-pay | Admitting: Neurology

## 2022-08-09 ENCOUNTER — Ambulatory Visit: Payer: Medicare HMO | Admitting: Neurology

## 2022-08-09 VITALS — BP 128/86 | HR 94 | Ht 71.0 in | Wt 170.2 lb

## 2022-08-09 DIAGNOSIS — M5417 Radiculopathy, lumbosacral region: Secondary | ICD-10-CM | POA: Diagnosis not present

## 2022-08-09 DIAGNOSIS — G6289 Other specified polyneuropathies: Secondary | ICD-10-CM | POA: Diagnosis not present

## 2022-08-09 DIAGNOSIS — G8929 Other chronic pain: Secondary | ICD-10-CM

## 2022-08-09 DIAGNOSIS — R29898 Other symptoms and signs involving the musculoskeletal system: Secondary | ICD-10-CM

## 2022-08-09 DIAGNOSIS — M545 Low back pain, unspecified: Secondary | ICD-10-CM

## 2022-08-09 DIAGNOSIS — M25562 Pain in left knee: Secondary | ICD-10-CM

## 2022-08-09 DIAGNOSIS — W19XXXA Unspecified fall, initial encounter: Secondary | ICD-10-CM

## 2022-08-09 NOTE — Patient Instructions (Addendum)
We discussed that your results do not support an inflammatory condition attacking your nerves (CIDP). I would like to send a genetic test to look for any abnormalities that may explain your symptoms.  I will have the company mail this to you. You will follow the instructions and send in your saliva in their box. It is free of charge to you.  I will be in touch when I have your results.  I encourage you to call your orthopaedic doctor about your left knee pain after your fall to make sure you have not further injured the knee.  I want to see you back in clinic in 3 months.  If you have new or worsening symptoms, I want you to call our office.  The physicians and staff at [facility] are committed to providing excellent care. You may receive a survey requesting feedback about your experience at our office. We strive to receive "very good" responses to the survey questions. If you feel that your experience would prevent you from giving the office a "very good " response, please contact our office to try to remedy the situation. We may be reached at [phone]. Thank you for taking the time out of your busy day to complete the survey.  Kai Levins, MD [facility]  Preventing Falls at [facility] are common, often dreaded events in the lives of older people. Aside from the obvious injuries and even death that may result, fall can cause wide-ranging consequences including loss of independence, mental decline, decreased activity and mobility. Younger people are also at risk of falling, especially those with chronic illnesses and fatigue.  Ways to reduce risk for falling Examine diet and medications. Warm foods and alcohol dilate blood vessels, which can lead to dizziness when standing. Sleep aids, antidepressants and pain medications can also increase the likelihood of a fall.  Get a vision exam. Poor vision, cataracts and glaucoma increase the chances of falling.  Check foot gear.  Shoes should fit snugly and have a sturdy, nonskid sole and a broad, low heel  Participate in a physician-approved exercise program to build and maintain muscle strength and improve balance and coordination. Programs that use ankle weights or stretch bands are excellent for muscle-strengthening. Water aerobics programs and low-impact Tai Chi programs have also been shown to improve balance and coordination.  Increase vitamin D intake. Vitamin D improves muscle strength and increases the amount of calcium the body is able to absorb and deposit in bones.  How to prevent falls from common hazards Floors - Remove all loose wires, cords, and throw rugs. Minimize clutter. Make sure rugs are anchored and smooth. Keep furniture in its usual place.  Chairs -- Use chairs with straight backs, armrests and firm seats. Add firm cushions to existing pieces to add height.  Bathroom - Install grab bars and non-skid tape in the tub or shower. Use a bathtub transfer bench or a shower chair with a back support Use an elevated toilet seat and/or safety rails to assist standing from a low surface. Do not use towel racks or bathroom tissue holders to help you stand.  Lighting - Make sure halls, stairways, and entrances are well-lit. Install a night light in your bathroom or hallway. Make sure there is a light switch at the top and bottom of the staircase. Turn lights on if you get up in the middle of the night. Make sure lamps or light switches are within reach of the bed if you have to  get up during the night.  Kitchen - Install non-skid rubber mats near the sink and stove. Clean spills immediately. Store frequently used utensils, pots, pans between waist and eye level. This helps prevent reaching and bending. Sit when getting things out of lower cupboards.  Living room/ Bedrooms - Place furniture with wide spaces in between, giving enough room to move around. Establish a route through the living room that gives you  something to hold onto as you walk.  Stairs - Make sure treads, rails, and rugs are secure. Install a rail on both sides of the stairs. If stairs are a threat, it might be helpful to arrange most of your activities on the lower level to reduce the number of times you must climb the stairs.  Entrances and doorways - Install metal handles on the walls adjacent to the doorknobs of all doors to make it more secure as you travel through the doorway.  Tips for maintaining balance Keep at least one hand free at all times. Try using a backpack or fanny pack to hold things rather than carrying them in your hands. Never carry objects in both hands when walking as this interferes with keeping your balance.  Attempt to swing both arms from front to back while walking. This might require a conscious effort if Parkinson's disease has diminished your movement. It will, however, help you to maintain balance and posture, and reduce fatigue.  Consciously lift your feet off of the ground when walking. Shuffling and dragging of the feet is a common culprit in losing your balance.  When trying to navigate turns, use a "U" technique of facing forward and making a wide turn, rather than pivoting sharply.  Try to stand with your feet shoulder-length apart. When your feet are close together for any length of time, you increase your risk of losing your balance and falling.  Do one thing at a time. Don't try to walk and accomplish another task, such as reading or looking around. The decrease in your automatic reflexes complicates motor function, so the less distraction, the better.  Do not wear rubber or gripping soled shoes, they might "catch" on the floor and cause tripping.  Move slowly when changing positions. Use deliberate, concentrated movements and, if needed, use a grab bar or walking aid. Count 15 seconds between each movement. For example, when rising from a seated position, wait 15 seconds after standing to  begin walking.  If balance is a continuous problem, you might want to consider a walking aid such as a cane, walking stick, or walker. Once you've mastered walking with help, you might be ready to try it on your own again.

## 2022-08-17 ENCOUNTER — Ambulatory Visit: Payer: Medicare HMO | Admitting: Neurology

## 2022-08-30 ENCOUNTER — Other Ambulatory Visit: Payer: Medicare HMO

## 2022-09-13 ENCOUNTER — Telehealth: Payer: Self-pay

## 2022-09-13 NOTE — Telephone Encounter (Signed)
Patient is calling in stating that he received a kit in the mail, he sent it off and the company sent him another kit (I am not able to get information on what the kit was for as patient just describes what it looks like). Patient got another kit in the mail and wants to know if he should do the test again. Also wants to know why he needs to do an MRI if he has already had 2 done.

## 2022-09-14 NOTE — Telephone Encounter (Signed)
Called and reported Dr. Karren Burly notes to pt. He had his wife take notes to fill out the forms correctly for next Kit.

## 2022-09-25 ENCOUNTER — Emergency Department (HOSPITAL_COMMUNITY): Payer: No Typology Code available for payment source

## 2022-09-25 ENCOUNTER — Emergency Department (HOSPITAL_COMMUNITY)
Admission: EM | Admit: 2022-09-25 | Discharge: 2022-09-26 | Disposition: A | Discharge status: Home / Self Care | Payer: Medicare HMO | Attending: Emergency Medicine | Admitting: Emergency Medicine

## 2022-09-25 ENCOUNTER — Encounter (HOSPITAL_COMMUNITY): Payer: Self-pay | Admitting: Emergency Medicine

## 2022-09-25 DIAGNOSIS — D649 Anemia, unspecified: Secondary | ICD-10-CM | POA: Diagnosis not present

## 2022-09-25 DIAGNOSIS — R42 Dizziness and giddiness: Secondary | ICD-10-CM

## 2022-09-25 LAB — CBC
HCT: 34.6 % — ABNORMAL LOW (ref 39.0–52.0)
Hemoglobin: 11.5 g/dL — ABNORMAL LOW (ref 13.0–17.0)
MCH: 27.4 pg (ref 26.0–34.0)
MCHC: 33.2 g/dL (ref 30.0–36.0)
MCV: 82.6 fL (ref 80.0–100.0)
Platelets: 218 10*3/uL (ref 150–400)
RBC: 4.19 MIL/uL — ABNORMAL LOW (ref 4.22–5.81)
RDW: 15.9 % — ABNORMAL HIGH (ref 11.5–15.5)
WBC: 4.4 10*3/uL (ref 4.0–10.5)
nRBC: 0 % (ref 0.0–0.2)

## 2022-09-25 NOTE — ED Triage Notes (Signed)
Per EMS, from home c/o dizziness and chest pressure since 8:30am.  Noticed it when he tried to get off the couch and feel back to the couch, no injury.  Generalize chest pressure, 12lead ok, negative stroke.    144/92 Pulse 86 R 18 98% RA BG 129 18G L forarm 324 ASA 1 nitro - no change in bp or pain.

## 2022-09-25 NOTE — ED Provider Triage Note (Signed)
Emergency Medicine Provider Triage Evaluation Note  [name] , a 77 y.o. male  was evaluated in triage.  Pt complains of chest tightness, dizziness.  Symptoms began few hours prior to arrival.  He gets significant dizziness when he looks down. described as the room spinning.  He has never had this symptom before.  Review of Systems  Positive: As above Negative: As above  Physical Exam  BP 118/82 (BP Location: Right Arm)   Pulse 76   Temp 98.2 F (36.8 C) (Oral)   Resp 20   Ht '5\' 11"'$  (1.803 m)   Wt 77.2 kg   SpO2 100%   BMI 23.74 kg/m  Gen:   Awake, no distress   Resp:  Normal effort  MSK:   Moves extremities without difficulty  Other:  Tearful in triage  Medical Decision Making  Medically screening exam initiated at 10:07 PM.  Appropriate orders placed.  [name] was informed that the remainder of the evaluation will be completed by another provider, this initial triage assessment does not replace that evaluation, and the importance of remaining in the ED until their evaluation is complete.     [name], [place] 09/25/22 2208

## 2022-09-26 ENCOUNTER — Emergency Department (HOSPITAL_COMMUNITY): Payer: Medicare HMO

## 2022-09-26 LAB — TROPONIN I (HIGH SENSITIVITY)
Troponin I (High Sensitivity): 5 ng/L (ref <18)
Troponin I (High Sensitivity): 6 ng/L (ref <18)

## 2022-09-26 LAB — BASIC METABOLIC PANEL
Anion gap: 7 (ref 5–15)
BUN: 11 mg/dL (ref 8–23)
CO2: 27 mmol/L (ref 22–32)
Calcium: 9 mg/dL (ref 8.9–10.3)
Chloride: 102 mmol/L (ref 98–111)
Creatinine, Ser: 0.75 mg/dL (ref 0.61–1.24)
GFR, Estimated: >60 mL/min (ref >60)
Glucose, Bld: 101 mg/dL — ABNORMAL HIGH (ref 70–99)
Potassium: 3.9 mmol/L (ref 3.5–5.1)
Sodium: 136 mmol/L (ref 135–145)

## 2022-09-26 MED ORDER — MECLIZINE HCL 25 MG PO TABS: 25.0000 mg | ORAL_TABLET | Freq: Three times a day (TID) | ORAL | 0 refills | Status: DC | PRN

## 2022-09-26 MED ORDER — MECLIZINE HCL 25 MG PO TABS
25.0000 mg | ORAL_TABLET | Freq: Once | ORAL | Status: AC
  Administered 2022-09-26: 25 mg via ORAL
  Filled 2022-09-26: qty 1
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
## 2022-09-26 NOTE — ED Provider Notes (Signed)
[facility] Provider Note   CSN: [id number] Arrival date & time: 09/25/22  2147     History  Chief Complaint  Patient presents with   Chest Pain   Dizziness    [name] is a 77 y.o. male.  The history is provided by the patient.  Chest Pain Associated symptoms: dizziness   Dizziness Associated symptoms: chest pain   He has history of autoimmune pancreatitis, sarcoidosis and noted tonight that if he stood up, he got very dizzy and had to lay back down.  When he got dizzy, the room would spin but he denies any nausea or vomiting.  He has never had anything like this before.  He denies any headache.   Home Medications Prior to Admission medications   Medication Sig Start Date End Date Taking? Authorizing Provider  ACCU-CHEK AVIVA PLUS test strip  05/24/17   [provider]  ACCU-CHEK SOFTCLIX LANCETS lancets  05/24/17   [provider]  Blood Glucose Monitoring Suppl (ACCU-CHEK AVIVA PLUS) w/Device KIT  05/24/17   [provider]  brimonidine (ALPHAGAN) 0.2 % ophthalmic solution Place 1 drop into the right eye 2 (two) times a day.    [provider]  brinzolamide (AZOPT) 1 % ophthalmic suspension Place 1 drop into the right eye 2 (two) times a day.    [provider]  cyclobenzaprine (FLEXERIL) 10 MG tablet Take 1 tablet (10 mg total) by mouth 3 (three) times daily. 10/08/17   Vanessa Kick, MD  EPINEPHrine 0.3 mg/0.3 mL IJ SOAJ injection Inject 0.3 mg into the muscle as needed for anaphylaxis. 02/01/22   Dorothyann Peng, PA-C  latanoprost (XALATAN) 0.005 % ophthalmic solution Place 1 drop into both eyes at bedtime. As directed 06/19/14   [provider]  methocarbamol (ROBAXIN) 500 MG tablet Take 1 tablet (500 mg total) by mouth 2 (two) times daily. 10/25/21   Alroy Bailiff, Margaux, PA-C  naproxen (NAPROSYN) 500 MG tablet Take 1 tablet (500 mg total) by mouth 2 (two) times daily. 10/25/21    Alroy Bailiff, Margaux, PA-C  predniSONE (DELTASONE) 1 MG tablet Take 4 mg by mouth daily.  04/14/18   [provider]  timolol (TIMOPTIC) 0.5 % ophthalmic solution Place 1 drop into both eyes 2 (two) times daily.  05/19/17   [provider]      Allergies    Gabapentin and Penicillins    Review of Systems   Review of Systems  Cardiovascular:  Positive for chest pain.  Neurological:  Positive for dizziness.  All other systems reviewed and are negative.   Physical Exam Updated Vital Signs BP 118/82 (BP Location: Right Arm)   Pulse 76   Temp 98.2 F (36.8 C) (Oral)   Resp 20   Ht '5\' 11"'$  (1.803 m)   Wt 77.2 kg   SpO2 100%   BMI 23.74 kg/m  Physical Exam Vitals and nursing note reviewed.   77 year old male, resting comfortably and in no acute distress. Vital signs are normal. Oxygen saturation is 100%, which is normal. Head is normocephalic and atraumatic. PERRLA, EOMI. Oropharynx is clear. Neck is nontender and supple without adenopathy or JVD. Back is nontender and there is no CVA tenderness. Lungs are clear without rales, wheezes, or rhonchi. Chest is nontender. Heart has regular rate and rhythm without murmur. Abdomen is soft, flat, nontender. Extremities have no cyanosis or edema, full range of motion is present. Skin is warm and dry without  rash. Neurologic: Mental status is normal, cranial nerves are intact, strength is 5/5 in all 4 extremities.  Finger-nose testing is normal.  Dizziness is not reproduced by passive head movement.  There is no nystagmus.  On Romberg testing, he tends to fall backward.  ED Results / Procedures / Treatments   Labs (all labs ordered are listed, but only abnormal results are displayed) Labs Reviewed  BASIC METABOLIC PANEL - Abnormal; Notable for the following components:      Result Value   Glucose, Bld 101 (*)    All other components within normal limits  CBC - Abnormal; Notable for the following components:   RBC 4.19 (*)     Hemoglobin 11.5 (*)    HCT 34.6 (*)    RDW 15.9 (*)    All other components within normal limits  TROPONIN I (HIGH SENSITIVITY)  TROPONIN I (HIGH SENSITIVITY)    EKG EKG Interpretation  Date/Time:  Tuesday September 25 2022 21:53:03 EDT Ventricular Rate:  67 PR Interval:  172 QRS Duration: 88 QT Interval:  388 QTC Calculation: 409 R Axis:   44 Text Interpretation: Normal sinus rhythm Normal ECG When compared with ECG of 08-Mar-2015 11:07, No significant change was found Confirmed by Delora Fuel ([id number]) on 09/26/2022 12:18:39 AM  Radiology MR BRAIN WO CONTRAST  Result Date: 09/26/2022 CLINICAL DATA:  Syncope EXAM: MRI HEAD WITHOUT CONTRAST TECHNIQUE: Multiplanar, multiecho pulse sequences of the brain and surrounding structures were obtained without intravenous contrast. COMPARISON:  None Available. FINDINGS: Brain: No acute infarct, mass effect or extra-axial collection. No acute or chronic hemorrhage. Normal white matter signal, parenchymal volume and CSF spaces. The midline structures are normal. Vascular: Major flow voids are preserved. Skull and upper cervical spine: Normal calvarium and skull base. Visualized upper cervical spine and soft tissues are normal. Sinuses/Orbits:No paranasal sinus fluid levels or advanced mucosal thickening. No mastoid or middle ear effusion. Normal orbits. IMPRESSION: Normal brain MRI. Electronically Signed   By: Ulyses Jarred M.D.   On: 09/26/2022 03:50   CT Head Wo Contrast  Result Date: 09/25/2022 CLINICAL DATA:  Dizziness, chest pressure, headache EXAM: CT HEAD WITHOUT CONTRAST TECHNIQUE: Contiguous axial images were obtained from the base of the skull through the vertex without intravenous contrast. RADIATION DOSE REDUCTION: This exam was performed according to the departmental dose-optimization program which includes automated exposure control, adjustment of the mA and/or kV according to patient size and/or use of iterative reconstruction technique.  COMPARISON:  10/25/2021 FINDINGS: Brain: No acute infarct or hemorrhage. Lateral ventricles and midline structures are unremarkable. No acute extra-axial fluid collections. No mass effect. Vascular: No hyperdense vessel or unexpected calcification. Skull: Normal. Negative for fracture or focal lesion. Sinuses/Orbits: No acute finding. Other: None. IMPRESSION: 1. No acute intracranial process. Electronically Signed   By: Randa Ngo M.D.   On: 09/25/2022 22:47   DG Chest 2 View  Result Date: 09/25/2022 CLINICAL DATA:  Chest pain EXAM: CHEST - 2 VIEW COMPARISON:  Chest x-ray 10/25/2021.  CT of the chest 10/25/2021. FINDINGS: There some patchy reticulonodular and hazy opacities in both lungs, right greater than left. No pleural effusion or pneumothorax. Cardiomediastinal silhouette is within normal limits. IMPRESSION: Patchy reticulonodular and hazy opacities in both lungs, right greater than left. Findings are nonspecific and may be infectious or inflammatory. Electronically Signed   By: Ronney Asters M.D.   On: 09/25/2022 22:38    Procedures Procedures    Medications Ordered in ED Medications  meclizine (ANTIVERT) tablet 25 mg (  has no administration in time range)    ED Course/ Medical Decision Making/ A&P                             Medical Decision Making Amount and/or Complexity of Data Reviewed Labs: ordered. Radiology: ordered.   Dizziness which is concerning for central vertigo.  I have reviewed and interpreted his electrocardiogram, and my interpretation is normal ECG.  I reviewed and interpreted his laboratory test, and my interpretation is normochromic normocytic anemia which is new compared with 10/25/2021 with hemoglobin drop of 2.7 g.  Metabolic panel is significant only for minimally elevated random glucose.  Chest x-ray has patchy reticulonodular and hazy opacities in both lungs which is likely related to his known history of sarcoidosis, CT of head is normal.  I have  independently viewed the images, and agree with radiologist's interpretation.  I am ordering an MRI scan to look for evidence of posterior circulation stroke.  MRI shows no evidence of stroke or tumor.  I had the patient ambulate in the emergency department and he had only mild dizziness.  This may actually be peripheral vertigo.  I have ordered a dose of meclizine.  I am discharging him with prescription for meclizine.  I have encouraged him to follow-up with primary care provider to evaluate his drop in hemoglobin.  He may need GI consultation for endoscopy/colonoscopy.  Return precautions are discussed.  Final Clinical Impression(s) / ED Diagnoses Final diagnoses:  Dizziness  Normochromic normocytic anemia    Rx / DC Orders ED Discharge Orders          Ordered    meclizine (ANTIVERT) 25 MG tablet  3 times daily PRN        09/26/22 [id number]              Delora Fuel, MD [id number]

## 2022-09-26 NOTE — Discharge Instructions (Addendum)
Take the meclizine as often as 3 times a day as needed for your dizziness.  Your hemoglobin has dropped compared with 1 year ago.  Please follow-up with your primary care provider for further outpatient evaluation of this.  You might need referral to a gastroenterologist for an endoscopy/colonoscopy.  Return if symptoms or not being adequately controlled at home, or if you have any new or concerning symptoms.

## 2022-09-26 NOTE — ED Notes (Signed)
Patient ambulated with a steady gait. He reports feeling light headed while walking, but was not unsteady and was able to stand and sit from bed by himself.

## 2022-09-27 ENCOUNTER — Ambulatory Visit: Payer: Medicare HMO | Admitting: Neurology
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
## 2022-10-11 ENCOUNTER — Ambulatory Visit (INDEPENDENT_AMBULATORY_CARE_PROVIDER_SITE_OTHER): Payer: Medicare HMO | Admitting: Neurology

## 2022-10-11 ENCOUNTER — Encounter: Payer: Self-pay | Admitting: Neurology

## 2022-10-11 VITALS — BP 143/66 | HR 98 | Ht 71.0 in | Wt 172.0 lb

## 2022-10-11 DIAGNOSIS — R29898 Other symptoms and signs involving the musculoskeletal system: Secondary | ICD-10-CM | POA: Diagnosis not present

## 2022-10-11 DIAGNOSIS — M25562 Pain in left knee: Secondary | ICD-10-CM

## 2022-10-11 DIAGNOSIS — G8929 Other chronic pain: Secondary | ICD-10-CM

## 2022-10-11 DIAGNOSIS — M5417 Radiculopathy, lumbosacral region: Secondary | ICD-10-CM

## 2022-10-11 DIAGNOSIS — M545 Low back pain, unspecified: Secondary | ICD-10-CM | POA: Diagnosis not present

## 2022-10-11 DIAGNOSIS — W19XXXA Unspecified fall, initial encounter: Secondary | ICD-10-CM

## 2022-10-11 DIAGNOSIS — G6289 Other specified polyneuropathies: Secondary | ICD-10-CM | POA: Diagnosis not present

## 2022-10-11 MED ORDER — METHYLPREDNISOLONE 4 MG PO TBPK: ORAL_TABLET | ORAL | 0 refills | Status: DC

## 2022-10-11 NOTE — Patient Instructions (Signed)
We will get blood work today for your genetic testing since the other samples didn't work. I will be in touch when I have your results.  I am prescribing a steroid dose pack for you knee and back pain. It has been sent to your pharmacy. Take 6tabs x1day, then 5tabs x1day, then 4tabs x1day, then 3tabs x1day, then 2tabs x1day, then 1tab x1day, then STOP  I will see you back in 6 months or sooner if needed.  The physicians and staff at [facility] are committed to providing excellent care. You may receive a survey requesting feedback about your experience at our office. We strive to receive "very good" responses to the survey questions. If you feel that your experience would prevent you from giving the office a "very good " response, please contact our office to try to remedy the situation. We may be reached at [phone]. Thank you for taking the time out of your busy day to complete the survey.  Kai Levins, MD [facility]  Preventing Falls at [facility] are common, often dreaded events in the lives of older people. Aside from the obvious injuries and even death that may result, fall can cause wide-ranging consequences including loss of independence, mental decline, decreased activity and mobility. Younger people are also at risk of falling, especially those with chronic illnesses and fatigue.  Ways to reduce risk for falling Examine diet and medications. Warm foods and alcohol dilate blood vessels, which can lead to dizziness when standing. Sleep aids, antidepressants and pain medications can also increase the likelihood of a fall.  Get a vision exam. Poor vision, cataracts and glaucoma increase the chances of falling.  Check foot gear. Shoes should fit snugly and have a sturdy, nonskid sole and a broad, low heel  Participate in a physician-approved exercise program to build and maintain muscle strength and improve balance and coordination. Programs that use ankle weights or  stretch bands are excellent for muscle-strengthening. Water aerobics programs and low-impact Tai Chi programs have also been shown to improve balance and coordination.  Increase vitamin D intake. Vitamin D improves muscle strength and increases the amount of calcium the body is able to absorb and deposit in bones.  How to prevent falls from common hazards Floors - Remove all loose wires, cords, and throw rugs. Minimize clutter. Make sure rugs are anchored and smooth. Keep furniture in its usual place.  Chairs -- Use chairs with straight backs, armrests and firm seats. Add firm cushions to existing pieces to add height.  Bathroom - Install grab bars and non-skid tape in the tub or shower. Use a bathtub transfer bench or a shower chair with a back support Use an elevated toilet seat and/or safety rails to assist standing from a low surface. Do not use towel racks or bathroom tissue holders to help you stand.  Lighting - Make sure halls, stairways, and entrances are well-lit. Install a night light in your bathroom or hallway. Make sure there is a light switch at the top and bottom of the staircase. Turn lights on if you get up in the middle of the night. Make sure lamps or light switches are within reach of the bed if you have to get up during the night.  Kitchen - Install non-skid rubber mats near the sink and stove. Clean spills immediately. Store frequently used utensils, pots, pans between waist and eye level. This helps prevent reaching and bending. Sit when getting things out of lower cupboards.  Living room/  Bedrooms - Place furniture with wide spaces in between, giving enough room to move around. Establish a route through the living room that gives you something to hold onto as you walk.  Stairs - Make sure treads, rails, and rugs are secure. Install a rail on both sides of the stairs. If stairs are a threat, it might be helpful to arrange most of your activities on the lower level to reduce  the number of times you must climb the stairs.  Entrances and doorways - Install metal handles on the walls adjacent to the doorknobs of all doors to make it more secure as you travel through the doorway.  Tips for maintaining balance Keep at least one hand free at all times. Try using a backpack or fanny pack to hold things rather than carrying them in your hands. Never carry objects in both hands when walking as this interferes with keeping your balance.  Attempt to swing both arms from front to back while walking. This might require a conscious effort if Parkinson's disease has diminished your movement. It will, however, help you to maintain balance and posture, and reduce fatigue.  Consciously lift your feet off of the ground when walking. Shuffling and dragging of the feet is a common culprit in losing your balance.  When trying to navigate turns, use a "U" technique of facing forward and making a wide turn, rather than pivoting sharply.  Try to stand with your feet shoulder-length apart. When your feet are close together for any length of time, you increase your risk of losing your balance and falling.  Do one thing at a time. Don't try to walk and accomplish another task, such as reading or looking around. The decrease in your automatic reflexes complicates motor function, so the less distraction, the better.  Do not wear rubber or gripping soled shoes, they might "catch" on the floor and cause tripping.  Move slowly when changing positions. Use deliberate, concentrated movements and, if needed, use a grab bar or walking aid. Count 15 seconds between each movement. For example, when rising from a seated position, wait 15 seconds after standing to begin walking.  If balance is a continuous problem, you might want to consider a walking aid such as a cane, walking stick, or walker. Once you've mastered walking with help, you might be ready to try it on your own again.

## 2022-10-15 ENCOUNTER — Telehealth: Payer: Self-pay

## 2022-10-15 NOTE — Telephone Encounter (Signed)
Pt called in and wanted me to let Joseph Art know he got her message.

## 2022-10-15 NOTE — Telephone Encounter (Signed)
Called pt 2 times today to let him know of the directions for the [facility]  sample.

## 2022-10-19 ENCOUNTER — Telehealth: Payer: Self-pay | Admitting: Anesthesiology

## 2022-10-19 NOTE — Telephone Encounter (Signed)
Pt left message with AN he received a kit in the mail yesterday. States a nurse was going to be sent to his house.

## 2022-10-24 ENCOUNTER — Telehealth: Payer: Self-pay

## 2022-10-24 NOTE — Telephone Encounter (Signed)
Called [facility] and they reported [name] has an appointment on April 11th at 1045. I called and left a message on voice mail to just remind him of this.

## 2022-10-31 ENCOUNTER — Telehealth: Payer: Self-pay

## 2022-10-31 NOTE — Telephone Encounter (Signed)
-----   Message from Antony Madura, MD sent at 10/31/2022 11:45 AM EDT ----- Regarding: Genetic testing [name],  Can you let [name] know that we finally were able to process his genetic testing? It did not show any clear abnormalities. Let him know we will continue to work on his problems, but that the only risk factor for neuropathy that we have identified was diabetes or pre-diabetes. He should continue to work with his PCP to control his blood sugars.  Thank you,  Jacquelyne Balint, MD

## 2022-10-31 NOTE — Telephone Encounter (Signed)
Called and left a message of Dr. Jorge Ny results. Told him to call the office with any questions of concerns. But mostly let us know that you understand the results.

## 2022-11-02 ENCOUNTER — Telehealth: Payer: Self-pay

## 2022-11-02 NOTE — Telephone Encounter (Signed)
-----   Message from Jeremy L Hill, MD sent at 10/31/2022 11:45 AM EDT ----- Regarding: Genetic testing Domonick Sittner,  Can you let Mr. Hellickson know that we finally were able to process his genetic testing? It did not show any clear abnormalities. Let him know we will continue to work on his problems, but that the only risk factor for neuropathy that we have identified was diabetes or pre-diabetes. He should continue to work with his PCP to control his blood sugars.  Thank you,  Jeremy Hill, MD  

## 2022-11-02 NOTE — Telephone Encounter (Signed)
Called pt and left detailed message of Dr. Jorge Ny results. Told him to call of questions or concerns

## 2022-11-05 ENCOUNTER — Telehealth: Payer: Self-pay

## 2022-11-05 NOTE — Telephone Encounter (Signed)
Called  11/02/22 and left message on VM of Dr. Loleta Chance results will call today to see if her receive the message.

## 2022-11-05 NOTE — Telephone Encounter (Signed)
-----   Message from Jeremy L Hill, MD sent at 10/31/2022 11:45 AM EDT ----- Regarding: Genetic testing [name],  Can you let [name] know that we finally were able to process his genetic testing? It did not show any clear abnormalities. Let him know we will continue to work on his problems, but that the only risk factor for neuropathy that we have identified was diabetes or pre-diabetes. He should continue to work with his PCP to control his blood sugars.  Thank you,  Jeremy Hill, MD  

## 2022-11-14 NOTE — Telephone Encounter (Signed)
Please attach to correct patient if still needed

## 2022-11-21 ENCOUNTER — Ambulatory Visit: Payer: Medicare HMO | Admitting: Neurology

## 2022-12-04 ENCOUNTER — Telehealth: Payer: Self-pay

## 2022-12-04 NOTE — Telephone Encounter (Signed)
Patient called to report he had a fall last week. Denied any injuries. He has not contacted PCP. He is concerned that he is still having issues with falls and has not been advised as to what is causing him to fall frequently. He is also concerned that he is not scheduled for follow up until October. Explained to patient LOV 10/11/22 indicated a 6 month follow up, which is why he is scheduled for 04/18/23. Patient feel this is too far out and he would like to know if Dr. Loleta Chance needs to see him sooner.   Please advise, thanks!
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
## 2022-12-07 ENCOUNTER — Encounter: Payer: Self-pay | Admitting: Neurology

## 2022-12-07 ENCOUNTER — Ambulatory Visit (INDEPENDENT_AMBULATORY_CARE_PROVIDER_SITE_OTHER): Payer: Medicare HMO | Admitting: Neurology

## 2022-12-07 VITALS — BP 120/84 | HR 105 | Resp 18 | Ht 71.0 in | Wt 166.6 lb

## 2022-12-07 DIAGNOSIS — R29898 Other symptoms and signs involving the musculoskeletal system: Secondary | ICD-10-CM

## 2022-12-07 DIAGNOSIS — M545 Low back pain, unspecified: Secondary | ICD-10-CM

## 2022-12-07 DIAGNOSIS — G8929 Other chronic pain: Secondary | ICD-10-CM

## 2022-12-07 DIAGNOSIS — W19XXXD Unspecified fall, subsequent encounter: Secondary | ICD-10-CM

## 2022-12-07 DIAGNOSIS — M5417 Radiculopathy, lumbosacral region: Secondary | ICD-10-CM | POA: Diagnosis not present

## 2022-12-07 DIAGNOSIS — G6289 Other specified polyneuropathies: Secondary | ICD-10-CM | POA: Diagnosis not present

## 2022-12-07 NOTE — Patient Instructions (Signed)
-Continue Robaxin for pain in right back and shoulder -Follow with knee surgeon as planned next week -Physical therapy for leg weakness -Will consider repeat EMG if you do not improve with physical therapy  Return to clinic as planned on 04/18/23  Please let me know if you have any questions or concerns in the meantime.  The physicians and staff at [facility] are committed to providing excellent care. You may receive a survey requesting feedback about your experience at our office. We strive to receive "very good" responses to the survey questions. If you feel that your experience would prevent you from giving the office a "very good " response, please contact our office to try to remedy the situation. We may be reached at [phone]. Thank you for taking the time out of your busy day to complete the survey.  Jacquelyne Balint, MD [facility]  Preventing Falls at [facility] are common, often dreaded events in the lives of older people. Aside from the obvious injuries and even death that may result, fall can cause wide-ranging consequences including loss of independence, mental decline, decreased activity and mobility. Younger people are also at risk of falling, especially those with chronic illnesses and fatigue.  Ways to reduce risk for falling Examine diet and medications. Warm foods and alcohol dilate blood vessels, which can lead to dizziness when standing. Sleep aids, antidepressants and pain medications can also increase the likelihood of a fall.  Get a vision exam. Poor vision, cataracts and glaucoma increase the chances of falling.  Check foot gear. Shoes should fit snugly and have a sturdy, nonskid sole and a broad, low heel  Participate in a physician-approved exercise program to build and maintain muscle strength and improve balance and coordination. Programs that use ankle weights or stretch bands are excellent for muscle-strengthening. Water aerobics programs and  low-impact Tai Chi programs have also been shown to improve balance and coordination.  Increase vitamin D intake. Vitamin D improves muscle strength and increases the amount of calcium the body is able to absorb and deposit in bones.  How to prevent falls from common hazards Floors - Remove all loose wires, cords, and throw rugs. Minimize clutter. Make sure rugs are anchored and smooth. Keep furniture in its usual place.  Chairs -- Use chairs with straight backs, armrests and firm seats. Add firm cushions to existing pieces to add height.  Bathroom - Install grab bars and non-skid tape in the tub or shower. Use a bathtub transfer bench or a shower chair with a back support Use an elevated toilet seat and/or safety rails to assist standing from a low surface. Do not use towel racks or bathroom tissue holders to help you stand.  Lighting - Make sure halls, stairways, and entrances are well-lit. Install a night light in your bathroom or hallway. Make sure there is a light switch at the top and bottom of the staircase. Turn lights on if you get up in the middle of the night. Make sure lamps or light switches are within reach of the bed if you have to get up during the night.  Kitchen - Install non-skid rubber mats near the sink and stove. Clean spills immediately. Store frequently used utensils, pots, pans between waist and eye level. This helps prevent reaching and bending. Sit when getting things out of lower cupboards.  Living room/ Bedrooms - Place furniture with wide spaces in between, giving enough room to move around. Establish a route through the living room that  gives you something to hold onto as you walk.  Stairs - Make sure treads, rails, and rugs are secure. Install a rail on both sides of the stairs. If stairs are a threat, it might be helpful to arrange most of your activities on the lower level to reduce the number of times you must climb the stairs.  Entrances and doorways - Install  metal handles on the walls adjacent to the doorknobs of all doors to make it more secure as you travel through the doorway.  Tips for maintaining balance Keep at least one hand free at all times. Try using a backpack or fanny pack to hold things rather than carrying them in your hands. Never carry objects in both hands when walking as this interferes with keeping your balance.  Attempt to swing both arms from front to back while walking. This might require a conscious effort if Parkinson's disease has diminished your movement. It will, however, help you to maintain balance and posture, and reduce fatigue.  Consciously lift your feet off of the ground when walking. Shuffling and dragging of the feet is a common culprit in losing your balance.  When trying to navigate turns, use a "U" technique of facing forward and making a wide turn, rather than pivoting sharply.  Try to stand with your feet shoulder-length apart. When your feet are close together for any length of time, you increase your risk of losing your balance and falling.  Do one thing at a time. Don't try to walk and accomplish another task, such as reading or looking around. The decrease in your automatic reflexes complicates motor function, so the less distraction, the better.  Do not wear rubber or gripping soled shoes, they might "catch" on the floor and cause tripping.  Move slowly when changing positions. Use deliberate, concentrated movements and, if needed, use a grab bar or walking aid. Count 15 seconds between each movement. For example, when rising from a seated position, wait 15 seconds after standing to begin walking.  If balance is a continuous problem, you might want to consider a walking aid such as a cane, walking stick, or walker. Once you've mastered walking with help, you might be ready to try it on your own again.

## 2022-12-25 ENCOUNTER — Ambulatory Visit: Payer: No Typology Code available for payment source | Admitting: Physical Therapy

## 2023-02-13 ENCOUNTER — Telehealth: Payer: Self-pay | Admitting: Neurology

## 2023-02-13 NOTE — Telephone Encounter (Signed)
Pt called and stated he is experiencing a sharp stabbing pain in the middle of his back that started 3 days ago and he's wondering if he can be seen

## 2023-02-13 NOTE — Telephone Encounter (Signed)
Patient called back and left message with answering service/KB

## 2023-02-13 NOTE — Telephone Encounter (Signed)
Pt called  I asked to make sure the pain is not radiating anywhere its just in the middle of his back, he was going to call his PCP but he thought he would call Dr Loleta Chance 1st because he was trying to treat his nerves 1st and see what was going on with him.

## 2023-02-13 NOTE — Telephone Encounter (Signed)
Pt called an advised that If patient is having isolated back pain without radiation into his legs or new leg symptoms, Dr Loleta Chance would recommend you talk to his PCP first. Pt was not really happy with that answer said ok and hung up on me,

## 2023-02-13 NOTE — Telephone Encounter (Signed)
Pt called no answer left a message to call back
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
## 2023-04-18 ENCOUNTER — Encounter: Payer: Self-pay | Admitting: Neurology

## 2023-04-18 ENCOUNTER — Ambulatory Visit: Payer: Medicare HMO | Admitting: Neurology

## 2023-04-18 VITALS — BP 131/91 | HR 99 | Ht 71.0 in | Wt 173.0 lb

## 2023-04-18 DIAGNOSIS — M5417 Radiculopathy, lumbosacral region: Secondary | ICD-10-CM | POA: Diagnosis not present

## 2023-04-18 DIAGNOSIS — R29898 Other symptoms and signs involving the musculoskeletal system: Secondary | ICD-10-CM

## 2023-04-18 DIAGNOSIS — G8929 Other chronic pain: Secondary | ICD-10-CM

## 2023-04-18 DIAGNOSIS — G6289 Other specified polyneuropathies: Secondary | ICD-10-CM | POA: Diagnosis not present

## 2023-04-18 DIAGNOSIS — M545 Low back pain, unspecified: Secondary | ICD-10-CM

## 2023-04-18 DIAGNOSIS — W19XXXD Unspecified fall, subsequent encounter: Secondary | ICD-10-CM

## 2023-04-18 DIAGNOSIS — M25562 Pain in left knee: Secondary | ICD-10-CM

## 2023-04-18 NOTE — Patient Instructions (Signed)
Keep staying active and working on your leg strength.  We discussed repeating your nerve testing (EMG), but given you feel like you are slowly improving, we agreed to hold off for now and reassess at our next visit.  I will see you back in 6 months or sooner if needed.  The physicians and staff at [facility] are committed to providing excellent care. You may receive a survey requesting feedback about your experience at our office. We strive to receive "very good" responses to the survey questions. If you feel that your experience would prevent you from giving the office a "very good " response, please contact our office to try to remedy the situation. We may be reached at [phone]. Thank you for taking the time out of your busy day to complete the survey.  Jacquelyne Balint, MD [facility]  Preventing Falls at [facility] are common, often dreaded events in the lives of older people. Aside from the obvious injuries and even death that may result, fall can cause wide-ranging consequences including loss of independence, mental decline, decreased activity and mobility. Younger people are also at risk of falling, especially those with chronic illnesses and fatigue.  Ways to reduce risk for falling Examine diet and medications. Warm foods and alcohol dilate blood vessels, which can lead to dizziness when standing. Sleep aids, antidepressants and pain medications can also increase the likelihood of a fall.  Get a vision exam. Poor vision, cataracts and glaucoma increase the chances of falling.  Check foot gear. Shoes should fit snugly and have a sturdy, nonskid sole and a broad, low heel  Participate in a physician-approved exercise program to build and maintain muscle strength and improve balance and coordination. Programs that use ankle weights or stretch bands are excellent for muscle-strengthening. Water aerobics programs and low-impact Tai Chi programs have also been shown to improve  balance and coordination.  Increase vitamin D intake. Vitamin D improves muscle strength and increases the amount of calcium the body is able to absorb and deposit in bones.  How to prevent falls from common hazards Floors - Remove all loose wires, cords, and throw rugs. Minimize clutter. Make sure rugs are anchored and smooth. Keep furniture in its usual place.  Chairs -- Use chairs with straight backs, armrests and firm seats. Add firm cushions to existing pieces to add height.  Bathroom - Install grab bars and non-skid tape in the tub or shower. Use a bathtub transfer bench or a shower chair with a back support Use an elevated toilet seat and/or safety rails to assist standing from a low surface. Do not use towel racks or bathroom tissue holders to help you stand.  Lighting - Make sure halls, stairways, and entrances are well-lit. Install a night light in your bathroom or hallway. Make sure there is a light switch at the top and bottom of the staircase. Turn lights on if you get up in the middle of the night. Make sure lamps or light switches are within reach of the bed if you have to get up during the night.  Kitchen - Install non-skid rubber mats near the sink and stove. Clean spills immediately. Store frequently used utensils, pots, pans between waist and eye level. This helps prevent reaching and bending. Sit when getting things out of lower cupboards.  Living room/ Bedrooms - Place furniture with wide spaces in between, giving enough room to move around. Establish a route through the living room that gives you something to hold  onto as you walk.  Stairs - Make sure treads, rails, and rugs are secure. Install a rail on both sides of the stairs. If stairs are a threat, it might be helpful to arrange most of your activities on the lower level to reduce the number of times you must climb the stairs.  Entrances and doorways - Install metal handles on the walls adjacent to the doorknobs of all  doors to make it more secure as you travel through the doorway.  Tips for maintaining balance Keep at least one hand free at all times. Try using a backpack or fanny pack to hold things rather than carrying them in your hands. Never carry objects in both hands when walking as this interferes with keeping your balance.  Attempt to swing both arms from front to back while walking. This might require a conscious effort if Parkinson's disease has diminished your movement. It will, however, help you to maintain balance and posture, and reduce fatigue.  Consciously lift your feet off of the ground when walking. Shuffling and dragging of the feet is a common culprit in losing your balance.  When trying to navigate turns, use a "U" technique of facing forward and making a wide turn, rather than pivoting sharply.  Try to stand with your feet shoulder-length apart. When your feet are close together for any length of time, you increase your risk of losing your balance and falling.  Do one thing at a time. Don't try to walk and accomplish another task, such as reading or looking around. The decrease in your automatic reflexes complicates motor function, so the less distraction, the better.  Do not wear rubber or gripping soled shoes, they might "catch" on the floor and cause tripping.  Move slowly when changing positions. Use deliberate, concentrated movements and, if needed, use a grab bar or walking aid. Count 15 seconds between each movement. For example, when rising from a seated position, wait 15 seconds after standing to begin walking.  If balance is a continuous problem, you might want to consider a walking aid such as a cane, walking stick, or walker. Once you've mastered walking with help, you might be ready to try it on your own again.

## 2023-06-20 ENCOUNTER — Telehealth: Payer: Self-pay | Admitting: Orthopedic Surgery

## 2023-06-20 NOTE — Telephone Encounter (Signed)
Please copy xrys from 08/21/2017 to CD. Please let me know when ready. Thanks!

## 2023-07-08 ENCOUNTER — Other Ambulatory Visit: Payer: Self-pay | Admitting: Neurology

## 2023-07-08 ENCOUNTER — Telehealth: Payer: Self-pay | Admitting: Neurology

## 2023-07-08 ENCOUNTER — Telehealth: Payer: Self-pay

## 2023-07-08 DIAGNOSIS — M25562 Pain in left knee: Secondary | ICD-10-CM

## 2023-07-08 DIAGNOSIS — M545 Low back pain, unspecified: Secondary | ICD-10-CM

## 2023-07-08 MED ORDER — METHYLPREDNISOLONE 4 MG PO TBPK: ORAL_TABLET | ORAL | 0 refills | Status: DC

## 2023-07-08 NOTE — Telephone Encounter (Signed)
Called and let pt know per Dr. Loleta Chance about med that was sent into pharmacy. He understood.

## 2023-07-08 NOTE — Telephone Encounter (Signed)
Called and pt reported  the pain is in his lower back to his butt. He has not taken robaxin for awhile. He can't remember if it helped or not. Had [facility] scheduled but said it was canceled. He reported that HE was not the one that canceled it in February 2024. He will be okay to do that.

## 2023-07-08 NOTE — Telephone Encounter (Signed)
Pt fell twice since we have seen him last in oct. He is having very sharp pain and can't get up from a chair please call

## 2023-07-12 ENCOUNTER — Other Ambulatory Visit: Payer: Self-pay

## 2023-07-12 DIAGNOSIS — W19XXXD Unspecified fall, subsequent encounter: Secondary | ICD-10-CM

## 2023-07-12 DIAGNOSIS — G6289 Other specified polyneuropathies: Secondary | ICD-10-CM

## 2023-07-12 DIAGNOSIS — W19XXXA Unspecified fall, initial encounter: Secondary | ICD-10-CM

## 2023-07-12 DIAGNOSIS — G8929 Other chronic pain: Secondary | ICD-10-CM

## 2023-07-12 DIAGNOSIS — M5417 Radiculopathy, lumbosacral region: Secondary | ICD-10-CM

## 2023-07-30 ENCOUNTER — Encounter: Payer: Self-pay | Admitting: Neurology

## 2023-08-06 ENCOUNTER — Other Ambulatory Visit: Payer: Medicare HMO

## 2023-08-25 ENCOUNTER — Other Ambulatory Visit: Payer: Medicare HMO
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
## 2023-10-18 ENCOUNTER — Encounter: Payer: Self-pay | Admitting: Neurology

## 2023-10-18 ENCOUNTER — Ambulatory Visit: Payer: Medicare HMO | Admitting: Neurology

## 2024-01-11 ENCOUNTER — Emergency Department (HOSPITAL_COMMUNITY)

## 2024-01-11 ENCOUNTER — Emergency Department (HOSPITAL_COMMUNITY)
Admission: EM | Admit: 2024-01-11 | Discharge: 2024-01-11 | Disposition: A | Discharge status: Home / Self Care | Attending: Emergency Medicine | Admitting: Emergency Medicine

## 2024-01-11 ENCOUNTER — Encounter (HOSPITAL_COMMUNITY): Payer: Self-pay

## 2024-01-11 ENCOUNTER — Other Ambulatory Visit: Payer: Self-pay

## 2024-01-11 DIAGNOSIS — R519 Headache, unspecified: Secondary | ICD-10-CM | POA: Insufficient documentation

## 2024-01-11 DIAGNOSIS — R0789 Other chest pain: Secondary | ICD-10-CM | POA: Insufficient documentation

## 2024-01-11 LAB — CBC
HCT: 41.9 % (ref 39.0–52.0)
Hemoglobin: 14 g/dL (ref 13.0–17.0)
MCH: 29.1 pg (ref 26.0–34.0)
MCHC: 33.4 g/dL (ref 30.0–36.0)
MCV: 87.1 fL (ref 80.0–100.0)
Platelets: 219 10*3/uL (ref 150–400)
RBC: 4.81 MIL/uL (ref 4.22–5.81)
RDW: 13.9 % (ref 11.5–15.5)
WBC: 4.5 10*3/uL (ref 4.0–10.5)
nRBC: 0 % (ref 0.0–0.2)

## 2024-01-11 LAB — BASIC METABOLIC PANEL WITH GFR
Anion gap: 8 (ref 5–15)
BUN: 17 mg/dL (ref 8–23)
CO2: 22 mmol/L (ref 22–32)
Calcium: 9.2 mg/dL (ref 8.9–10.3)
Chloride: 107 mmol/L (ref 98–111)
Creatinine, Ser: 0.57 mg/dL — ABNORMAL LOW (ref 0.61–1.24)
GFR, Estimated: >60 mL/min (ref >60)
Glucose, Bld: 180 mg/dL — ABNORMAL HIGH (ref 70–99)
Potassium: 3.5 mmol/L (ref 3.5–5.1)
Sodium: 137 mmol/L (ref 135–145)

## 2024-01-11 LAB — TROPONIN I (HIGH SENSITIVITY)
Troponin I (High Sensitivity): 6 ng/L (ref <18)
Troponin I (High Sensitivity): 8 ng/L (ref <18)

## 2024-01-11 MED ORDER — SODIUM CHLORIDE 0.9 % IV BOLUS
1000.0000 mL | Freq: Once | INTRAVENOUS | Status: AC
  Administered 2024-01-11: 1000 mL via INTRAVENOUS

## 2024-01-11 MED ORDER — DIPHENHYDRAMINE HCL 50 MG/ML IJ SOLN
12.5000 mg | Freq: Once | INTRAMUSCULAR | Status: AC
  Administered 2024-01-11: 12.5 mg via INTRAVENOUS
  Filled 2024-01-11: qty 1

## 2024-01-11 MED ORDER — PROCHLORPERAZINE EDISYLATE 10 MG/2ML IJ SOLN
10.0000 mg | Freq: Once | INTRAMUSCULAR | Status: AC
  Administered 2024-01-11: 10 mg via INTRAVENOUS
  Filled 2024-01-11: qty 2

## 2024-01-11 MED ORDER — ACETAMINOPHEN 500 MG PO TABS
1000.0000 mg | ORAL_TABLET | Freq: Once | ORAL | Status: AC
  Administered 2024-01-11: 1000 mg via ORAL
  Filled 2024-01-11: qty 2

## 2024-01-11 MED ORDER — KETOROLAC TROMETHAMINE 30 MG/ML IJ SOLN
15.0000 mg | Freq: Once | INTRAMUSCULAR | Status: AC
  Administered 2024-01-11: 15 mg via INTRAVENOUS
  Filled 2024-01-11: qty 1
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
## 2024-01-11 NOTE — Discharge Instructions (Addendum)
 We evaluated you for your headache and chest pain.  Your testing in the emergency department is reassuring including a CT scan of your head and cardiac enzyme testing.  We did not see signs of a heart attack or any dangerous cause of your headache.  We have placed a referral for cardiology appointment.  They should call you.  Please also follow-up with your primary doctor.  If you have any new or worsening symptoms such as severe worsening chest pain, difficulty breathing, lightheadedness or dizziness, fevers, cough, or any other new symptoms, please return for reassessment.
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
## 2024-05-18 ENCOUNTER — Encounter: Payer: Self-pay | Admitting: Radiology

## 2024-05-22 ENCOUNTER — Emergency Department (HOSPITAL_COMMUNITY)

## 2024-05-22 ENCOUNTER — Emergency Department (HOSPITAL_COMMUNITY)
Admission: EM | Admit: 2024-05-22 | Discharge: 2024-05-22 | Disposition: A | Discharge status: Home / Self Care | Attending: Emergency Medicine | Admitting: Emergency Medicine

## 2024-05-22 DIAGNOSIS — M25562 Pain in left knee: Secondary | ICD-10-CM | POA: Insufficient documentation

## 2024-05-22 DIAGNOSIS — W19XXXA Unspecified fall, initial encounter: Secondary | ICD-10-CM | POA: Insufficient documentation

## 2024-05-22 DIAGNOSIS — Y92511 Restaurant or cafe as the place of occurrence of the external cause: Secondary | ICD-10-CM | POA: Insufficient documentation

## 2024-05-22 DIAGNOSIS — Z96652 Presence of left artificial knee joint: Secondary | ICD-10-CM | POA: Diagnosis not present

## 2024-05-22 DIAGNOSIS — S8992XA Unspecified injury of left lower leg, initial encounter: Secondary | ICD-10-CM | POA: Diagnosis present

## 2024-05-22 DIAGNOSIS — S76112A Strain of left quadriceps muscle, fascia and tendon, initial encounter: Secondary | ICD-10-CM | POA: Insufficient documentation

## 2024-05-22 MED ORDER — OXYCODONE-ACETAMINOPHEN 5-325 MG PO TABS: 1.0000 | ORAL_TABLET | Freq: Four times a day (QID) | ORAL | 0 refills | Status: DC | PRN

## 2024-05-22 MED ORDER — MORPHINE SULFATE (PF) 4 MG/ML IV SOLN
4.0000 mg | Freq: Once | INTRAVENOUS | Status: AC
  Administered 2024-05-22: 4 mg via INTRAVENOUS
  Filled 2024-05-22: qty 1

## 2024-05-22 NOTE — Progress Notes (Signed)
 Orthopedic Tech Progress Note Patient Details:  [name] [birth year] [id number]  Ortho Devices Type of Ortho Device: Knee splint Ortho Device/Splint Location: LLE Ortho Device/Splint Interventions: Application   Post Interventions Patient Tolerated: Well  [name] [name] 05/22/2024, 1:50 PM

## 2024-05-22 NOTE — ED Provider Notes (Signed)
 [place] EMERGENCY DEPARTMENT AT [facility] Provider Note   CSN: [id number] Arrival date & time: 05/22/24  1024     Patient presents with: Knee Injury   [name] is a 78 y.o. male with past medical history significant for sarcoidosis, chronic hep C, autoimmune pancreatitis, history of left knee replacement 2 years ago who reports mechanical fall around an hour prior to arrival, patient reports that he fell backwards, he is having pain in the end above the left knee, most severely in the left upper leg.  He reports he has not been able to bear weight on the affected leg.  He rates his pain initially as 10/10, received 100 mcg of fentanyl  prior to arrival.   HPI     Prior to Admission medications   Medication Sig Start Date End Date Taking? Authorizing Provider  oxyCODONE -acetaminophen  (PERCOCET/ROXICET) 5-325 MG tablet Take 1 tablet by mouth every 6 (six) hours as needed for severe pain (pain score 7-10). 05/22/24  Yes Nahara Dona H, PA-C  ACCU-CHEK AVIVA PLUS test strip  05/24/17   [provider]  ACCU-CHEK SOFTCLIX LANCETS lancets  05/24/17   [provider]  Blood Glucose Monitoring Suppl (ACCU-CHEK AVIVA PLUS) w/Device KIT  05/24/17   [provider]  brimonidine (ALPHAGAN) 0.2 % ophthalmic solution Place 1 drop into the right eye 2 (two) times a day.    [provider]  brinzolamide (AZOPT) 1 % ophthalmic suspension Place 1 drop into the right eye 2 (two) times a day.    [provider]  EPINEPHrine  0.3 mg/0.3 mL IJ SOAJ injection Inject 0.3 mg into the muscle as needed for anaphylaxis. Patient not taking: Reported on 04/18/2023 02/01/22   [name], PA-C  ferrous sulfate 325 (65 FE) MG tablet Take 325 mg by mouth daily with breakfast. Patient not taking: Reported on 04/18/2023 10/03/22   [provider]  latanoprost (XALATAN) 0.005 % ophthalmic solution Place 1 drop into both eyes at bedtime. As  directed 06/19/14   [provider]  meclizine  (ANTIVERT ) 25 MG tablet Take 1 tablet (25 mg total) by mouth 3 (three) times daily as needed for dizziness. 09/26/22   Raford Lenis, MD  methocarbamol  (ROBAXIN ) 500 MG tablet Take 1 tablet (500 mg total) by mouth 2 (two) times daily. Patient not taking: Reported on 04/18/2023 10/25/21   Shepard Clinch, PA-C  methylPREDNISolone  (MEDROL  DOSEPAK) 4 MG TBPK tablet Take 6tabs x1day, then 5tabs x1day, then 4tabs x1day, then 3tabs x1day, then 2tabs x1day, then 1tab x1day, then STOP 07/08/23   Leigh Venetia CROME, MD  naproxen  (NAPROSYN ) 500 MG tablet Take 1 tablet (500 mg total) by mouth 2 (two) times daily. Patient not taking: Reported on 04/18/2023 10/25/21   Venter, Margaux, PA-C  timolol (TIMOPTIC) 0.5 % ophthalmic solution Place 1 drop into both eyes 2 (two) times daily.  05/19/17   [provider]    Allergies: Gabapentin and Penicillins    Review of Systems  All other systems reviewed and are negative.   Updated Vital Signs BP (!) 158/96 (BP Location: Right Arm)   Pulse 68   Temp 98.2 F (36.8 C) (Oral)   Resp 20   SpO2 100%   Physical Exam Vitals and nursing note reviewed.  Constitutional:      General: He is not in acute distress.    Appearance: Normal appearance.  HENT:     Head: Normocephalic and atraumatic.  Eyes:     General:  Right eye: No discharge.        Left eye: No discharge.  Cardiovascular:     Rate and Rhythm: Normal rate and regular rhythm.     Pulses: Normal pulses.     Heart sounds: No murmur heard.    No friction rub. No gallop.     Comments: Dp, PT pulses 2+ in the affected left lower extremity Pulmonary:     Effort: Pulmonary effort is normal.     Breath sounds: Normal breath sounds.  Abdominal:     General: Bowel sounds are normal.     Palpations: Abdomen is soft.  Musculoskeletal:     Comments: Focal tenderness to palpation left anterior femur, no obvious step-off or deformity.  No  significant tenderness to palpation or effusion of the left knee.  After pain control patient was able to bear weight on the affected extremity and take some steps with assistance.  Skin:    General: Skin is warm and dry.     Capillary Refill: Capillary refill takes less than 2 seconds.  Neurological:     Mental Status: He is alert and oriented to person, place, and time.  Psychiatric:        Mood and Affect: Mood normal.        Behavior: Behavior normal.     (all labs ordered are listed, but only abnormal results are displayed) Labs Reviewed - No data to display  EKG: None  Radiology: DG Femur Min 2 Views Left Result Date: 05/22/2024 EXAM: 2 VIEW(S) XRAY OF THE LEFT FEMUR 05/22/2024 11:18:00 AM COMPARISON: None available. CLINICAL HISTORY: fall FINDINGS: BONES AND JOINTS: No acute fracture. Components of left knee arthroplasty partially visualized in place. Small effusion in the suprapatellar bursa. SOFT TISSUES: Faint vascular calcifications. IMPRESSION: 1. No acute fracture or dislocation. 2. Small suprapatellar bursal effusion. 3. Left knee arthroplasty components partially visualized. Electronically signed by: Ryan Salvage MD 05/22/2024 11:54 AM EST RP Workstation: [id number]   DG Knee Complete 4 Views Left Result Date: 05/22/2024 EXAM: 4 or more VIEW(S) XRAY OF THE LEFT KNEE 05/22/2024 11:18:00 AM COMPARISON: Left knee radiograph 08/21/2017. CLINICAL HISTORY: injury FINDINGS: BONES AND JOINTS: 3 compartment total knee arthroplasty in normal alignment without loosening or periprosthetic fracture. Small suprapatellar bursal effusion. SOFT TISSUES: Mild anterior soft tissue swelling. Scattered vascular calcifications posteriorly. IMPRESSION: 1. Total knee arthroplasty in normal alignment without loosening or periprosthetic fracture. 2. Small suprapatellar bursal effusion. 3. Mild anterior soft tissue swelling. Electronically signed by: Donnice Mania MD 05/22/2024 11:52 AM EST RP  Workstation: [id number]     Procedures   Medications Ordered in the ED  morphine  (PF) 4 MG/ML injection 4 mg (4 mg Intravenous Given 05/22/24 1133)                                    Medical Decision Making Amount and/or Complexity of Data Reviewed Radiology: ordered.  Risk Prescription drug management.   This patient is a 78 y.o. male who presents to the ED for concern of knee pain, left upper leg pain.   Differential diagnoses prior to evaluation: Fracture, dislocation, strain, sprain, versus other considered periprosthetic fracture, ligamentous injury as well  Past Medical History / Social History / Additional history: Chart reviewed. Pertinent results include: Previous knee replacement on affected side  Physical Exam: Physical exam performed. The pertinent findings include:  Focal tenderness to palpation left anterior femur, no obvious step-off  or deformity.  No significant tenderness to palpation or effusion of the left knee.  After pain control patient was able to bear weight on the affected extremity and take some steps with assistance.  Imaging: I independently interpreted imaging including plain film radiograph of left femur, left knee which shows  1. No acute fracture or dislocation.  2. Small suprapatellar bursal effusion.  3. Left knee arthroplasty components partially visualized.   No evidence of femur fracture . I agree with the radiologist interpretation.  Medications / Treatment: Morphine  for pain, will plan to discharge with short course of Percocet, knee brace  Given the majority of the patient's pain is located on his anterior quadriceps I suspect that patient with sprain of quadriceps Muscle.  Given his ability to bear weight low clinical suspicion for occult periprosthetic fracture or occult femur fracture.  Provided knee brace, encouraged close orthopedic follow-up.   Disposition: After consideration of the diagnostic results and the patients  response to treatment, I feel that patient is stable for discharge with plan as above .   emergency department workup does not suggest an emergent condition requiring admission or immediate intervention beyond what has been performed at this time. The plan is: as above. The patient is safe for discharge and has been instructed to return immediately for worsening symptoms, change in symptoms or any other concerns.   Final diagnoses:  Acute pain of left knee  Strain of left quadriceps, initial encounter    ED Discharge Orders          Ordered    oxyCODONE -acetaminophen  (PERCOCET/ROXICET) 5-325 MG tablet  Every 6 hours PRN        05/22/24 1251               Konstantinos Cordoba, Kiln H, PA-C 05/22/24 1330    Garrick Charleston, MD 05/22/24 [phone]

## 2024-05-22 NOTE — Discharge Instructions (Addendum)
 Please use Tylenol or ibuprofen for pain.  You may use 600 mg ibuprofen every 6 hours or 1000 mg of Tylenol every 6 hours.  You may choose to alternate between the 2.  This would be most effective.  Not to exceed 4 g of Tylenol within 24 hours.  Not to exceed 3200 mg ibuprofen 24 hours.  You can use the stronger narcotic pain medication in place of Tylenol for severe break through pain.  If you take the narcotic pain medication that we prescribed recommend that you also take a laxative such as MiraLAX or Dulcolax every day that you take the narcotic pain medicine, and drink plenty of fluids, 50 to 64 ounces to prevent any constipation.
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
## 2024-07-14 ENCOUNTER — Emergency Department (HOSPITAL_COMMUNITY)

## 2024-07-14 ENCOUNTER — Emergency Department (HOSPITAL_COMMUNITY)
Admission: EM | Admit: 2024-07-14 | Discharge: 2024-07-14 | Disposition: A | Discharge status: Home / Self Care | Attending: Emergency Medicine | Admitting: Emergency Medicine

## 2024-07-14 ENCOUNTER — Other Ambulatory Visit: Payer: Self-pay

## 2024-07-14 DIAGNOSIS — Y92000 Kitchen of unspecified non-institutional (private) residence as  the place of occurrence of the external cause: Secondary | ICD-10-CM | POA: Diagnosis not present

## 2024-07-14 DIAGNOSIS — W19XXXA Unspecified fall, initial encounter: Secondary | ICD-10-CM

## 2024-07-14 DIAGNOSIS — S0083XA Contusion of other part of head, initial encounter: Secondary | ICD-10-CM | POA: Diagnosis not present

## 2024-07-14 DIAGNOSIS — W01198A Fall on same level from slipping, tripping and stumbling with subsequent striking against other object, initial encounter: Secondary | ICD-10-CM | POA: Diagnosis not present

## 2024-07-14 DIAGNOSIS — S0990XA Unspecified injury of head, initial encounter: Secondary | ICD-10-CM | POA: Diagnosis present

## 2024-07-14 DIAGNOSIS — S060X1A Concussion with loss of consciousness of 30 minutes or less, initial encounter: Secondary | ICD-10-CM | POA: Insufficient documentation

## 2024-07-14 LAB — CBC WITH DIFFERENTIAL/PLATELET
Abs Immature Granulocytes: 0.01 K/uL (ref 0.00–0.07)
Basophils Absolute: 0 K/uL (ref 0.0–0.1)
Basophils Relative: 0 %
Eosinophils Absolute: 0.1 K/uL (ref 0.0–0.5)
Eosinophils Relative: 2 %
HCT: 41.1 % (ref 39.0–52.0)
Hemoglobin: 13.6 g/dL (ref 13.0–17.0)
Immature Granulocytes: 0 %
Lymphocytes Relative: 41 %
Lymphs Abs: 2.1 K/uL (ref 0.7–4.0)
MCH: 29.6 pg (ref 26.0–34.0)
MCHC: 33.1 g/dL (ref 30.0–36.0)
MCV: 89.5 fL (ref 80.0–100.0)
Monocytes Absolute: 0.8 K/uL (ref 0.1–1.0)
Monocytes Relative: 15 %
Neutro Abs: 2.1 K/uL (ref 1.7–7.7)
Neutrophils Relative %: 42 %
Platelets: 195 K/uL (ref 150–400)
RBC: 4.59 MIL/uL (ref 4.22–5.81)
RDW: 13.4 % (ref 11.5–15.5)
WBC: 5 K/uL (ref 4.0–10.5)
nRBC: 0 % (ref 0.0–0.2)

## 2024-07-14 LAB — LIPASE, BLOOD: Lipase: 11 U/L (ref 11–51)

## 2024-07-14 LAB — COMPREHENSIVE METABOLIC PANEL WITH GFR
ALT: 19 U/L (ref 0–44)
AST: 43 U/L — ABNORMAL HIGH (ref 15–41)
Albumin: 3.6 g/dL (ref 3.5–5.0)
Alkaline Phosphatase: 52 U/L (ref 38–126)
Anion gap: 7 (ref 5–15)
BUN: 13 mg/dL (ref 8–23)
CO2: 28 mmol/L (ref 22–32)
Calcium: 9.2 mg/dL (ref 8.9–10.3)
Chloride: 103 mmol/L (ref 98–111)
Creatinine, Ser: 0.63 mg/dL (ref 0.61–1.24)
GFR, Estimated: >60 mL/min
Glucose, Bld: 93 mg/dL (ref 70–99)
Potassium: 4.2 mmol/L (ref 3.5–5.1)
Sodium: 138 mmol/L (ref 135–145)
Total Bilirubin: 1.1 mg/dL (ref 0.0–1.2)
Total Protein: 8.5 g/dL — ABNORMAL HIGH (ref 6.5–8.1)

## 2024-07-14 NOTE — ED Notes (Signed)
 Cleaned and dressed pts lac on face

## 2024-07-14 NOTE — ED Provider Notes (Signed)
 " [place] EMERGENCY DEPARTMENT AT [facility] Provider Note   CSN: [id number] Arrival date & time: 07/14/24  1242     Patient presents with: [name] is a 78 y.o. male.   Patient to ED after mechanical fall just prior to arrival. His wife was in the next room and heard him hit the floor and reports he called to her as he fell after tripping. He denies chest pain or dizziness prior to fall. His wife reports a brief LOC. No nausea or vomiting, no incontinence, no seizure. Per EMS, the patient will go in and out of consciousness.   The history is provided by the patient, the spouse and a relative. No language interpreter was used.  Fall       Prior to Admission medications  Medication Sig Start Date End Date Taking? Authorizing Provider  ACCU-CHEK AVIVA PLUS test strip  05/24/17   [provider]  ACCU-CHEK SOFTCLIX LANCETS lancets  05/24/17   [provider]  Blood Glucose Monitoring Suppl (ACCU-CHEK AVIVA PLUS) w/Device KIT  05/24/17   [provider]  brimonidine (ALPHAGAN) 0.2 % ophthalmic solution Place 1 drop into the right eye 2 (two) times a day.    [provider]  brinzolamide (AZOPT) 1 % ophthalmic suspension Place 1 drop into the right eye 2 (two) times a day.    [provider]  EPINEPHrine  0.3 mg/0.3 mL IJ SOAJ injection Inject 0.3 mg into the muscle as needed for anaphylaxis. Patient not taking: Reported on 04/18/2023 02/01/22   [name], PA-C  ferrous sulfate 325 (65 FE) MG tablet Take 325 mg by mouth daily with breakfast. Patient not taking: Reported on 04/18/2023 10/03/22   [provider]  latanoprost (XALATAN) 0.005 % ophthalmic solution Place 1 drop into both eyes at bedtime. As directed 06/19/14   [provider]  meclizine  (ANTIVERT ) 25 MG tablet Take 1 tablet (25 mg total) by mouth 3 (three) times daily as needed for dizziness. 09/26/22   Raford Lenis, MD  methocarbamol   (ROBAXIN ) 500 MG tablet Take 1 tablet (500 mg total) by mouth 2 (two) times daily. Patient not taking: Reported on 04/18/2023 10/25/21   Shepard Clinch, PA-C  methylPREDNISolone  (MEDROL  DOSEPAK) 4 MG TBPK tablet Take 6tabs x1day, then 5tabs x1day, then 4tabs x1day, then 3tabs x1day, then 2tabs x1day, then 1tab x1day, then STOP 07/08/23   Leigh Venetia CROME, MD  naproxen  (NAPROSYN ) 500 MG tablet Take 1 tablet (500 mg total) by mouth 2 (two) times daily. Patient not taking: Reported on 04/18/2023 10/25/21   Venter, Margaux, PA-C  oxyCODONE -acetaminophen  (PERCOCET/ROXICET) 5-325 MG tablet Take 1 tablet by mouth every 6 (six) hours as needed for severe pain (pain score 7-10). 05/22/24   Prosperi, Christian H, PA-C  timolol (TIMOPTIC) 0.5 % ophthalmic solution Place 1 drop into both eyes 2 (two) times daily.  05/19/17   [provider]    Allergies: Gabapentin and Penicillins    Review of Systems  Updated Vital Signs BP (!) 141/88 (BP Location: Left Arm)   Pulse 77   Temp 98.2 F (36.8 C) (Oral)   Resp 17   SpO2 99%   Physical Exam Vitals and nursing note reviewed.  Constitutional:      General: He is not in acute distress.    Appearance: Normal appearance.     Comments: Slow to respond but responds appropriately.  Neck:     Comments: No midline cervical tenderness.  Cardiovascular:  Rate and Rhythm: Normal rate and regular rhythm.     Heart sounds: No murmur heard. Musculoskeletal:        General: No swelling or deformity. Normal range of motion.     Cervical back: Normal range of motion and neck supple.  Skin:    General: Skin is warm and dry.     Comments: Facial abrasion to left cheek with associated swelling.  Neurological:     GCS: GCS eye subscore is 4. GCS verbal subscore is 5. GCS motor subscore is 6.     Cranial Nerves: Cranial nerves 2-12 are intact.     Sensory: Sensation is intact.     Motor: No abnormal muscle tone.     Gait: Gait normal.     Comments:  Initially, he is slow to respond, eyes open and tracking, obeys commands delivered in loud voice like he is hard of hearing.     (all labs ordered are listed, but only abnormal results are displayed) Labs Reviewed  COMPREHENSIVE METABOLIC PANEL WITH GFR - Abnormal; Notable for the following components:      Result Value   Total Protein 8.5 (*)    AST 43 (*)    All other components within normal limits  CBC WITH DIFFERENTIAL/PLATELET  LIPASE, BLOOD  URINALYSIS, W/ REFLEX TO CULTURE (INFECTION SUSPECTED)    EKG: EKG Interpretation Date/Time:  Tuesday July 14 2024 12:54:12 EST Ventricular Rate:  65 PR Interval:  175 QRS Duration:  105 QT Interval:  401 QTC Calculation: 417 R Axis:   60  Text Interpretation: Sinus rhythm Probable left atrial enlargement Confirmed by Alex Charleston [phone]) on 07/14/2024 2:58:18 PM  Radiology: ARCOLA Ribs Unilateral W/Chest Left Result Date: 07/14/2024 EXAM: 1 VIEW(S) XRAY OF THE LEFT RIBS AND CHEST 07/14/2024 01:15:00 PM COMPARISON: 01/11/2024 CLINICAL HISTORY: fall FINDINGS: BONES: No displaced rib fractures identified. LUNGS AND PLEURA: Diffuse nodular interstitial opacities are similar to the previous exam compatible with the patient's history of sarcoidosis. No consolidation or pulmonary edema. No pleural effusion or pneumothorax. HEART AND MEDIASTINUM: No acute abnormality of the cardiac and mediastinal silhouettes. IMPRESSION: 1. No displaced rib fractures identified. Electronically signed by: Waddell Calk MD 07/14/2024 02:36 PM EST RP Workstation: [id number]   CT Maxillofacial Wo Contrast Result Date: 07/14/2024 EXAM: CT OF THE FACE WITHOUT CONTRAST 07/14/2024 01:05:00 PM TECHNIQUE: CT of the face was performed without the administration of intravenous contrast. Multiplanar reformatted images are provided for review. Automated exposure control, iterative reconstruction, and/or weight based adjustment of the mA/kV was utilized to reduce the  radiation dose to as low as reasonably achievable. COMPARISON: Same day CT head. CLINICAL HISTORY: Facial trauma, blunt. FINDINGS: FACIAL BONES: No acute maxillofacial fracture. Chronic appearing right nasal bone deformity. No mandibular condyle dislocation. The maxilla and mandible are edentulous. No suspicious bone lesion. ORBITS: Globes are intact. Bilateral lens replacements. No acute traumatic injury. No inflammatory change. SINUSES AND MASTOIDS: Findings suggestive of chronic left sphenoid sinusitis with postsurgical changes of left sphenoidotomy. Minimal mucosal thickening in the right maxillary sinus. Scattered mild mucosal thickening in the ethmoid sinuses. SOFT TISSUES: There is left sided facial soft tissue swelling overlying the zygomatic arch. Small calculus within the anterior superficial lobe of the left parotid gland. IMPRESSION: 1. No acute maxillofacial fracture. 2. Left sided facial soft tissue swelling overlying the zygomatic arch. 3. Chronic appearing right nasal bone deformity. Electronically signed by: Donnice Mania MD 07/14/2024 01:50 PM EST RP Workstation: [id number]   CT Cervical Spine Wo Contrast  Result Date: 07/14/2024 EXAM: CT CERVICAL SPINE WITHOUT CONTRAST 07/14/2024 01:05:00 PM TECHNIQUE: CT of the cervical spine was performed without the administration of intravenous contrast. Multiplanar reformatted images are provided for review. Automated exposure control, iterative reconstruction, and/or weight based adjustment of the mA/kV was utilized to reduce the radiation dose to as low as reasonably achievable. COMPARISON: 10/25/2021 CLINICAL HISTORY: Neck trauma (Age >= 65y) FINDINGS: BONES AND ALIGNMENT: No acute fracture or traumatic malalignment. DEGENERATIVE CHANGES: Disc space narrowing most pronounced at C6-C7 with additional mild disc space narrowing at C3-C4 and C5-C6. There are disc bulges at multiple levels. There is mild spinal canal stenosis at C3-C4 and C5-C6. Facet  arthrosis and uncovertebral hypertrophy at multiple levels. SOFT TISSUES: No prevertebral soft tissue swelling. LUNGS: Biapical scarring. There are opacities within the lung apices with areas of intralobular septal thickening which could reflect pulmonary edema. Multiple additional nodular opacities noted within the lung apices which should be related to infection. Recommend correlation with dedicated chest imaging. IMPRESSION: 1. No evidence of acute traumatic injury to the cervical spine. 2. Opacities in the lung apices which could reflect infection or pulmonary edema, as above. Recommend dedicated chest imaging for further evaluation. Electronically signed by: Donnice Mania MD 07/14/2024 01:41 PM EST RP Workstation: [id number]   CT Head Wo Contrast Result Date: 07/14/2024 EXAM: CT HEAD WITHOUT CONTRAST 07/14/2024 01:05:00 PM TECHNIQUE: CT of the head was performed without the administration of intravenous contrast. Automated exposure control, iterative reconstruction, and/or weight based adjustment of the mA/kV was utilized to reduce the radiation dose to as low as reasonably achievable. COMPARISON: 01/11/2024 CLINICAL HISTORY: Head trauma, minor (Age >= 65y). FINDINGS: BRAIN AND VENTRICLES: No acute hemorrhage. No evidence of acute infarct. No hydrocephalus. No extra-axial collection. No mass effect or midline shift. ORBITS: Status post bilateral lens replacement. SINUSES: No acute abnormality. SOFT TISSUES AND SKULL: No acute soft tissue abnormality. No skull fracture. IMPRESSION: 1. No acute intracranial abnormality. Electronically signed by: Donnice Mania MD 07/14/2024 01:33 PM EST RP Workstation: [id number]     Procedures   Medications Ordered in the ED - No data to display  Clinical Course as of 07/14/24 1554  Tue Jul 14, 2024  1350 Patient to ED after fall at home. Brief LOC and intermittent AMS with EMS reported period of unresponsiveness. On arrival he is awake, slow to respond but follows  command, oriented, provides history that is later corroborated by spouse. Labs pending, imaging ordered, EKG. [SU]  1422 Recheck: he is awake, conversing with wife, smiling, active.  [SU]  1552 CT imaging negative, no rib fractures. Labs [place]. EKG sinus. He is seen by Dr. Yolande.   Collar removed by me. He ambulates in the room without dizziness. Gait is baseline per spouse. He is felt appropriate for discharge home. All questions answered.  [SU]    Clinical Course User Index [SU] Odell Balls, PA-C                                 Medical Decision Making Amount and/or Complexity of Data Reviewed Labs: ordered. Radiology: ordered.        Final diagnoses:  Fall, initial encounter  Contusion of face, initial encounter  Concussion with loss of consciousness of 30 minutes or less, initial encounter    ED Discharge Orders     None          Odell Balls, PA-C 07/14/24 1554  Alex Lamar BROCKS, MD 07/21/24 [phone]  "
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
## 2024-07-14 NOTE — Discharge Instructions (Signed)
 As we discussed, your xrays did not show any significant injury or broken bone. You can go home but should follow up closely with your doctor for recheck in 2-3 days. Return to the ED with any new or concerning symptoms at any time. Tylenol  for any soreness.
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
## 2024-08-12 ENCOUNTER — Emergency Department (HOSPITAL_COMMUNITY)

## 2024-08-12 ENCOUNTER — Observation Stay (HOSPITAL_COMMUNITY)
Admission: EM | Admit: 2024-08-12 | Discharge: 2024-08-15 | Disposition: A | Attending: Emergency Medicine | Admitting: Emergency Medicine

## 2024-08-12 ENCOUNTER — Other Ambulatory Visit: Payer: Self-pay

## 2024-08-12 DIAGNOSIS — R079 Chest pain, unspecified: Principal | ICD-10-CM | POA: Diagnosis present

## 2024-08-12 DIAGNOSIS — H409 Unspecified glaucoma: Secondary | ICD-10-CM | POA: Diagnosis not present

## 2024-08-12 DIAGNOSIS — K859 Acute pancreatitis without necrosis or infection, unspecified: Secondary | ICD-10-CM | POA: Insufficient documentation

## 2024-08-12 DIAGNOSIS — I7121 Aneurysm of the ascending aorta, without rupture: Secondary | ICD-10-CM | POA: Insufficient documentation

## 2024-08-12 DIAGNOSIS — G8929 Other chronic pain: Secondary | ICD-10-CM | POA: Diagnosis not present

## 2024-08-12 DIAGNOSIS — Z79899 Other long term (current) drug therapy: Secondary | ICD-10-CM | POA: Insufficient documentation

## 2024-08-12 DIAGNOSIS — D869 Sarcoidosis, unspecified: Secondary | ICD-10-CM | POA: Diagnosis present

## 2024-08-12 DIAGNOSIS — M549 Dorsalgia, unspecified: Secondary | ICD-10-CM

## 2024-08-12 DIAGNOSIS — Z96652 Presence of left artificial knee joint: Secondary | ICD-10-CM | POA: Insufficient documentation

## 2024-08-12 DIAGNOSIS — M47819 Spondylosis without myelopathy or radiculopathy, site unspecified: Secondary | ICD-10-CM | POA: Insufficient documentation

## 2024-08-12 DIAGNOSIS — E785 Hyperlipidemia, unspecified: Secondary | ICD-10-CM | POA: Diagnosis not present

## 2024-08-12 DIAGNOSIS — R0789 Other chest pain: Principal | ICD-10-CM | POA: Insufficient documentation

## 2024-08-12 DIAGNOSIS — R7989 Other specified abnormal findings of blood chemistry: Secondary | ICD-10-CM | POA: Insufficient documentation

## 2024-08-12 DIAGNOSIS — R0602 Shortness of breath: Secondary | ICD-10-CM | POA: Insufficient documentation

## 2024-08-12 DIAGNOSIS — M5459 Other low back pain: Secondary | ICD-10-CM | POA: Insufficient documentation

## 2024-08-12 DIAGNOSIS — R519 Headache, unspecified: Secondary | ICD-10-CM | POA: Diagnosis not present

## 2024-08-12 DIAGNOSIS — Z85828 Personal history of other malignant neoplasm of skin: Secondary | ICD-10-CM | POA: Diagnosis not present

## 2024-08-12 DIAGNOSIS — I5A Non-ischemic myocardial injury (non-traumatic): Secondary | ICD-10-CM | POA: Insufficient documentation

## 2024-08-12 LAB — COMPREHENSIVE METABOLIC PANEL WITH GFR
ALT: 20 U/L (ref 0–44)
AST: 45 U/L — ABNORMAL HIGH (ref 15–41)
Albumin: 3.8 g/dL (ref 3.5–5.0)
Alkaline Phosphatase: 54 U/L (ref 38–126)
Anion gap: 11 (ref 5–15)
BUN: 17 mg/dL (ref 8–23)
CO2: 26 mmol/L (ref 22–32)
Calcium: 9.2 mg/dL (ref 8.9–10.3)
Chloride: 105 mmol/L (ref 98–111)
Creatinine, Ser: 0.67 mg/dL (ref 0.61–1.24)
GFR, Estimated: >60 mL/min
Glucose, Bld: 77 mg/dL (ref 70–99)
Potassium: 3.9 mmol/L (ref 3.5–5.1)
Sodium: 141 mmol/L (ref 135–145)
Total Bilirubin: 0.8 mg/dL (ref 0.0–1.2)
Total Protein: 8.8 g/dL — ABNORMAL HIGH (ref 6.5–8.1)

## 2024-08-12 LAB — CBC WITH DIFFERENTIAL/PLATELET
Abs Immature Granulocytes: 0.01 10*3/uL (ref 0.00–0.07)
Basophils Absolute: 0 10*3/uL (ref 0.0–0.1)
Basophils Relative: 0 %
Eosinophils Absolute: 0.1 10*3/uL (ref 0.0–0.5)
Eosinophils Relative: 2 %
HCT: 40.3 % (ref 39.0–52.0)
Hemoglobin: 13.6 g/dL (ref 13.0–17.0)
Immature Granulocytes: 0 %
Lymphocytes Relative: 33 %
Lymphs Abs: 1.7 10*3/uL (ref 0.7–4.0)
MCH: 29.8 pg (ref 26.0–34.0)
MCHC: 33.7 g/dL (ref 30.0–36.0)
MCV: 88.2 fL (ref 80.0–100.0)
Monocytes Absolute: 0.8 10*3/uL (ref 0.1–1.0)
Monocytes Relative: 15 %
Neutro Abs: 2.5 10*3/uL (ref 1.7–7.7)
Neutrophils Relative %: 50 %
Platelets: 220 10*3/uL (ref 150–400)
RBC: 4.57 MIL/uL (ref 4.22–5.81)
RDW: 13.5 % (ref 11.5–15.5)
WBC: 5.1 10*3/uL (ref 4.0–10.5)
nRBC: 0 % (ref 0.0–0.2)

## 2024-08-12 LAB — TROPONIN T, HIGH SENSITIVITY
Troponin T High Sensitivity: 63 ng/L — ABNORMAL HIGH (ref 0–19)
Troponin T High Sensitivity: 69 ng/L — ABNORMAL HIGH (ref 0–19)

## 2024-08-12 NOTE — ED Triage Notes (Signed)
 Pt presents to ED reporting severe chest pain and shob. Pt is tearful and hyperventilating on arrival.  Assisted to wheelchair and vital signs obtained during pt registration.

## 2024-08-12 NOTE — ED Notes (Addendum)
 Pt arrives POV with complaints of centralized chest pressure for a week along with a headache. Pt states that he went to his doctor today and they sent him here. Pt is very upset in triage due to his doctor telling him that he could be having a heart attack.

## 2024-08-12 NOTE — ED Notes (Signed)
 Reevaluated in triage for ongoing chest pain. EKG completed.

## 2024-08-12 NOTE — ED Provider Triage Note (Signed)
 Emergency Medicine Provider Triage Evaluation Note  [name] , Padilla 79 y.o. male  was evaluated in triage.  Pt complains of chest pain.  Patient reports chest pain shortness of breath ongoing for the last several days that has now improved.  He was advised by his cardiologist to seek evaluation in the emergency department for further valuation.  He appears to see Padilla cardiologist through [facility].  He currently takes aspirin  but no other blood thinners.  Review of Systems  Positive: As above Negative: As above  Physical Exam  BP (!) 122/92   Pulse 96   Temp 98.7 F (37.1 C) (Oral)   Resp 16   SpO2 99%  Gen:   Awake, no distress   Resp:  Normal effort  MSK:   Moves extremities without difficulty  Other:    Medical Decision Making  Medically screening exam initiated at 6:06 PM.  Appropriate orders placed.  [name] was informed that the remainder of the evaluation will be completed by another provider, this initial triage assessment does not replace that evaluation, and the importance of remaining in the ED until their evaluation is complete.     Alex Ritter A, PA-C 08/12/24 1807

## 2024-08-13 ENCOUNTER — Observation Stay (HOSPITAL_COMMUNITY)

## 2024-08-13 ENCOUNTER — Telehealth: Payer: Self-pay | Admitting: Pulmonary Disease

## 2024-08-13 ENCOUNTER — Encounter (HOSPITAL_COMMUNITY): Payer: Self-pay | Admitting: Internal Medicine

## 2024-08-13 ENCOUNTER — Emergency Department (HOSPITAL_COMMUNITY)

## 2024-08-13 DIAGNOSIS — D869 Sarcoidosis, unspecified: Secondary | ICD-10-CM | POA: Diagnosis not present

## 2024-08-13 DIAGNOSIS — R519 Headache, unspecified: Secondary | ICD-10-CM | POA: Insufficient documentation

## 2024-08-13 DIAGNOSIS — G8929 Other chronic pain: Secondary | ICD-10-CM | POA: Diagnosis not present

## 2024-08-13 DIAGNOSIS — E785 Hyperlipidemia, unspecified: Secondary | ICD-10-CM | POA: Diagnosis not present

## 2024-08-13 DIAGNOSIS — R079 Chest pain, unspecified: Secondary | ICD-10-CM

## 2024-08-13 DIAGNOSIS — M5459 Other low back pain: Secondary | ICD-10-CM | POA: Diagnosis not present

## 2024-08-13 DIAGNOSIS — K859 Acute pancreatitis without necrosis or infection, unspecified: Secondary | ICD-10-CM | POA: Diagnosis not present

## 2024-08-13 DIAGNOSIS — H409 Unspecified glaucoma: Secondary | ICD-10-CM | POA: Diagnosis not present

## 2024-08-13 DIAGNOSIS — Z7952 Long term (current) use of systemic steroids: Secondary | ICD-10-CM | POA: Diagnosis not present

## 2024-08-13 DIAGNOSIS — R0789 Other chest pain: Secondary | ICD-10-CM | POA: Diagnosis not present

## 2024-08-13 DIAGNOSIS — R030 Elevated blood-pressure reading, without diagnosis of hypertension: Secondary | ICD-10-CM | POA: Diagnosis not present

## 2024-08-13 DIAGNOSIS — R7989 Other specified abnormal findings of blood chemistry: Secondary | ICD-10-CM | POA: Insufficient documentation

## 2024-08-13 DIAGNOSIS — I7121 Aneurysm of the ascending aorta, without rupture: Secondary | ICD-10-CM | POA: Diagnosis not present

## 2024-08-13 DIAGNOSIS — K5900 Constipation, unspecified: Secondary | ICD-10-CM | POA: Diagnosis not present

## 2024-08-13 DIAGNOSIS — M47819 Spondylosis without myelopathy or radiculopathy, site unspecified: Secondary | ICD-10-CM | POA: Diagnosis not present

## 2024-08-13 DIAGNOSIS — R0602 Shortness of breath: Secondary | ICD-10-CM | POA: Diagnosis not present

## 2024-08-13 DIAGNOSIS — M549 Dorsalgia, unspecified: Secondary | ICD-10-CM

## 2024-08-13 LAB — ECHOCARDIOGRAM COMPLETE
Area-P 1/2: 2.07 cm2
Calc EF: 42.7 %
Height: 71 in
P 1/2 time: 737 ms
S' Lateral: 3 cm
Single Plane A2C EF: 41.2 %
Single Plane A4C EF: 43.2 %
Weight: 2684.32 [oz_av]

## 2024-08-13 LAB — HEPARIN LEVEL (UNFRACTIONATED): Heparin Unfractionated: 0.31 [IU]/mL (ref 0.30–0.70)

## 2024-08-13 LAB — TROPONIN T, HIGH SENSITIVITY
Troponin T High Sensitivity: 75 ng/L — ABNORMAL HIGH (ref 0–19)
Troponin T High Sensitivity: 75 ng/L — ABNORMAL HIGH (ref 0–19)
Troponin T High Sensitivity: 80 ng/L — ABNORMAL HIGH (ref 0–19)

## 2024-08-13 LAB — C-REACTIVE PROTEIN: CRP: <0.5 mg/dL

## 2024-08-13 LAB — LIPASE, BLOOD: Lipase: 13 U/L (ref 11–51)

## 2024-08-13 LAB — SEDIMENTATION RATE: Sed Rate: 42 mm/h — ABNORMAL HIGH (ref 0–16)

## 2024-08-13 LAB — PRO BRAIN NATRIURETIC PEPTIDE: Pro Brain Natriuretic Peptide: <50 pg/mL

## 2024-08-13 MED ORDER — PERFLUTREN LIPID MICROSPHERE
1.0000 mL | INTRAVENOUS | Status: AC | PRN
  Administered 2024-08-13: 4 mL via INTRAVENOUS

## 2024-08-13 MED ORDER — ACETAMINOPHEN 500 MG PO TABS: 1000.0000 mg | ORAL_TABLET | Freq: Three times a day (TID) | ORAL | Status: DC | PRN

## 2024-08-13 MED ORDER — MORPHINE SULFATE (PF) 4 MG/ML IV SOLN
4.0000 mg | Freq: Once | INTRAVENOUS | Status: AC
  Administered 2024-08-13: 4 mg via INTRAVENOUS
  Filled 2024-08-13: qty 1

## 2024-08-13 MED ORDER — ASPIRIN 81 MG PO CHEW
324.0000 mg | CHEWABLE_TABLET | Freq: Once | ORAL | Status: AC
  Administered 2024-08-13: 324 mg via ORAL
  Filled 2024-08-13: qty 4

## 2024-08-13 MED ORDER — LATANOPROST 0.005 % OP SOLN
1.0000 [drp] | Freq: Every day | OPHTHALMIC | Status: DC
  Administered 2024-08-13 – 2024-08-14 (×2): 1 [drp] via OPHTHALMIC
  Filled 2024-08-13: qty 2.5

## 2024-08-13 MED ORDER — HEPARIN (PORCINE) 25000 UT/250ML-% IV SOLN
900.0000 [IU]/h | INTRAVENOUS | Status: DC
  Administered 2024-08-13: 900 [IU]/h via INTRAVENOUS
  Filled 2024-08-13: qty 250

## 2024-08-13 MED ORDER — TRAMADOL HCL 50 MG PO TABS: 50.0000 mg | ORAL_TABLET | Freq: Two times a day (BID) | ORAL | Status: DC | PRN

## 2024-08-13 MED ORDER — HEPARIN BOLUS VIA INFUSION
4000.0000 [IU] | Freq: Once | INTRAVENOUS | Status: AC
  Administered 2024-08-13: 4000 [IU] via INTRAVENOUS
  Filled 2024-08-13: qty 4000

## 2024-08-13 MED ORDER — TIMOLOL MALEATE 0.5 % OP SOLN
1.0000 [drp] | Freq: Two times a day (BID) | OPHTHALMIC | Status: DC
  Administered 2024-08-14 (×2): 1 [drp] via OPHTHALMIC
  Filled 2024-08-13: qty 5

## 2024-08-13 MED ORDER — BRIMONIDINE TARTRATE 0.2 % OP SOLN
1.0000 [drp] | Freq: Two times a day (BID) | OPHTHALMIC | Status: DC
  Administered 2024-08-13 – 2024-08-14 (×4): 1 [drp] via OPHTHALMIC
  Filled 2024-08-13: qty 5

## 2024-08-13 MED ORDER — TRAMADOL HCL 50 MG PO TABS
50.0000 mg | ORAL_TABLET | Freq: Four times a day (QID) | ORAL | Status: DC | PRN
  Administered 2024-08-13: 50 mg via ORAL
  Filled 2024-08-13: qty 1

## 2024-08-13 MED ORDER — IOHEXOL 350 MG/ML SOLN
75.0000 mL | Freq: Once | INTRAVENOUS | Status: AC | PRN
  Administered 2024-08-13: 75 mL via INTRAVENOUS

## 2024-08-13 MED ORDER — ENOXAPARIN SODIUM 40 MG/0.4ML IJ SOSY
40.0000 mg | PREFILLED_SYRINGE | INTRAMUSCULAR | Status: DC
  Administered 2024-08-14: 40 mg via SUBCUTANEOUS
  Filled 2024-08-13: qty 0.4

## 2024-08-13 MED ORDER — CYCLOBENZAPRINE HCL 5 MG PO TABS
5.0000 mg | ORAL_TABLET | Freq: Three times a day (TID) | ORAL | Status: DC | PRN
  Administered 2024-08-13: 5 mg via ORAL
  Filled 2024-08-13: qty 1

## 2024-08-13 MED ORDER — PREDNISONE 1 MG PO TABS
3.0000 mg | ORAL_TABLET | Freq: Every day | ORAL | Status: DC
  Administered 2024-08-13 – 2024-08-14 (×2): 3 mg via ORAL
  Filled 2024-08-13 (×3): qty 3

## 2024-08-13 MED ORDER — ONDANSETRON HCL 4 MG/2ML IJ SOLN
4.0000 mg | Freq: Once | INTRAMUSCULAR | Status: AC
  Administered 2024-08-13: 4 mg via INTRAVENOUS
  Filled 2024-08-13: qty 2

## 2024-08-13 MED ORDER — DORZOLAMIDE HCL 2 % OP SOLN
1.0000 [drp] | Freq: Two times a day (BID) | OPHTHALMIC | Status: DC
  Administered 2024-08-14 (×2): 1 [drp] via OPHTHALMIC
  Filled 2024-08-13 (×2): qty 10

## 2024-08-13 NOTE — ED Notes (Signed)
 Pt states that he is about to have another episode. Pt is brought back to triage, and pt begins to breathe quickly. He beings shouting that he needs help.

## 2024-08-13 NOTE — Progress Notes (Signed)
 Echocardiogram 2D Echocardiogram has been performed.  [name] 08/13/2024, 3:46 PM

## 2024-08-13 NOTE — Consult Note (Signed)
 "  Cardiology Consultation   Patient ID: [name] MRN: [id number]; DOB: 1946-01-13  Admit date: 08/12/2024 Date of Consult: 08/13/2024  PCP:  [name], MD   [facility] Providers Cardiologist:  None        Patient Profile: [name] is a 79 y.o. male with a hx of sarcoidosis, lumbar radiculopathy and chronic low back pain, glaucoma, chronic hepatitis C, autoimmune pancreatitis on long-term steroid therapy, s/p left knee replacement in 2023 who is being seen 08/13/2024 for the evaluation of chest pain at the request of Dr. Elsie Savannah.  History of Present Illness: [name] has medical history as stated above. Does not follow with outpatient cardiology per chart. Seen in the ED in June 2025 for atypical chest pain and headache after an intense argument with a family member. Negative troponin x 2. Discharged home with referral for cardiology follow-up. Seen by [facility] PCP in January 2026 for exacerbation of chronic low back pain then radiating to mid/upper back and chest. Unremarkable EKG consistent with baseline per OV note.  Presently, patient presented on 1/28 complaining of severe chest pain and dyspnea. Tearful and hyperventilating on arrival. Patient states that he has been having intermittent chest pain for the past week which occurs mostly at night. Seen by [facility] outpatient cardiologist earlier that afternoon as a new patient and was sent to the ER for further evaluation of his chest pain. Workup notable for hs-troponin 63, 69, 75, 75. Serial EKGs showed sinus rhythm, no significant ischemic changes. Chest CTA notable for mild cardiomegaly, mild calcific CAD, and dilated main pulmonary arteries bilaterally. Normal proBNP. Largely unremarkable CBC and CMP. Given ASA 324 mg, morphine , Zofran , IV heparin  bolus in the ED and started on heparin  drip.   Upon speaking to the patient today, he states that his chest pain has been intermittent for the past 1-2  weeks and describes it as a severe left-sided sharp stabbing pain with occasional radiation to the left neck and shoulder when the pain is at its most severe. Also endorses tension-like headache at times. Denies any associated dyspnea, palpitations, diaphoresis, N/V. Only occurs at night and while laying flat, wakes him from his sleep crying and yelling due to such severity. States that he typically takes a dose of tramadol  and will sometimes walk around his house or sit upright during these episodes of chest pain, which typically resolve after 15-20 minutes. Notes that he will have multiple episodes per night, significantly affecting his sleep lately. No history of ischemic workup or cardiac issues. History of sarcoidosis but has not been following with pulmonologist since 2018. States that he has chronic lower back pain that occasionally radiates to his mid/upper back and chest but that his recent chest pain these past couple weeks are different in severity and character. States that he had 3 episodes of chest pain overnight but none so far today.   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  patient denies accompanying loc or dizzines prior fall,   Glaucoma    30% of my  vision is gone, i ttake 4 different types of eye drops    Hematuria    History of GI bleed    upper GI bleed 07-20-2001  per EGD bleeding coming from pyloic channel/  09-02-2010 upper GI bleed -- per EGD erosive mucosa esophagastric region    History of positive hepatitis C dx 06-03-2014   Genotype 1A (F0 to F1)  completed harvoni   treatment 03/ 2016 and nondetectalbe   History of squamous cell carcinoma excision    09-16-2007  re-excision lesion back / neck area  ,  low grade   Pancreatic mass 12/12/2016   found on CT for hematuria work up   Pulmonary sarcoidosis pulmologist-  dr clance/ dr lonna december   dx age 70/  confirmed dx via endobronchial bx RLL 09-15-2013    Past Surgical History:  Procedure Laterality Date   CYSTOSCOPY WITH BIOPSY N/A 01/04/2017   Procedure: CYSTOSCOPY WITH BIOPSY AND FULGURATION;  Surgeon: Nieves Cough, MD;  Location: [facility];  Service: Urology;  Laterality: N/A;   CYSTOSCOPY WITH BIOPSY N/A 02/13/2019   Procedure: CYSTOSCOPY WITH /BLADDER BIOPSY/ FULGURATION;  Surgeon: Nieves Cough, MD;  Location: [facility];  Service: Urology;  Laterality: N/A;   ESOPHAGOGASTRODUODENOSCOPY  last one 09-05-2010   HERNIA REPAIR  1982   RE-EXCISION LESION BACK/ NECK AREA  09/16/2007   VIDEO BRONCHOSCOPY Bilateral 09/15/2013   Procedure: VIDEO BRONCHOSCOPY WITH FLUORO;  Surgeon: Francis CHRISTELLA Dresser, MD;  Location: [facility];  Service: Cardiopulmonary;  Laterality: Bilateral;   VIDEO BRONCHOSCOPY Bilateral 12/16/2013   Procedure: VIDEO BRONCHOSCOPY WITHOUT FLUORO;  Surgeon: Francis CHRISTELLA Dresser, MD;  Location: [facility];  Service: Cardiopulmonary;  Laterality: Bilateral;       Scheduled Meds:  brimonidine   1 drop Right Eye BID   dorzolamide   1 drop Right Eye BID   latanoprost   1 drop Both Eyes QHS   predniSONE   3 mg Oral Daily   timolol   1 drop Both Eyes BID   Continuous Infusions:   PRN Meds: acetaminophen , cyclobenzaprine , traMADol   Allergies:   Allergies[1]  Social History:   Social History   Socioeconomic History   Marital status: Married    Spouse name: Not on file   Number of children: Not on file   Years of education: Not on file   Highest education level: Not on file  Occupational History   Not on file  Tobacco Use   Smoking status: Never   Smokeless  tobacco: Never  Vaping Use   Vaping status: Never Used  Substance and Sexual Activity   Alcohol use: No   Drug use: No   Sexual activity: Not on file  Other Topics Concern   Not on file  Social History Narrative   Are you right handed or left handed? Right   Are you currently employed ? no   What is your current occupation? Retired   Do you live at home alone? no   Who lives with you? wife   What type of home do you live in: 1 story or 2 story? 1    Caffeine 2 glasses of tea   Social Drivers of Health   Tobacco Use: Low Risk (08/13/2024)   Patient History    Smoking Tobacco Use: Never    Smokeless Tobacco Use: Never    Passive Exposure: Not on file  Financial Resource Strain: Low Risk (11/26/2023)   Received from [facility]   Overall Financial Resource Strain (CARDIA)    Difficulty of  Paying Living Expenses: Not hard at all  Food Insecurity: No Food Insecurity (08/13/2024)   Epic    Worried About Programme Researcher, Broadcasting/film/video in the Last Year: Never true    Ran Out of Food in the Last Year: Never true  Transportation Needs: No Transportation Needs (08/13/2024)   Epic    Lack of Transportation (Medical): No    Lack of Transportation (Non-Medical): No  Physical Activity: Sufficiently Active (11/26/2023)   Received from [facility]   Exercise Vital Sign    On average, how many days per week do you engage in moderate to strenuous exercise (like a brisk walk)?: 3 days    On average, how many minutes do you engage in exercise at this level?: 50 min  Stress: No Stress Concern Present (11/26/2023)   Received from [facility] - Occupational Stress Questionnaire    Feeling of Stress : Not at all  Social Connections: Socially Integrated (08/13/2024)   Social Connection and Isolation Panel    Frequency of Communication with Friends and Family: More than three times a week    Frequency of Social Gatherings with Friends and Family: More than three  times a week    Attends Religious Services: More than 4 times per year    Active Member of [employer] or Organizations: Yes    Attends Banker Meetings: More than 4 times per year    Marital Status: Married  Catering Manager Violence: Not At Risk (08/13/2024)   Epic    Fear of Current or Ex-Partner: No    Emotionally Abused: No    Physically Abused: No    Sexually Abused: No  Depression (PHQ2-9): Not on file  Alcohol Screen: Not on file  Housing: Low Risk (08/13/2024)   Epic    Unable to Pay for Housing in the Last Year: No    Number of Times Moved in the Last Year: 0    Homeless in the Last Year: No  Utilities: Not At Risk (08/13/2024)   Epic    Threatened with loss of utilities: No  Health Literacy: Not on file    Family History:    Family History  Problem Relation Age of Onset   Hypertension Mother    Heart failure Father    Stroke Sister    Cancer - Prostate Brother      ROS:  Please see the history of present illness.   All other ROS reviewed and negative.     Physical Exam/Data: Vitals:   08/13/24 1554 08/13/24 1556 08/13/24 1557 08/13/24 1652  BP:  (!) 142/80  (!) 134/92  Pulse: 86 61 62 78  Resp: 17 (!) 21 16 18   Temp:    98.3 F (36.8 C)  TempSrc:    Oral  SpO2: 100% 100% 99% 100%  Weight:    75.3 kg  Height:    5' 11 (1.803 m)    Intake/Output Summary (Last 24 hours) at 08/13/2024 1754 Last data filed at 08/13/2024 1707 Gross per 24 hour  Intake 102.82 ml  Output --  Net 102.82 ml      08/13/2024    4:52 PM 08/13/2024    6:25 AM 01/11/2024   12:35 PM  Last 3 Weights  Weight (lbs) 166 lb 0.1 oz 167 lb 12.3 oz 175 lb  Weight (kg) 75.3 kg 76.1 kg 79.379 kg     Body mass index is 23.15 kg/m.  General: Frail appearing elderly male,  resting in bed on room air, in no acute distress Neck: No JVD Vascular: Distal pulses 2+ bilaterally Cardiac: Normal S1, S2; RRR; no murmur Lungs: Clear to auscultation bilaterally, no wheezing, rhonchi or  rales  Abd: Soft, nontender Ext: No peripheral edema Musculoskeletal: No deformities Skin: Warm and dry  Neuro: No focal abnormalities noted Psych: Normal affect   EKG: The EKG was personally reviewed and demonstrates: Sinus rhythm, no significant ST-T wave changes Telemetry: Telemetry was personally reviewed and demonstrates: Sinus rhythm, rates in the 60s  Relevant CV Studies:  CTA chest PE [08/13/24]: PULMONARY ARTERIES: Pulmonary arteries are adequately opacified for evaluation. There is no evidence of pulmonary embolus. The main pulmonary arteries are dilated, measuring approximately 3.0 cm in diameter bilaterally MEDIASTINUM: The heart is mildly enlarged and there is mild calcific coronary artery disease. There is no acute abnormality of the thoracic aorta.  Laboratory Data: High Sensitivity Troponin:  No results for input(s): TROPONINIHS in the last 720 hours.  Recent Labs  Lab 08/12/24 1833 08/12/24 2127 08/13/24 1044  TRNPT 63* 69* 75*  75*      Chemistry Recent Labs  Lab 08/12/24 1833  NA 141  K 3.9  CL 105  CO2 26  GLUCOSE 77  BUN 17  CREATININE 0.67  CALCIUM 9.2  GFRNONAA >60  ANIONGAP 11    Recent Labs  Lab 08/12/24 1833  PROT 8.8*  ALBUMIN 3.8  AST 45*  ALT 20  ALKPHOS 54  BILITOT 0.8   Lipids No results for input(s): CHOL, TRIG, HDL, LABVLDL, LDLCALC, CHOLHDL in the last 168 hours.  Hematology Recent Labs  Lab 08/12/24 1833  WBC 5.1  RBC 4.57  HGB 13.6  HCT 40.3  MCV 88.2  MCH 29.8  MCHC 33.7  RDW 13.5  PLT 220   Thyroid  No results for input(s): TSH, FREET4 in the last 168 hours.  BNP Recent Labs  Lab 08/13/24 1044  PROBNP <50.0    DDimer No results for input(s): DDIMER in the last 168 hours.  Radiology/Studies:  CT HEAD W & WO CONTRAST ( ) Result Date: 08/13/2024 EXAM: CT HEAD WITHOUT AND WITH CONTRAST 08/13/2024 04:22:57 PM TECHNIQUE: CT of the head was performed without and with the administration of  intravenous contrast. 75 mL iohexol  (OMNIPAQUE ) 350 MG/ML injection was administered. Automated exposure control, iterative reconstruction, and/or weight based adjustment of the mA/kV was utilized to reduce the radiation dose to as low as reasonably achievable. COMPARISON: Head CT 07/14/2024 and MRI 09/26/2022. CLINICAL HISTORY: Headache, increasing frequency or severity. FINDINGS: BRAIN AND VENTRICLES: There is no evidence of an acute infarct, intracranial hemorrhage, mass, midline shift, hydrocephalus, or extra-axial fluid collection. Cerebral volume is normal. No abnormal enhancement is identified. The major dural venous sinuses appear patent. ORBITS: Bilateral cataract extraction. SINUSES AND MASTOIDS: No acute abnormality. SOFT TISSUES AND SKULL: No focal bone lesion. No acute soft tissue abnormality. IMPRESSION: 1. Negative contrast enhanced head CT. Electronically signed by: Dasie Hamburg MD 08/13/2024 05:16 PM EST RP Workstation: [id number]   CT Angio Chest Pulmonary Embolism (PE) W or WO Contrast Result Date: 08/13/2024 EXAM: CTA CHEST 08/13/2024 05:52:47 AM TECHNIQUE: CTA of the chest was performed without and with the administration of 75 mL of iohexol  (OMNIPAQUE ) 350 MG/ML injection. Multiplanar reformatted images are provided for review. MIP images are provided for review. Automated exposure control, iterative reconstruction, and/or weight based adjustment of the mA/kV was utilized to reduce the radiation dose to as low as reasonably achievable. COMPARISON: CT of the chest  dated 10/25/2021. CLINICAL HISTORY: Pulmonary embolism (PE) suspected, high probability. FINDINGS: PULMONARY ARTERIES: Pulmonary arteries are adequately opacified for evaluation. There is no evidence of pulmonary embolus. The main pulmonary arteries are dilated, measuring approximately 3.0 cm in diameter bilaterally. MEDIASTINUM: The heart is mildly enlarged and there is mild calcific coronary artery disease. There is no acute  abnormality of the thoracic aorta. LYMPH NODES: There are calcified mediastinal and bilateral hilar lymph nodes. No axillary lymphadenopathy. LUNGS AND PLEURA: There are innumerable miliary nodular opacities present within the lungs bilaterally, significantly worse on the right. There are patchy and ground-glass opacities present within the lungs bilaterally, primarily dependently and also worse on the right. No focal consolidation or pulmonary edema. No evidence of pleural effusion or pneumothorax. The most likely differential diagnoses for the miliary nodular opacities include disseminated infection (e.g., tuberculosis, fungal infection), sarcoidosis, or metastatic disease. Follow-up imaging, such as a dedicated high-resolution CT (HRCT) of the chest, may be considered for further characterization. UPPER ABDOMEN: Limited images of the upper abdomen are unremarkable. SOFT TISSUES AND BONES: No acute bone or soft tissue abnormality. IMPRESSION: 1. No evidence of pulmonary embolus. 2. Interval worsening of hazy, patchy opacification slash consolidation of the lung bases, worse on the right and chronic underlying miliary nodular opacities and patchy ground-glass opacities bilaterally, worse on the right. Differential considerations include miliary infection (tuberculosis or fungal) and less likely inflammatory pneumoconiosis/hypersensitivity pneumonitis. 3. Calcified mediastinal and bilateral hilar lymph nodes, most consistent with prior granulomatous disease. 4. Dilated main pulmonary arteries bilaterally, which can be seen with pulmonary arterial hypertension. 5. Mildly enlarged heart and mild calcific coronary artery disease. Electronically signed by: Evalene Coho MD 08/13/2024 06:38 AM EST RP Workstation: [id number]   DG Chest 1 View Result Date: 08/12/2024 EXAM: 1 VIEW(S) XRAY OF THE CHEST 08/12/2024 07:14:28 PM COMPARISON: 07/14/2024 CLINICAL HISTORY: Shortness of breath. FINDINGS: LUNGS AND PLEURA:  Stable chronic nodular opacities demonstrating an upper lobe predominance, compatible with patient's known history of sarcoidosis. No superimposed focal consolidation. No pleural effusion. No pneumothorax. HEART AND MEDIASTINUM: No acute abnormality of the cardiac and mediastinal silhouettes. BONES AND SOFT TISSUES: No acute osseous abnormality. IMPRESSION: 1. No superimposed focal consolidation. 2. Stable chronic nodular opacities with upper lobe predominance, compatible with known sarcoidosis. Electronically signed by: Dorethia Molt MD 08/12/2024 07:22 PM EST RP Workstation: [id number]     Assessment and Plan: Chest pain Elevated troponins Presents with atypical chest pain occurring mostly at night while laying flat and alleviated with Tramadol  and walking around the house Hs-troponins elevated but trending flat 63, 69, 75, 75 Serial EKGs showed sinus rhythm, no significant ST-T wave changes Chest CTA showed mild cardiomegaly, mild calcific CAD, and dilated main pulmonary arteries bilaterally CTA chest also notable for interval worsening of opacification/consolidation of lung bases and chronic underlying miliary nodular opacities and patchy ground-glass opacities bilaterally. Concern for opportunistic infection in patient with years of chronic steroid therapy, although he is without respiratory symptoms. Of note, history of positive QuantiFERON gold in 2015 but underwent bronchoscopy with biopsy and AFB stains were negative Low suspicion for ACS due to atypical chest pain with reassuring serial EKGs and troponin trend. Nonetheless, elevated troponins suggestive of some level of myocardial injury. Echo pending. Ordered ESR and CRP. Will also consider cardiac stress MRI for further evaluation of cardiac structure, function, and perfusion  Pulmonary sarcoidosis Chronic steroid therapy Has been on prednisone  for several years, had dyspnea when tried to wean in the past Previously followed by  pulmonology  but lost to follow up in 2018 Low suspicion for cardiac involvement due to atypical presentation, serial EKGs without conduction abnormalities or dysrhythmias. Pending formal echo read Continue home prednisone  3 mg daily  Hyperlipidemia Most recent lipid panel 06/05/2024 by PCP showed total cholesterol 164, HDL 34, LDL 115 Not on statin or other cholesterol-lowering medication due to patient preference  Per primary Glaucoma Chronic pain management Chronic hepatitis C Autoimmune pancreatitis   Risk Assessment/Risk Scores:      For questions or updates, please contact [facility] Please consult [email or web address] for contact info under      Signed, Owen MARLA Daniels, PA-C  08/13/2024 5:54 PM     [1]  Allergies Allergen Reactions   Gabapentin Nausea Only and Other (See Comments)    Dizziness   Penicillins Other (See Comments)    Unknown reaction 30+years ago    "

## 2024-08-13 NOTE — Hospital Course (Addendum)
 SABRA

## 2024-08-13 NOTE — H&P (Signed)
 " Date: 08/13/2024               Patient Name:  [name] MRN: [id number]  DOB: 1946-03-31 Age / Sex: 79 y.o., male   PCP: [name], MD         Medical Service: Internal Medicine Teaching Service         Attending Physician: Dr. Francesco Elsie NOVAK, MD      First Contact: Dr. Letha Cheadle, MD    Second Contact: Dr. Missy Sandhoff, MD         After Hours (After 5p/  First Contact Pager: [phone]  weekends / holidays): Second Contact Pager: [phone]   SUBJECTIVE   Chief Complaint: Chest pain  History of Present Illness:  [name] is a 79 year old male with a past medical history of pulmonary sarcoidosis, glaucoma, chronic pain, and prior hepatitis C treated in 2016 presenting with 2-3 weeks of intermittent chest pain.  He reports left-sided chest/shoulder pain that is sharp and made worse when he lays on his left side.  This is different than his chronic back pain.  He notes associated headaches on the left side of his head that are associated with blurry vision and some lightheadedness.  He does have chronic headaches that occur weekly but he does not remember these being unilateral however they have been associated with blurry vision and dizziness in the past occasionally.  He has had problems with orthostatic dizziness without any recent falls or worsening since the onset of his chest pain.  He has noticed that he is urinating more often with urinary frequency, urgency, and occasional urgency incontinence for at least the past week.  He has had longstanding issues with intermittent constipation, intermittently bleeding hemorrhoids, and occasional fecal incontinence that sounds like overflow incontinence.  He is supposed to get an outpatient colonoscopy next month.  He denies any significant dyspnea, worsening cough, hemoptysis, abdominal pain, nausea, vomiting, or change of bowel habits.  ED Course: Presented as above with normal vital signs and satting well on room air.   Metabolic panel showed stable mild elevation in AST at 45 and mildly elevated total protein at 8.8 also stable from 07/15/2024.  CBC with differential within normal limits.  Troponin 63 and increased to 69.  No significant changes on EKG.  Chest x-ray showed stable chronic nodular opacities with upper lobe predominance and CTA of the chest for PE showed no PE but possible interval worsening of consolidations in the lung bases.  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 on chest imaging or bronchoscopy with biopsy in 2015 Pancreatic head mass with elevated IgG and negative biopsy 2018, consistent with autoimmune pancreatitis  Meds:  Brimonidine  eyedrops twice daily Cyclobenzaprine  5 mg daily as needed Dorzolamide  eyedrops twice daily Latanoprost  eyedrops nightly Meclizine  25 mg 3 times daily as needed, last took over a year ago Prednisone  3 mg daily Timolol  eyedrops twice daily Tramadol  50 mg every 6 hours as needed  Past Surgical History Past Surgical History:  Procedure Laterality Date   CYSTOSCOPY WITH BIOPSY N/A 01/04/2017   Procedure: CYSTOSCOPY WITH BIOPSY AND FULGURATION;  Surgeon: Nieves Cough, MD;  Location: [facility];  Service: Urology;  Laterality: N/A;   CYSTOSCOPY WITH BIOPSY N/A 02/13/2019   Procedure: CYSTOSCOPY WITH /BLADDER BIOPSY/ FULGURATION;  Surgeon: Nieves Cough, MD;  Location: [facility];  Service: Urology;  Laterality: N/A;   ESOPHAGOGASTRODUODENOSCOPY  last one 09-05-2010   HERNIA REPAIR  1982   RE-EXCISION LESION BACK/ NECK AREA  09/16/2007   VIDEO BRONCHOSCOPY Bilateral 09/15/2013   Procedure: VIDEO BRONCHOSCOPY WITH FLUORO;  Surgeon: Francis CHRISTELLA Dresser, MD;  Location: [facility];  Service: Cardiopulmonary;  Laterality: Bilateral;   VIDEO BRONCHOSCOPY Bilateral 12/16/2013   Procedure: VIDEO BRONCHOSCOPY WITHOUT FLUORO;  Surgeon: Francis CHRISTELLA Dresser,  MD;  Location: [facility];  Service: Cardiopulmonary;  Laterality: Bilateral;    Social:  Lives with wife and dog at home.  Is independent in ADLs and IADLs.  Ambulates with a cane. PCP: [name], MD Substances: Denies tobacco, alcohol, or drug use.  Family History:  Family History  Problem Relation Age of Onset   Hypertension Mother    Heart failure Father    Stroke Sister    Cancer - Prostate Brother     Allergies: Allergies as of 08/12/2024 - Review Complete 08/12/2024  Allergen Reaction Noted   Gabapentin Nausea Only 11/08/2020   Penicillins Other (See Comments) 08/24/2011    Review of Systems: A complete ROS was negative except as per HPI.   OBJECTIVE:   Physical Exam: Blood pressure 121/71, pulse 72, temperature 98.1 F (36.7 C), temperature source Oral, resp. rate 16, height 5' 11 (1.803 m), weight 76.1 kg, SpO2 100%.  Constitutional: Chronically ill-appearing elderly male laying in bed. In no acute distress. HENT: Normocephalic, atraumatic,  Eyes: Sclera non-icteric, PERRL, EOM intact Neck: No spinal or paraspinal muscle tenderness in the cervical spine Cardio:Regular rate and rhythm. 2+ bilateral radial pulses.  Mildly cool bilateral feet.  Dopplerable bilateral pedal pulses. Pulm: Coarse breath sounds most prominent in the bases. Normal work of breathing on room air. Abdomen: Soft, non-tender, non-distended, positive bowel sounds. FDX:Wzhjupcz for extremity edema.  No reproduction of chest pain on palpation. Skin:Warm and dry. Neuro:Alert and oriented x3.  Bilateral lower extremity weakness with hip flexion, otherwise no focal deficit noted. Psych:Pleasant mood and affect.  Labs: CBC    Component Value Date/Time   WBC 5.1 08/12/2024 1833   RBC 4.57 08/12/2024 1833   HGB 13.6 08/12/2024 1833   HCT 40.3 08/12/2024 1833   PLT 220 08/12/2024 1833   MCV 88.2 08/12/2024 1833   MCH 29.8 08/12/2024 1833   MCHC 33.7 08/12/2024 1833   RDW 13.5  08/12/2024 1833   LYMPHSABS 1.7 08/12/2024 1833   MONOABS 0.8 08/12/2024 1833   EOSABS 0.1 08/12/2024 1833   BASOSABS 0.0 08/12/2024 1833     CMP     Component Value Date/Time   NA 141 08/12/2024 1833   K 3.9 08/12/2024 1833   CL 105 08/12/2024 1833   CO2 26 08/12/2024 1833   GLUCOSE 77 08/12/2024 1833   BUN 17 08/12/2024 1833   CREATININE 0.67 08/12/2024 1833   CREATININE 0.77 07/28/2014 1119   CALCIUM 9.2 08/12/2024 1833   PROT 8.8 (H) 08/12/2024 1833   ALBUMIN 3.8 08/12/2024 1833   ALBUMIN 3.4 (L) 08/02/2022 0959   AST 45 (H) 08/12/2024 1833   ALT 20 08/12/2024 1833   ALKPHOS 54 08/12/2024 1833   BILITOT 0.8 08/12/2024 1833   GFRNONAA >60 08/12/2024 1833   GFRNONAA >89 06/03/2014 0948   GFRAA >60 02/12/2019 1505   GFRAA >89 06/03/2014 0948    Imaging: CT Angio Chest Pulmonary Embolism (PE) W or WO Contrast Result Date: 08/13/2024 IMPRESSION: 1. No evidence of pulmonary embolus. 2. Interval worsening of hazy, patchy opacification slash consolidation of the lung bases, worse on the right and  chronic underlying miliary nodular opacities and patchy ground-glass opacities bilaterally, worse on the right. Differential considerations include miliary infection (tuberculosis or fungal) and less likely inflammatory pneumoconiosis/hypersensitivity pneumonitis. 3. Calcified mediastinal and bilateral hilar lymph nodes, most consistent with prior granulomatous disease. 4. Dilated main pulmonary arteries bilaterally, which can be seen with pulmonary arterial hypertension. 5. Mildly enlarged heart and mild calcific coronary artery disease. Electronically signed by: Evalene Coho MD 08/13/2024 06:38 AM EST RP Workstation: [id number]   DG Chest 1 View Result Date: 08/12/2024 IMPRESSION: 1. No superimposed focal consolidation. 2. Stable chronic nodular opacities with upper lobe predominance, compatible with known sarcoidosis. Electronically signed by: Dorethia Molt MD 08/12/2024 07:22 PM EST  RP Workstation: [id number]     EKG: personally reviewed my interpretation is normal sinus rhythm. Consistent with prior EKG.  ASSESSMENT & PLAN:   Assessment & Plan by Problem: Principal Problem:   Chest pain Active Problems:   Sarcoidosis   Headache   Elevated troponin   IZAAK SAHR is a 79 y.o. male with pertinent PMH of pulmonary sarcoidosis, glaucoma, chronic pain, and prior hepatitis C treated in 2016 who presented with intermittent chest pain and is admitted for further workup of chest pain with elevated troponins.  Chest pain Elevated troponin Chest pain is overall atypical without EKG changes but troponin is slightly uptrending from 63 to 69.  He does have a long history of biopsy-positive sarcoidosis with pulmonary involvement and this could indicate some progression of his sarcoid to involve his heart.  Pain is relieved by sitting up and worsened by lying down which could be consistent with pericarditis.  Could be new heart failure although less likely without dyspnea, orthopnea, JVD.  PE study negative.  He was started on a heparin  drip in the ED and we will continue this until his troponins peak then transition to DVT prophylaxis.  We will continue to monitor troponins and check an echocardiogram, no prior echo for comparison. - Repeat troponin - Continue heparin  drip - TTE - Check lipase and BNP  Pulmonary sarcoidosis Chronic prednisone  use CTA for PE showed interval worsening of his opacifications in the lung bases with some concern for miliary TB or fungal infection.  With his chronic prednisone  use and since he has been lost to follow-up with pulmonology since 2018 we will discuss the case with our pulmonology team here.  Currently stable on room air without significant respiratory symptoms including cough.  He also has previously had a positive QuantiFERON gold in 2015 but underwent bronchoscopy with biopsy and AFB stains were negative. - Pulmonology consult -  Continue prednisone  3 mg daily  Headache He has a long history of weekly headaches but has never noticed that they are unilateral.  Sometimes they are associated with blurry vision and dizziness.  With his chest pain he has had tension type headaches that are unilateral on the left and associated with dizziness and blurry vision.  He notes the pain is a little bit worse than usual.  No other neurodeficits, no increased lacrimation, headaches are responsive to Tylenol .  With his new headache characteristics we will check a head CT. - Head CT with and without contrast - Tylenol  1000 mg every 8 hours as needed  Chronic pain Longstanding chronic pain primarily in his back that is managed by outpatient pain management.  He reports tramadol  has worked well for his pain and he is prescribed tramadol  50 mg every 6 hours as needed, PDMP reviewed and appropriate. - Tylenol  1000 mg  every 8 hours as needed, cyclobenzaprine  5 mg 3 times daily as needed, and tramadol  50 mg every 6 hours as needed  Diet: Normal VTE: Heparin  therapeutic Code: Full  Dispo: Admit patient to Observation with expected length of stay less than 2 midnights.  Signed: Fairy Pool, DO Internal Medicine Resident, PGY-3 Please contact the on call pager at [phone] for any urgent or emergent needs. 10:36 AM 08/13/2024 "

## 2024-08-13 NOTE — Consult Note (Incomplete)
 "  NAME:  [name], MRN:  [id number], DOB:  1945-09-10, LOS: 0 ADMISSION DATE:  08/12/2024, CONSULTATION DATE:  1/29 REFERRING MD:  Dr. Francesco, CHIEF COMPLAINT: Chest pain, atypical  History of Present Illness:  79 year old male with past medical history as below, which is significant for biopsy-proven sarcoidosis previously followed by Dr. Theophilus in the pulmonary clinic.  Diagnosis made by transbronchial biopsies in 2015.  He also has persistent endobronchial abnormality in the posterior bibasilar segment of the right lower lobe.  Biopsies were negative for malignancy.  He was on chronic prednisone  for some time but was eventually tapered off.  He has not been seen in our clinic since 2018.  At that time lung parenchyma had remained stable for a couple years.  Presented to [facility] ED 1/29 with complaints of chest pain. Troponin was mildly elevated. CTA was done and ruled out PE, but showered bibasilar ground glass with some possibly consolidation. PCCM was asked to evaluate for further recommendations. Seems like he has been restarted on chronic prednisone  since last being seen in clinic. Also has history of positive quantiferon gold, but workup thereafter was negative for TB including bronch/biopsy/AFB.   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   Significant Hospital Events: Including procedures, antibiotic start and stop dates in addition to other pertinent events     Interim History / Subjective:    Objective    Blood pressure 121/71,  pulse 72, temperature 98.1 F (36.7 C), temperature source Oral, resp. rate 16, height 5' 11 (1.803 m), weight 76.1 kg, SpO2 100%.       No intake or output data in the 24 hours ending 08/13/24 1048 Filed Weights   08/13/24 0625  Weight: 76.1 kg    Examination: General: *** HENT: *** Lungs: *** Cardiovascular: *** Abdomen: *** Extremities: *** Neuro: *** GU: ***  Resolved problem list   Assessment and Plan   Pulmonary sarcoidosis: CT read as worsening disease in both bases, by my read its not too different in a 3 year period. The patient is asymptomatic. He has had extensive TB workup in the past, which was negative.  - Continue chronic prednisone  - Room air - No acute pulmonary needs - Will arrange a follow up in the pulmonary clinic.   NSTEMI - management per [facility]/Cardiology  Labs   CBC: Recent Labs  Lab 08/12/24 1833  WBC 5.1  NEUTROABS 2.5  HGB 13.6  HCT 40.3  MCV 88.2  PLT 220    Basic Metabolic Panel: Recent Labs  Lab 08/12/24 1833  NA 141  K 3.9  CL 105  CO2 26  GLUCOSE 77  BUN 17  CREATININE 0.67  CALCIUM 9.2   GFR: Estimated Creatinine Clearance: 83.7 mL/min (by C-G formula based on SCr of 0.67 mg/dL). Recent Labs  Lab 08/12/24 1833  WBC 5.1    Liver Function Tests: Recent Labs  Lab 08/12/24 1833  AST 45*  ALT 20  ALKPHOS 54  BILITOT 0.8  PROT 8.8*  ALBUMIN 3.8   No results for input(s): LIPASE, AMYLASE in the last 168 hours. No results for input(s): AMMONIA in the last 168  hours.  ABG    Component Value Date/Time   HCO3 25.3 (H) 08/24/2007 0600   TCO2 28 09/11/2017 1821     Coagulation Profile: No results for input(s): INR, PROTIME in the last 168 hours.  Cardiac Enzymes: No results for input(s): CKTOTAL, CKMB, CKMBINDEX, TROPONINI in the last 168 hours.  HbA1C: Hgb A1c MFr Bld  Date/Time Value Ref Range Status  07/25/2022 12:24 PM 6.3 4.6 - 6.5 % Final    Comment:    Glycemic Control  Guidelines for People with Diabetes:Non Diabetic:  <6%Goal of Therapy: <7%Additional Action Suggested:  >8%     CBG: No results for input(s): GLUCAP in the last 168 hours.  Review of Systems:   ***  Past Medical History:  He,  has a past medical history of (QFT) QuantiFERON-TB test reaction without active tuberculosis, Autoimmune pancreatitis (HCC), Bladder diverticulum (12/17/2016), Bladder diverticulum, DOE (dyspnea on exertion), Frequent falls, Glaucoma, Hematuria, History of GI bleed, History of positive hepatitis C (dx 06-03-2014), History of squamous cell carcinoma excision, Pancreatic mass (12/12/2016), and Pulmonary sarcoidosis (pulmologist-  dr clance/ dr lonna december).   Surgical History:   Past Surgical History:  Procedure Laterality Date   CYSTOSCOPY WITH BIOPSY N/A 01/04/2017   Procedure: CYSTOSCOPY WITH BIOPSY AND FULGURATION;  Surgeon: Nieves Cough, MD;  [facility];  Service: Urology;  Laterality: N/A;   CYSTOSCOPY WITH BIOPSY N/A 02/13/2019   Procedure: CYSTOSCOPY WITH /BLADDER BIOPSY/ FULGURATION;  Surgeon: Nieves Cough, MD;  Location: [facility];  Service: Urology;  Laterality: N/A;   ESOPHAGOGASTRODUODENOSCOPY  last one 09-05-2010   HERNIA REPAIR  1982   RE-EXCISION LESION BACK/ NECK AREA  09/16/2007   VIDEO BRONCHOSCOPY Bilateral 09/15/2013   Procedure: VIDEO BRONCHOSCOPY WITH FLUORO;  Surgeon: Francis CHRISTELLA Dresser, MD;  Location: [facility];  Service: Cardiopulmonary;  Laterality: Bilateral;   VIDEO BRONCHOSCOPY Bilateral 12/16/2013   Procedure: VIDEO BRONCHOSCOPY WITHOUT FLUORO;  Surgeon: Francis CHRISTELLA Dresser, MD;  Location: [facility];  Service: Cardiopulmonary;  Laterality: Bilateral;     Social History:   reports that he has never smoked. He has never used smokeless tobacco. He reports that he does not drink alcohol and does not use drugs.   Family History:  His family history includes Cancer - Prostate in his brother; Heart failure in  his father; Hypertension in his mother; Stroke in his sister.   Allergies Allergies[1]   Home Medications  Prior to Admission medications  Medication Sig Start Date End Date Taking? Authorizing Provider  acetaminophen  (TYLENOL ) 650 MG CR tablet Take 1,300 mg by mouth in the morning, at noon, and at bedtime.   Yes [provider]  brimonidine  (ALPHAGAN ) 0.2 % ophthalmic solution Place 1 drop into the right eye 2 (two) times a day.   Yes [provider]  brinzolamide (AZOPT) 1 % ophthalmic suspension Place 1 drop into the right eye 2 (two) times a day.   Yes [provider]  cyclobenzaprine  (FLEXERIL ) 5 MG tablet Take 5 mg by mouth daily. 08/05/24 11/03/24 Yes [provider]  dorzolamide  (TRUSOPT ) 2 % ophthalmic solution Place 1 drop into the right eye 2 (two) times daily.   Yes [provider]  latanoprost  (XALATAN ) 0.005 % ophthalmic solution Place 1 drop into both eyes at bedtime. As directed 06/19/14  Yes [provider]  predniSONE  (DELTASONE ) 1 MG tablet Take 3 mg by mouth daily.   Yes [provider]  timolol  (TIMOPTIC ) 0.5 % ophthalmic solution Place 1 drop into  both eyes 2 (two) times daily.  05/19/17  Yes [provider]  brimonidine  (ALPHAGAN ) 0.15 % ophthalmic solution Place 1 drop into the right eye 2 (two) times daily. Patient not taking: Reported on 08/13/2024 07/23/24   [provider]  meclizine  (ANTIVERT ) 25 MG tablet Take 1 tablet (25 mg total) by mouth 3 (three) times daily as needed for dizziness. Patient not taking: Reported on 08/13/2024 09/26/22   Raford Lenis, MD  oxyCODONE -acetaminophen  (PERCOCET/ROXICET) 5-325 MG tablet Take 1 tablet by mouth every 6 (six) hours as needed for severe pain (pain score 7-10). Patient not taking: Reported on 08/13/2024 05/22/24   Prosperi, Christian H, PA-C  rosuvastatin (CRESTOR) 10 MG tablet Take 10 mg by mouth. Patient not taking: Reported on 08/13/2024 08/12/24    [provider]  traMADol  (ULTRAM ) 50 MG tablet Take 50 mg by mouth. Patient not taking: Reported on 08/13/2024 10/09/21   [provider]     Critical care time: ***              [1]  Allergies Allergen Reactions   Gabapentin Nausea Only and Other (See Comments)    Dizziness   Penicillins Other (See Comments)    Unknown reaction 30+years ago    "

## 2024-08-13 NOTE — Progress Notes (Addendum)
 PHARMACY - ANTICOAGULATION CONSULT NOTE  Pharmacy Consult for heparin  Indication: chest pain/ACS  Allergies[1]  Patient Measurements: Height: 5' 11 (180.3 cm) Weight: 76.1 kg (167 lb 12.3 oz) (Wt from 1/28) IBW/kg (Calculated) : 75.3 HEPARIN  DW (KG): 76.1  Vital Signs: Temp: 98.1 F (36.7 C) (01/29 0625) Temp Source: Oral (01/29 0625) BP: 121/71 (01/29 0625) Pulse Rate: 72 (01/29 0625)  Labs: Recent Labs    08/12/24 1833  HGB 13.6  HCT 40.3  PLT 220  CREATININE 0.67    Estimated Creatinine Clearance: 83.7 mL/min (by C-G formula based on SCr of 0.67 mg/dL).  Assessment: 52 yoM presented with chest pain. Pharmacy consulted to dose heparin  for ACS.  -CBC WNL -No prior oral anticoagulation -Trop 63 > 69  Goal of Therapy:  Heparin  level 0.3-0.7 units/ml Monitor platelets by anticoagulation protocol: Yes   Plan:  Give 4000 units bolus x 1 Start heparin  infusion at 900 units/hr Check anti-Xa level in 8 hours and daily while on heparin  Continue to monitor H&H and platelets  Rutha Poplar, PharmD, BCPS Clinical Pharmacist 08/13/2024 6:48 AM         [1]  Allergies Allergen Reactions   Gabapentin Nausea Only    Other Reaction(s): Dizziness   Penicillins Other (See Comments)    Unknown reaction 30+years ago  Has patient had a PCN reaction causing immediate rash, facial/tongue/throat swelling, SOB or lightheadedness with hypotension: Unknown Has patient had a PCN reaction causing severe rash involving mucus membranes or skin necrosis: Unknown Has patient had a PCN reaction that required hospitalization: Unknown Has patient had a PCN reaction occurring within the last 10 years: No If all of the above answers are NO, then may proceed with Cephalosporin use.

## 2024-08-13 NOTE — ED Provider Notes (Signed)
 " [place] [facility] Provider Note   CSN: [id number] Arrival date & time: 08/12/24  1722     Patient presents with: Chest Pain   [name] is a 79 y.o. male.   Patient presents to the emergency department for evaluation of chest pain.  Patient reports that he has been having chest pain at night frequently.  Tonight he had a sharp pain like a knife stabbing him in the left side of his chest with radiation to the neck and arm.  He reports that when the pain is present and at its most severe he does feel short of breath.       Prior to Admission medications  Medication Sig Start Date End Date Taking? Authorizing Provider  ACCU-CHEK AVIVA PLUS test strip  05/24/17   [provider]  ACCU-CHEK SOFTCLIX LANCETS lancets  05/24/17   [provider]  Blood Glucose Monitoring Suppl (ACCU-CHEK AVIVA PLUS) w/Device KIT  05/24/17   [provider]  brimonidine  (ALPHAGAN ) 0.2 % ophthalmic solution Place 1 drop into the right eye 2 (two) times a day.    [provider]  brinzolamide (AZOPT) 1 % ophthalmic suspension Place 1 drop into the right eye 2 (two) times a day.    [provider]  EPINEPHrine  0.3 mg/0.3 mL IJ SOAJ injection Inject 0.3 mg into the muscle as needed for anaphylaxis. Patient not taking: Reported on 04/18/2023 02/01/22   [name], PA-C  ferrous sulfate 325 (65 FE) MG tablet Take 325 mg by mouth daily with breakfast. Patient not taking: Reported on 04/18/2023 10/03/22   [provider]  latanoprost  (XALATAN ) 0.005 % ophthalmic solution Place 1 drop into both eyes at bedtime. As directed 06/19/14   [provider]  meclizine  (ANTIVERT ) 25 MG tablet Take 1 tablet (25 mg total) by mouth 3 (three) times daily as needed for dizziness. 09/26/22   Raford Lenis, MD  methocarbamol  (ROBAXIN ) 500 MG tablet Take 1 tablet (500 mg total) by mouth 2 (two) times daily. Patient not taking:  Reported on 04/18/2023 10/25/21   Shepard Clinch, PA-C  methylPREDNISolone  (MEDROL  DOSEPAK) 4 MG TBPK tablet Take 6tabs x1day, then 5tabs x1day, then 4tabs x1day, then 3tabs x1day, then 2tabs x1day, then 1tab x1day, then STOP 07/08/23   Leigh Venetia CROME, MD  naproxen  (NAPROSYN ) 500 MG tablet Take 1 tablet (500 mg total) by mouth 2 (two) times daily. Patient not taking: Reported on 04/18/2023 10/25/21   Venter, Margaux, PA-C  oxyCODONE -acetaminophen  (PERCOCET/ROXICET) 5-325 MG tablet Take 1 tablet by mouth every 6 (six) hours as needed for severe pain (pain score 7-10). 05/22/24   Prosperi, Christian H, PA-C  timolol  (TIMOPTIC ) 0.5 % ophthalmic solution Place 1 drop into both eyes 2 (two) times daily.  05/19/17   [provider]    Allergies: Gabapentin and Penicillins    Review of Systems  Updated Vital Signs BP 121/71   Pulse 72   Temp 98.1 F (36.7 C) (Oral)   Resp 16   SpO2 100%   Physical Exam Vitals and nursing note reviewed.  Constitutional:      General: He is not in acute distress.    Appearance: He is well-developed.  HENT:     Head: Normocephalic and atraumatic.     Mouth/Throat:     Mouth: Mucous membranes are moist.  Eyes:     General: Vision grossly intact. Gaze aligned appropriately.     Extraocular Movements: Extraocular movements intact.  Conjunctiva/sclera: Conjunctivae normal.  Cardiovascular:     Rate and Rhythm: Normal rate and regular rhythm.     Pulses: Normal pulses.     Heart sounds: Normal heart sounds, S1 normal and S2 normal. No murmur heard.    No friction rub. No gallop.  Pulmonary:     Effort: Pulmonary effort is normal. No respiratory distress.     Breath sounds: Normal breath sounds.  Abdominal:     Palpations: Abdomen is soft.     Tenderness: There is no abdominal tenderness. There is no guarding or rebound.     Hernia: No hernia is present.  Musculoskeletal:        General: No swelling.     Cervical back: Full passive range of  motion without pain, normal range of motion and neck supple. No pain with movement, spinous process tenderness or muscular tenderness. Normal range of motion.     Right lower leg: No edema.     Left lower leg: No edema.  Skin:    General: Skin is warm and dry.     Capillary Refill: Capillary refill takes less than 2 seconds.     Findings: No ecchymosis, erythema, lesion or wound.  Neurological:     Mental Status: He is alert and oriented to person, place, and time.     GCS: GCS eye subscore is 4. GCS verbal subscore is 5. GCS motor subscore is 6.     Cranial Nerves: Cranial nerves 2-12 are intact.     Sensory: Sensation is intact.     Motor: Motor function is intact. No weakness or abnormal muscle tone.     Coordination: Coordination is intact.  Psychiatric:        Mood and Affect: Mood normal.        Speech: Speech normal.        Behavior: Behavior normal.     (all labs ordered are listed, but only abnormal results are displayed) Labs Reviewed  COMPREHENSIVE METABOLIC PANEL WITH GFR - Abnormal; Notable for the following components:      Result Value   Total Protein 8.8 (*)    AST 45 (*)    All other components within normal limits  TROPONIN T, HIGH SENSITIVITY - Abnormal; Notable for the following components:   Troponin T High Sensitivity 63 (*)    All other components within normal limits  TROPONIN T, HIGH SENSITIVITY - Abnormal; Notable for the following components:   Troponin T High Sensitivity 69 (*)    All other components within normal limits  CBC WITH DIFFERENTIAL/PLATELET    EKG: EKG Interpretation Date/Time:  Wednesday August 12 2024 21:29:27 EST Ventricular Rate:  82 PR Interval:  156 QRS Duration:  92 QT Interval:  370 QTC Calculation: 432 R Axis:   3  Text Interpretation: Normal sinus rhythm Normal ECG When compared with ECG of 12-Aug-2024 17:28, No significant change was found Confirmed by Raford Lenis ([id number]) on 08/12/2024 11:41:12 PM  Radiology: [facility]  Chest 1 View Result Date: 08/12/2024 EXAM: 1 VIEW(S) XRAY OF THE CHEST 08/12/2024 07:14:28 PM COMPARISON: 07/14/2024 CLINICAL HISTORY: Shortness of breath. FINDINGS: LUNGS AND PLEURA: Stable chronic nodular opacities demonstrating an upper lobe predominance, compatible with patient's known history of sarcoidosis. No superimposed focal consolidation. No pleural effusion. No pneumothorax. HEART AND MEDIASTINUM: No acute abnormality of the cardiac and mediastinal silhouettes. BONES AND SOFT TISSUES: No acute osseous abnormality. IMPRESSION: 1. No superimposed focal consolidation. 2. Stable chronic nodular opacities with upper lobe predominance, compatible  with known sarcoidosis. Electronically signed by: Dorethia Molt MD 08/12/2024 07:22 PM EST RP Workstation: [id number]     Procedures   Medications Ordered in the ED  aspirin  chewable tablet 324 mg (324 mg Oral Given 08/13/24 0517)  morphine  (PF) 4 MG/ML injection 4 mg (4 mg Intravenous Given 08/13/24 0518)  ondansetron  (ZOFRAN ) injection 4 mg (4 mg Intravenous Given 08/13/24 0518)  iohexol  (OMNIPAQUE ) 350 MG/ML injection 75 mL (75 mLs Intravenous Contrast Given 08/13/24 0553)                                    Medical Decision Making Amount and/or Complexity of Data Reviewed Labs: ordered. Decision-making details documented in ED Course. Radiology: ordered and independent interpretation performed. Decision-making details documented in ED Course. ECG/medicine tests: ordered and independent interpretation performed. Decision-making details documented in ED Course.  Risk OTC drugs. Prescription drug management.   Differential Diagnosis considered includes, but not limited to: STEMI; NSTEMI; myocarditis; pericarditis; pulmonary embolism; aortic dissection; pneumothorax; pneumonia; gastritis; musculoskeletal pain  Patient presents to the emergency department for evaluation of chest pain.  Patient having intermittent pains, mostly at night for 1  week.  Patient reports a stabbing pain in the left chest that goes into his neck and arm when it is at its most severe.  He has had some mild shortness of breath.  EKG unremarkable at arrival.  First troponin elevated at 63.  Second troponin slightly further elevated at 69.  Reviewing his records, he did have cardiac workup 6 months ago that included serial troponin I's that were not elevated.  Initiate aspirin , heparin , will perform PE study to rule out PE as a cause of symptoms.  CRITICAL CARE Performed by: Lonni JINNY Seats   Total critical care time: 30 minutes  Critical care time was exclusive of separately billable procedures and treating other patients.  Critical care was necessary to treat or prevent imminent or life-threatening deterioration.  Critical care was time spent personally by me on the following activities: development of treatment plan with patient and/or surrogate as well as nursing, discussions with consultants, evaluation of patient's response to treatment, examination of patient, obtaining history from patient or surrogate, ordering and performing treatments and interventions, ordering and review of laboratory studies, ordering and review of radiographic studies, pulse oximetry and re-evaluation of patient's condition.      Final diagnoses:  Chest pain, unspecified type  Elevated troponin    ED Discharge Orders     None          Seats Lonni JINNY, MD 08/13/24 [phone]  "

## 2024-08-13 NOTE — Progress Notes (Addendum)
 08/13/2024 Seen briefly. 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 He does note worsening breathing when off the steroids. He is interested in re-establishing pulmonary care with Dr. Theophilus which we will arrange. Please reach out if any questions or concerns.  Rolan Sharps MD PCCM

## 2024-08-13 NOTE — ED Notes (Signed)
Eye drops requested from pharmacy. 

## 2024-08-13 NOTE — Telephone Encounter (Signed)
 Patient scheduled with Mannam in 1 month

## 2024-08-13 NOTE — Significant Event (Signed)
 Rapid Response Event Note   Reason for Call :  CP 10/10  Initial Focused Assessment:  Pt lying in bed with eyes open. He is anxious and tachypneic. He says his pain started in his back and radiated to his chest and up his neck to his head. He says he feels it just in his head now and it is getting better. He says he has this pain frequently at home and that he takes tramadol  to help it. Lungs are diminished t/o. Skin diaphoretic.   HR-77, BP-141/86, RR-24, SpO2-99% on RA   Interventions:  EKG-NSR Tramadol  50mg  PO(already a prn order) Plan of Care:  Pain getting better on assessment. Allow time for tramadol  to work. Continue to monitor pt closely. Please call RRT if further assistance needed.   Event Summary:   MD Notified: Drs. Marylu and Amilibia notified by bedside RN and came to bedside.  Call Time:2205 Arrival Time:2222 End Upfz:[id number]  Tish Graeme Piety, RN

## 2024-08-13 NOTE — Plan of Care (Signed)
   Problem: Education: Goal: Knowledge of General Education information will improve Description Including pain rating scale, medication(s)/side effects and non-pharmacologic comfort measures Outcome: Progressing

## 2024-08-14 ENCOUNTER — Observation Stay (HOSPITAL_COMMUNITY)

## 2024-08-14 DIAGNOSIS — D869 Sarcoidosis, unspecified: Secondary | ICD-10-CM

## 2024-08-14 DIAGNOSIS — R0789 Other chest pain: Secondary | ICD-10-CM | POA: Diagnosis not present

## 2024-08-14 DIAGNOSIS — R7989 Other specified abnormal findings of blood chemistry: Secondary | ICD-10-CM | POA: Diagnosis not present

## 2024-08-14 DIAGNOSIS — R072 Precordial pain: Secondary | ICD-10-CM

## 2024-08-14 DIAGNOSIS — M549 Dorsalgia, unspecified: Secondary | ICD-10-CM

## 2024-08-14 DIAGNOSIS — K5900 Constipation, unspecified: Secondary | ICD-10-CM

## 2024-08-14 DIAGNOSIS — R079 Chest pain, unspecified: Secondary | ICD-10-CM | POA: Diagnosis not present

## 2024-08-14 DIAGNOSIS — G8929 Other chronic pain: Secondary | ICD-10-CM | POA: Diagnosis not present

## 2024-08-14 LAB — CBC
HCT: 38.3 % — ABNORMAL LOW (ref 39.0–52.0)
Hemoglobin: 12.9 g/dL — ABNORMAL LOW (ref 13.0–17.0)
MCH: 29.3 pg (ref 26.0–34.0)
MCHC: 33.7 g/dL (ref 30.0–36.0)
MCV: 87 fL (ref 80.0–100.0)
Platelets: 186 10*3/uL (ref 150–400)
RBC: 4.4 MIL/uL (ref 4.22–5.81)
RDW: 13.4 % (ref 11.5–15.5)
WBC: 4.6 10*3/uL (ref 4.0–10.5)
nRBC: 0 % (ref 0.0–0.2)

## 2024-08-14 LAB — COMPREHENSIVE METABOLIC PANEL WITH GFR
ALT: 17 U/L (ref 0–44)
AST: 38 U/L (ref 15–41)
Albumin: 3.4 g/dL — ABNORMAL LOW (ref 3.5–5.0)
Alkaline Phosphatase: 50 U/L (ref 38–126)
Anion gap: 9 (ref 5–15)
BUN: 17 mg/dL (ref 8–23)
CO2: 24 mmol/L (ref 22–32)
Calcium: 8.8 mg/dL — ABNORMAL LOW (ref 8.9–10.3)
Chloride: 103 mmol/L (ref 98–111)
Creatinine, Ser: 0.52 mg/dL — ABNORMAL LOW (ref 0.61–1.24)
GFR, Estimated: >60 mL/min
Glucose, Bld: 89 mg/dL (ref 70–99)
Potassium: 3.5 mmol/L (ref 3.5–5.1)
Sodium: 136 mmol/L (ref 135–145)
Total Bilirubin: 0.9 mg/dL (ref 0.0–1.2)
Total Protein: 8 g/dL (ref 6.5–8.1)

## 2024-08-14 MED ORDER — REGADENOSON 0.4 MG/5ML IV SOLN
0.4000 mg | Freq: Once | INTRAVENOUS | Status: AC
  Administered 2024-08-14: 0.4 mg via INTRAVENOUS

## 2024-08-14 MED ORDER — POLYETHYLENE GLYCOL 3350 17 G PO PACK
17.0000 g | PACK | Freq: Every day | ORAL | Status: DC
  Administered 2024-08-14: 17 g via ORAL
  Filled 2024-08-14: qty 1

## 2024-08-14 MED ORDER — SENNOSIDES-DOCUSATE SODIUM 8.6-50 MG PO TABS
1.0000 | ORAL_TABLET | Freq: Every day | ORAL | Status: DC
  Administered 2024-08-14: 1 via ORAL
  Filled 2024-08-14: qty 1

## 2024-08-14 MED ORDER — REGADENOSON 0.4 MG/5ML IV SOLN
INTRAVENOUS | Status: AC
  Filled 2024-08-14: qty 5

## 2024-08-14 MED ORDER — GADOBUTROL 1 MMOL/ML IV SOLN
10.0000 mL | Freq: Once | INTRAVENOUS | Status: AC | PRN
  Administered 2024-08-14: 10 mL via INTRAVENOUS

## 2024-08-14 NOTE — Progress Notes (Addendum)
 " Progress Note  Patient Name: [name] Date of Encounter: 08/14/2024  Primary Cardiologist: Georganna Archer, MD  Subjective   Had CP episode last night - head became hot, breath was hot per sister at bedside. Tramadol  helped significantly and remains pain free today. Sister concerned that the fall he had a few weeks ago may have caused nerve damage.  Inpatient Medications    Scheduled Meds:  brimonidine   1 drop Right Eye BID   dorzolamide   1 drop Right Eye BID   enoxaparin  (LOVENOX ) injection  40 mg Subcutaneous Q24H   latanoprost   1 drop Both Eyes QHS   polyethylene glycol  17 g Oral Daily   predniSONE   3 mg Oral Daily   senna-docusate  1 tablet Oral Daily   timolol   1 drop Both Eyes BID   Continuous Infusions:  PRN Meds: acetaminophen , cyclobenzaprine , traMADol    Vital Signs    Vitals:   08/14/24 1045 08/14/24 1102 08/14/24 1104 08/14/24 1106  BP: 121/79 112/75 123/76 125/84  Pulse: 71 (!) 105 96 91  Resp:    17  Temp:    98.4 F (36.9 C)  TempSrc:    Oral  SpO2:      Weight:      Height:        Intake/Output Summary (Last 24 hours) at 08/14/2024 1541 Last data filed at 08/14/2024 0800 Gross per 24 hour  Intake 614.29 ml  Output --  Net 614.29 ml      08/14/2024    4:39 AM 08/13/2024    4:52 PM 08/13/2024    6:25 AM  Last 3 Weights  Weight (lbs) 165 lb 12.6 oz 166 lb 0.1 oz 167 lb 12.3 oz  Weight (kg) 75.2 kg 75.3 kg 76.1 kg     Telemetry    NSR, occ PACs - Personally Reviewed  Physical Exam   General: Temporal wasting/thin appearing otherwise in no acute distress. Head: Normocephalic, atraumatic, sclera non-icteric, no xanthomas, nares are without discharge. Neck: Negative for carotid bruits. JVP not elevated. Lungs: Clear bilaterally to auscultation without wheezes, rales, or rhonchi. Breathing is unlabored. Heart: RRR S1 S2 without murmurs, rubs, or gallops.  Abdomen: Soft, non-tender, non-distended with normoactive bowel sounds. No  rebound/guarding. Extremities: No clubbing or cyanosis. No edema. Distal pedal pulses are 2+ and equal bilaterally. Neuro: Alert and oriented X 3. Moves all extremities spontaneously. Psych:  Responds to questions appropriately with a normal affect.   Labs    High Sensitivity Troponin:  No results for input(s): TROPONINIHS in the last 720 hours.    Cardiac EnzymesNo results for input(s): TROPONINI in the last 168 hours. No results for input(s): TROPIPOC in the last 168 hours.   Chemistry Recent Labs  Lab 08/12/24 1833 08/14/24 0233  NA 141 136  K 3.9 3.5  CL 105 103  CO2 26 24  GLUCOSE 77 89  BUN 17 17  CREATININE 0.67 0.52*  CALCIUM 9.2 8.8*  PROT 8.8* 8.0  ALBUMIN 3.8 3.4*  AST 45* 38  ALT 20 17  ALKPHOS 54 50  BILITOT 0.8 0.9  GFRNONAA >60 >60  ANIONGAP 11 9     Hematology Recent Labs  Lab 08/12/24 1833 08/14/24 0233  WBC 5.1 4.6  RBC 4.57 4.40  HGB 13.6 12.9*  HCT 40.3 38.3*  MCV 88.2 87.0  MCH 29.8 29.3  MCHC 33.7 33.7  RDW 13.5 13.4  PLT 220 186    BNP Recent Labs  Lab 08/13/24 1044  PROBNP <50.0     DDimer No results for input(s): DDIMER in the last 168 hours.   Radiology    ECHOCARDIOGRAM COMPLETE Result Date: 08/13/2024    ECHOCARDIOGRAM REPORT   Patient Name:   KIMBALL APPLEBY Date of Exam: 08/13/2024 Medical Rec #:  [id number]     Height:       71.0 in Accession #:    [id number]    Weight:       167.8 lb Date of Birth:  02/21/1946    BSA:          1.957 m Patient Age:    78 years      BP:           127/91 mmHg Patient Gender: M             HR:           61 bpm. Exam Location:  Inpatient Procedure: 2D Echo, Cardiac Doppler, Color Doppler and Intracardiac            Opacification Agent (Both Spectral and Color Flow Doppler were            utilized during procedure). Indications:    Chest Pain R07.9  History:        Patient has no prior history of Echocardiogram examinations.                 Sarcoidosis.  Sonographer:    Merlynn Argyle Referring  Phys: [id number] ELSIE KATHEE SAVANNAH IMPRESSIONS  1. Left ventricular ejection fraction, by estimation, is 45 to 50%. The left ventricle has mildly decreased function. The left ventricle demonstrates global hypokinesis. Left ventricular diastolic parameters were normal.  2. Right ventricular systolic function is mildly reduced. The right ventricular size is normal. There is normal pulmonary artery systolic pressure. The estimated right ventricular systolic pressure is 29.0 mmHg.  3. The mitral valve is normal in structure. No evidence of mitral valve regurgitation. No evidence of mitral stenosis.  4. The aortic valve is tricuspid. Aortic valve regurgitation is trivial. Aortic valve sclerosis/calcification is present, without any evidence of aortic stenosis. Aortic regurgitation PHT measures 737 msec.  5. Aortic dilatation noted. There is mild dilatation of the ascending aorta, measuring 39 mm.  6. The inferior vena cava is dilated in size with >50% respiratory variability, suggesting right atrial pressure of 8 mmHg. FINDINGS  Left Ventricle: Left ventricular ejection fraction, by estimation, is 45 to 50%. The left ventricle has mildly decreased function. The left ventricle demonstrates global hypokinesis. Definity  contrast agent was given IV to delineate the left ventricular  endocardial borders. The left ventricular internal cavity size was normal in size. There is no left ventricular hypertrophy. Abnormal (paradoxical) septal motion, consistent with left bundle branch block. Left ventricular diastolic parameters were normal. Normal left ventricular filling pressure. Right Ventricle: The right ventricular size is normal. No increase in right ventricular wall thickness. Right ventricular systolic function is mildly reduced. There is normal pulmonary artery systolic pressure. The tricuspid regurgitant velocity is 2.29 m/s, and with an assumed right atrial pressure of 8 mmHg, the estimated right ventricular systolic  pressure is 29.0 mmHg. Left Atrium: Left atrial size was normal in size. Right Atrium: Right atrial size was normal in size. Pericardium: There is no evidence of pericardial effusion. Mitral Valve: The mitral valve is normal in structure. No evidence of mitral valve regurgitation. No evidence of mitral valve stenosis. Tricuspid Valve: The tricuspid valve is normal in structure. Tricuspid valve regurgitation is  mild . No evidence of tricuspid stenosis. Aortic Valve: The aortic valve is tricuspid. Aortic valve regurgitation is trivial. Aortic regurgitation PHT measures 737 msec. Aortic valve sclerosis/calcification is present, without any evidence of aortic stenosis. Pulmonic Valve: The pulmonic valve was normal in structure. Pulmonic valve regurgitation is not visualized. No evidence of pulmonic stenosis. Aorta: Aortic dilatation noted. There is mild dilatation of the ascending aorta, measuring 39 mm. Venous: The inferior vena cava is dilated in size with greater than 50% respiratory variability, suggesting right atrial pressure of 8 mmHg. IAS/Shunts: No atrial level shunt detected by color flow Doppler.  LEFT VENTRICLE PLAX 2D LVIDd:         3.70 cm      Diastology LVIDs:         3.00 cm      LV e' medial:    8.70 cm/s LV PW:         1.20 cm      LV E/e' medial:  5.0 LV IVS:        1.10 cm      LV e' lateral:   11.00 cm/s LVOT diam:     2.20 cm      LV E/e' lateral: 3.9 LV SV:         73 LV SV Index:   37 LVOT Area:     3.80 cm  LV Volumes (MOD) LV vol d, MOD A2C: 89.0 ml LV vol d, MOD A4C: 111.0 ml LV vol s, MOD A2C: 52.3 ml LV vol s, MOD A4C: 63.0 ml LV SV MOD A2C:     36.7 ml LV SV MOD A4C:     111.0 ml LV SV MOD BP:      44.7 ml RIGHT VENTRICLE            IVC RV Basal diam:  3.50 cm    IVC diam: 2.30 cm RV S prime:     9.32 cm/s TAPSE (M-mode): 1.4 cm LEFT ATRIUM             Index        RIGHT ATRIUM           Index LA diam:        2.50 cm 1.28 cm/m   RA Area:     15.20 cm LA Vol (A2C):   35.9 ml 18.35 ml/m   RA Volume:   35.30 ml  18.04 ml/m LA Vol (A4C):   19.7 ml 10.07 ml/m LA Biplane Vol: 28.5 ml 14.56 ml/m  AORTIC VALVE LVOT Vmax:   101.00 cm/s LVOT Vmean:  63.400 cm/s LVOT VTI:    0.193 m AI PHT:      737 msec  AORTA Ao Root diam: 3.70 cm Ao Asc diam:  3.90 cm MITRAL VALVE               TRICUSPID VALVE MV Area (PHT): 2.07 cm    TR Peak grad:   21.0 mmHg MV Decel Time: 367 msec    TR Vmax:        229.00 cm/s MV E velocity: 43.30 cm/s MV A velocity: 81.80 cm/s  SHUNTS MV E/A ratio:  0.53        Systemic VTI:  0.19 m                            Systemic Diam: 2.20 cm Wilbert Bihari MD Electronically signed by Wilbert Bihari MD Signature Date/Time: 08/13/2024/7:38:33  PM    Final    CT HEAD W & WO CONTRAST ( ) Result Date: 08/13/2024 EXAM: CT HEAD WITHOUT AND WITH CONTRAST 08/13/2024 04:22:57 PM TECHNIQUE: CT of the head was performed without and with the administration of intravenous contrast. 75 mL iohexol  (OMNIPAQUE ) 350 MG/ML injection was administered. Automated exposure control, iterative reconstruction, and/or weight based adjustment of the mA/kV was utilized to reduce the radiation dose to as low as reasonably achievable. COMPARISON: Head CT 07/14/2024 and MRI 09/26/2022. CLINICAL HISTORY: Headache, increasing frequency or severity. FINDINGS: BRAIN AND VENTRICLES: There is no evidence of an acute infarct, intracranial hemorrhage, mass, midline shift, hydrocephalus, or extra-axial fluid collection. Cerebral volume is normal. No abnormal enhancement is identified. The major dural venous sinuses appear patent. ORBITS: Bilateral cataract extraction. SINUSES AND MASTOIDS: No acute abnormality. SOFT TISSUES AND SKULL: No focal bone lesion. No acute soft tissue abnormality. IMPRESSION: 1. Negative contrast enhanced head CT. Electronically signed by: Dasie Hamburg MD 08/13/2024 05:16 PM EST RP Workstation: [id number]   CT Angio Chest Pulmonary Embolism (PE) W or WO Contrast Result Date: 08/13/2024 EXAM: CTA CHEST  08/13/2024 05:52:47 AM TECHNIQUE: CTA of the chest was performed without and with the administration of 75 mL of iohexol  (OMNIPAQUE ) 350 MG/ML injection. Multiplanar reformatted images are provided for review. MIP images are provided for review. Automated exposure control, iterative reconstruction, and/or weight based adjustment of the mA/kV was utilized to reduce the radiation dose to as low as reasonably achievable. COMPARISON: CT of the chest dated 10/25/2021. CLINICAL HISTORY: Pulmonary embolism (PE) suspected, high probability. FINDINGS: PULMONARY ARTERIES: Pulmonary arteries are adequately opacified for evaluation. There is no evidence of pulmonary embolus. The main pulmonary arteries are dilated, measuring approximately 3.0 cm in diameter bilaterally. MEDIASTINUM: The heart is mildly enlarged and there is mild calcific coronary artery disease. There is no acute abnormality of the thoracic aorta. LYMPH NODES: There are calcified mediastinal and bilateral hilar lymph nodes. No axillary lymphadenopathy. LUNGS AND PLEURA: There are innumerable miliary nodular opacities present within the lungs bilaterally, significantly worse on the right. There are patchy and ground-glass opacities present within the lungs bilaterally, primarily dependently and also worse on the right. No focal consolidation or pulmonary edema. No evidence of pleural effusion or pneumothorax. The most likely differential diagnoses for the miliary nodular opacities include disseminated infection (e.g., tuberculosis, fungal infection), sarcoidosis, or metastatic disease. Follow-up imaging, such as a dedicated high-resolution CT (HRCT) of the chest, may be considered for further characterization. UPPER ABDOMEN: Limited images of the upper abdomen are unremarkable. SOFT TISSUES AND BONES: No acute bone or soft tissue abnormality. IMPRESSION: 1. No evidence of pulmonary embolus. 2. Interval worsening of hazy, patchy opacification slash consolidation  of the lung bases, worse on the right and chronic underlying miliary nodular opacities and patchy ground-glass opacities bilaterally, worse on the right. Differential considerations include miliary infection (tuberculosis or fungal) and less likely inflammatory pneumoconiosis/hypersensitivity pneumonitis. 3. Calcified mediastinal and bilateral hilar lymph nodes, most consistent with prior granulomatous disease. 4. Dilated main pulmonary arteries bilaterally, which can be seen with pulmonary arterial hypertension. 5. Mildly enlarged heart and mild calcific coronary artery disease. Electronically signed by: Evalene Coho MD 08/13/2024 06:38 AM EST RP Workstation: [id number]   DG Chest 1 View Result Date: 08/12/2024 EXAM: 1 VIEW(S) XRAY OF THE CHEST 08/12/2024 07:14:28 PM COMPARISON: 07/14/2024 CLINICAL HISTORY: Shortness of breath. FINDINGS: LUNGS AND PLEURA: Stable chronic nodular opacities demonstrating an upper lobe predominance, compatible with patient's known history of sarcoidosis. No superimposed focal  consolidation. No pleural effusion. No pneumothorax. HEART AND MEDIASTINUM: No acute abnormality of the cardiac and mediastinal silhouettes. BONES AND SOFT TISSUES: No acute osseous abnormality. IMPRESSION: 1. No superimposed focal consolidation. 2. Stable chronic nodular opacities with upper lobe predominance, compatible with known sarcoidosis. Electronically signed by: Dorethia Molt MD 08/12/2024 07:22 PM EST RP Workstation: [id number]    Cardiac Studies   2d echo 08/13/24   1. Left ventricular ejection fraction, by estimation, is 45 to 50%. The  left ventricle has mildly decreased function. The left ventricle  demonstrates global hypokinesis. Left ventricular diastolic parameters  were normal.   2. Right ventricular systolic function is mildly reduced. The right  ventricular size is normal. There is normal pulmonary artery systolic  pressure. The estimated right ventricular systolic pressure  is 29.0 mmHg.   3. The mitral valve is normal in structure. No evidence of mitral valve  regurgitation. No evidence of mitral stenosis.   4. The aortic valve is tricuspid. Aortic valve regurgitation is trivial.  Aortic valve sclerosis/calcification is present, without any evidence of  aortic stenosis. Aortic regurgitation PHT measures 737 msec.   5. Aortic dilatation noted. There is mild dilatation of the ascending  aorta, measuring 39 mm.   6. The inferior vena cava is dilated in size with >50% respiratory  variability, suggesting right atrial pressure of 8 mmHg.   Patient Profile     79 y.o. male with sarcoidosis, lumbar radiculopathy and chronic low back pain, glaucoma, chronic hepatitis C, autoimmune pancreatitis on long-term steroid therapy. Admitted with 2 week history of chest/arm pain and mildly elevated troponins.  Assessment & Plan    1. Chest pain, elevated troponin, LV dysfunction of unclear cause - atypical features - ddx includes but not limited to noncardiac chest pain, infiltrative disease, myopericarditis, atypical angina with angina decubitus. ESR elevated, CRP wnl.  - 2D echo with EF 45-50%, global HK, mildly reduced RV function, mild dilation of ascending aorta - proBNP wnl arguing against volume overload, appears euvolemic on exam - underwent cardiac stress MRI today, results pending, looking for ischemia or infiltrative disease - confirmed that Dr. Kate will read.  - check lipids in AM if still inpatient - most recent panel 05/2024 by PCP showed total cholesterol 164, HDL 34, LDL 115 per [facility], consider statin if cMRI concerning for CAD - GDMT and aspirin  recs TBD based on cMRI, next steps - note patient is scheduled for bilateral L4/L5,L5/S1 radiofrequency facet rhizotomy in 09/2024   2. Elevated BP without diagnosis of HTN - SBP 120s-140s, follow, anticipate management in context above  3. Mild dilation of ascending aorta - consider f/u echo 1  yr  4. Pulmonary sarcoid - abnormal CTA chest as noted, pulm has seen and plans outpatient f/u  For questions or updates, please contact [facility] Please consult [email or web address] for contact info under Cardiology/STEMI.  Signed, Dayna N Dunn, PA-C 08/14/2024, 12:52 PM    ADDENDUM:   Patient seen and examined with DAYNA N DUNN, PA-C.  I personally taken a history, examined the patient, reviewed relevant notes,  laboratory data / imaging studies.  I performed a substantive portion of this encounter and formulated the important aspects of the plan.  I agree with the APP's note, impression, and recommendations; however, I have edited the note to reflect changes or salient points.   Patient seen and examined at bedside. Status post MRI Accompanied by his wife and sister and family No active chest pain or heart failure symptoms  PHYSICAL EXAM: Today's Vitals   08/14/24 1045 08/14/24 1102 08/14/24 1104 08/14/24 1106  BP: 121/79 112/75 123/76 125/84  Pulse: 71 (!) 105 96 91  Resp:    17  Temp:    98.4 F (36.9 C)  TempSrc:    Oral  SpO2:      Weight:      Height:      PainSc:       Body mass index is 23.12 kg/m.   Net IO Since Admission: 614.29 mL [08/14/24 1541]  Filed Weights   08/13/24 0625 08/13/24 1652 08/14/24 0439  Weight: 76.1 kg 75.3 kg 75.2 kg    General: Age-appropriate, hemodynamically stable, no acute distress HEENT: Normocephalic, atraumatic, no JVP Lungs: Clear to auscultation bilaterally, no wheezes rales or rhonchi's Heart: Regular, positive S1-S2, no murmurs rubs or gallops Abdomen: Soft, nontender, nondistended, positive bowel sounds in all 4 quadrants Extremities: No swelling, warm to touch  EKG: (personally reviewed by me) No new tracings  Telemetry: (personally reviewed by me) Sinus without significant   Impression & Recommendations: :  Precordial pain Elevated troponins not due to ACS Presents with left-sided chest and arm pain  which is felt not to be cardiac discomfort Echocardiogram notes low normal to mildly reduced LVEF High sensitive troponins peaked at 80 Prior cardiology rounder felt that stress cardiac MRI can help rule out ischemia as well as look for any potential infiltrative reasons for mildly reduced LVEF.  Results forthcoming further recommendations to follow  Plan of care discussed with husband, wife and sister at bedside  Patient follows with [facility], patient is advised to reestablish follow-up status post discharge  Further recommendations to follow as the case evolves.   This note was created using a voice recognition software as a result there may be grammatical errors inadvertently enclosed that do not reflect the nature of this encounter. Every attempt is made to correct such errors.   Madonna Michele HAS, [facility]  A Division of [facility] [address] [place], [place] [address]  08/14/2024 3:41 PM    "

## 2024-08-14 NOTE — TOC Initial Note (Addendum)
 Transition of Care [facility]) - Initial/Assessment Note    Patient Details  Name: [name] MRN: [id number] Date of Birth: 1946/03/26  [facility]) CM/SW Contact:    Waddell Barnie Rama, RN Phone Number: 08/14/2024, 3:44 PM  Clinical Narrative:                 From home with spouse, has PCP and insurance on file,  has no HH services in place at this time , has a cane at home.  States family member will transport them home at [employer] and family is support system, states gets medications from [facility] .   Pta self ambulatory with cane.   There are no ICM needs identified  at this time.  Please place consult for ICM needs. Await pt eval.         Patient Goals and CMS Choice            Expected Discharge Plan and Services                                              Prior Living Arrangements/Services                       Activities of Daily Living   ADL Screening (condition at time of admission) Independently performs ADLs?: Yes (appropriate for developmental age) Is the patient deaf or have difficulty hearing?: No Does the patient have difficulty seeing, even when wearing glasses/contacts?: No Does the patient have difficulty concentrating, remembering, or making decisions?: No  Permission Sought/Granted                  Emotional Assessment              Admission diagnosis:  Elevated troponin [R79.89] Chest pain [R07.9] Chest pain, unspecified type [R07.9] Patient Active Problem List   Diagnosis Date Noted   Multilevel spine pain 08/14/2024   Chest pain 08/13/2024   Headache 08/13/2024   Elevated troponin 08/13/2024   Acute bronchitis 06/26/2017   Chronic hepatitis C without hepatic coma (HCC) 06/03/2014   Endobronchial mass 10/12/2013   Cough with hemoptysis 09/07/2013   Sarcoidosis 09/04/2013   History of positive PPD, untreated 09/04/2013   Dyspnea on exertion 09/04/2013   PCP:  [name], MD Pharmacy:    [id number] [address], [place] - [address] AT [facility] [address] [place] [address] Phone: [phone] Fax: [phone]     Social Drivers of Health (SDOH) Social History: SDOH Screenings   Food Insecurity: No Food Insecurity (08/13/2024)  Housing: Low Risk (08/13/2024)  Transportation Needs: No Transportation Needs (08/13/2024)  Utilities: Not At Risk (08/13/2024)  Financial Resource Strain: Low Risk (11/26/2023)   Received from [facility]  Physical Activity: Sufficiently Active (11/26/2023)   Received from [facility]  Social Connections: Socially Integrated (08/13/2024)  Stress: No Stress Concern Present (11/26/2023)   Received from [facility]  Tobacco Use: Low Risk (08/13/2024)   SDOH Interventions:     Readmission Risk Interventions     No data to display

## 2024-08-14 NOTE — Progress Notes (Signed)
 Patient presents for stress MRI.  BP 121/79, HR 58.  No caffeine intake in prior 12 hours.   EKG shows NSR, rate 67   Shared Decision Making/Informed Consent The risks [chest pain, shortness of breath, cardiac arrhythmias, dizziness, blood pressure fluctuations, myocardial infarction, stroke/transient ischemic attack, nausea, vomiting, allergic reaction, and life-threatening complications (estimated to be 1 in 10,000)], benefits (risk stratification, diagnosing coronary artery disease, treatment guidance) and alternatives of a MRI stress test were discussed in detail with patient and they agree to proceed.   Lonni LITTIE Nanas, MD

## 2024-08-14 NOTE — Plan of Care (Signed)
   Problem: Education: Goal: Knowledge of General Education information will improve Description Including pain rating scale, medication(s)/side effects and non-pharmacologic comfort measures Outcome: Progressing

## 2024-08-14 NOTE — Progress Notes (Signed)
 Relayed to medicine team that we anticipate that stress MRI results will not be available until later this evening; rounding team will review in AM for further recommendations.

## 2024-08-14 NOTE — Evaluation (Addendum)
 Physical Therapy Evaluation Patient Details Name: [name] MRN: [id number] DOB: 08-30-1945 Today's Date: 08/14/2024  History of Present Illness  [name] is a 79 yo male who presented 08/12/24 with CP. Ongoing work-up. PMHx:  pulmonary sarcoidosis, autoimmune pancreatitis, chronic lumbar back pain, chronic headaches, glaucoma, and hepatitis C  Clinical Impression  Pt presents with condition above and deficits mentioned below, see PT Problem List. PTA, he was mod I using a SPC vs RW for functional mobility. He does have a hx of falling outside while walking his small dog when not using an AD. He expressed concern over difficulty getting back up after some past falls. He lives with his wife in a 1-level house with 4 STE. His functional mobility is limited by chronic back and L knee pain. Currently, he is functioning near his baseline, only needing CGA for safety to perform all functional mobility with a RW. He displays deficits in generalized strength, balance, and activity tolerance. He reports feeling swimmy headed earlier when walking with OT and with positional changes, but his orthostatics were negative, see below. He also reports a hx of intermittent vertigo. Performed a quick vestibular screen with the head impulsive test, bil horizontal canal tests, and bil Dix Hallpike tests being negative. Did note poor smooth pursuits though. Pt reports the bad taste and hot sensation in his mouth along with the spreading pain through his L chest, arm, and head occur normally ~10 min after he lays down or in the middle of the night and it wakes him up. He reports he sleeps flat on his R side. Suggested to pt to sleep with his trunk and head semi propped up by pillows to see if that helps manage his symptoms pending MD findings. Pt also expressed several falls in which he was worried he would be unable to get up on his own, especially when outdoors. Demonstrated and educated pt on different ways to  recover after a fall and educated pt to keep a charged phone on him or get a life alert button if needed in case he were to fall. Also educated pt to get a referral to a vestibular PT if he continues to have intermittent vertigo. He verbalized understanding of all education. Pt reports he already has plans to go to an OPPT for his back pain and for his balance, which could be beneficial for him. Will continue to follow acutely.   SpO2 >/= 92% on RA throughout; HR up to low 100s  Orthostatics -  118/86 (97) supine 115/84 (95) & 95 bpm sitting 124/87 (99) & 106 bpm standing 127/81 (93) & 102 bpm standing ~3 min      If plan is discharge home, recommend the following: Assistance with cooking/housework;Assist for transportation;Help with stairs or ramp for entrance   Can travel by private vehicle        Equipment Recommendations None recommended by PT  Recommendations for Other Services       Functional Status Assessment Patient has had a recent decline in their functional status and demonstrates the ability to make significant improvements in function in a reasonable and predictable amount of time.     Precautions / Restrictions Precautions Precautions: Fall Restrictions Weight Bearing Restrictions Per Provider Order: No      Mobility  Bed Mobility Overal bed mobility: Needs Assistance Bed Mobility: Supine to Sit, Sit to Supine     Supine to sit: Supervision, HOB elevated, Used rails Sit to supine: Supervision, HOB elevated, Used  rails   General bed mobility comments: Supervision for safety, pt using rails, extra time due to back pain.    Transfers Overall transfer level: Needs assistance Equipment used: Rolling walker (2 wheels) Transfers: Sit to/from Stand Sit to Stand: Contact guard assist           General transfer comment: increased time to power up to stand, extend knees then hips and shift weight anteriorly as pt initially keeps weight in his heels until  his knees fully extend. CGA for safety    Ambulation/Gait Ambulation/Gait assistance: Contact guard assist Gait Distance (Feet): 160 Feet Assistive device: Rolling walker (2 wheels) Gait Pattern/deviations: Step-through pattern, Decreased stride length, Trunk flexed Gait velocity: reduced Gait velocity interpretation: 1.31 - 2.62 ft/sec, indicative of limited community ambulator   General Gait Details: Pt with mildly flexed posture, but fairly steady step-through gait pattern when using a RW. No LOB, CGA for safety  Stairs            Wheelchair Mobility     Tilt Bed    Modified Rankin (Stroke Patients Only)       Balance Overall balance assessment: Needs assistance, History of Falls Sitting-balance support: Feet supported, No upper extremity supported Sitting balance-Leahy Scale: Good Sitting balance - Comments: No LOB sitting EOB, supervision for safety   Standing balance support: Single extremity supported, Bilateral upper extremity supported, No upper extremity supported, During functional activity Standing balance-Leahy Scale: Fair Standing balance comment: Able to stand statically without UE support, but uses RW to ambulate                             Pertinent Vitals/Pain Pain Assessment Pain Assessment: Faces Faces Pain Scale: Hurts little more Pain Location: back and L knee (chronic) Pain Descriptors / Indicators: Discomfort, Sore, Grimacing, Guarding Pain Intervention(s): Limited activity within patient's tolerance, Monitored during session, Repositioned    Home Living Family/patient expects to be discharged to:: Private residence Living Arrangements: Spouse/significant other Available Help at Discharge: Family;Available 24 hours/day Type of Home: House Home Access: Stairs to enter Entrance Stairs-Rails: Doctor, General Practice of Steps: 4   Home Layout: One level Home Equipment: Agricultural Consultant (2 wheels);Cane - single point       Prior Function Prior Level of Function : Independent/Modified Independent;Driving             Mobility Comments: SPC vs RW for mod I mobility, 1 recent fall 2.5 weeks ago ADLs Comments: mod I, drives, cleans, wife cooks     Extremity/Trunk Assessment   Upper Extremity Assessment Upper Extremity Assessment: Defer to OT evaluation    Lower Extremity Assessment Lower Extremity Assessment: Generalized weakness;LLE deficits/detail (noted functionally) LLE Deficits / Details: hx of traumatic injury to knee and TKA years ago with pt reporting chronic L knee pain    Cervical / Trunk Assessment Cervical / Trunk Assessment: Kyphotic  Communication   Communication Communication: No apparent difficulties    Cognition Arousal: Alert Behavior During Therapy: WFL for tasks assessed/performed   PT - Cognitive impairments: No apparent impairments                         Following commands: Intact       Cueing Cueing Techniques: Verbal cues     General Comments General comments (skin integrity, edema, etc.): SpO2 >/= 92% on RA throughout; HR up to low 100s; Orthostatics - 118/86 (97) supine, 115/84 (95) &  95 bpm sitting, 124/87 (99) & 106 bpm standing, 127/81 (93) & 102 bpm standing ~3 min; Head impulsive test, bil horizontal canal tests, and bil Dix Hallpike tests negative; poor smooth pursuits noted; Pt reports the bad taste and hot sensation in his mouth along with the spreading pain through his L chest, arm, and head occur normally ~10 min after he lays down or in the middle of the night and it wakes him up. He reports he sleeps flat on his R side. Suggested to pt to sleep with his trunk and head semi propped up by pillows to see if that helps manage his symptoms pending MD findings. Pt also expressed several falls in which he was worried he would be unable to get up on his own, especially when outdoors. Demonstrated and educated pt on different ways to recover after a  fall and educated pt to keep a charged phone on him or get a life alert button if needed in case he were to fall. Also educated pt to get a referral to a vestibular PT if he continues to have intermittent vertigo. He verbalized understanding of all education.    Exercises     Assessment/Plan    PT Assessment Patient needs continued PT services  PT Problem List Decreased strength;Decreased activity tolerance;Decreased balance;Decreased mobility;Pain       PT Treatment Interventions Gait training;DME instruction;Stair training;Functional mobility training;Therapeutic activities;Therapeutic exercise;Balance training;Neuromuscular re-education;Patient/family education    PT Goals (Current goals can be found in the Care Plan section)  Acute Rehab PT Goals Patient Stated Goal: to find the cause of his symptoms PT Goal Formulation: With patient/family Time For Goal Achievement: 08/28/24 Potential to Achieve Goals: Good    Frequency Min 1X/week     Co-evaluation               AM-PAC PT 6 Clicks Mobility  Outcome Measure Help needed turning from your back to your side while in a flat bed without using bedrails?: A Little Help needed moving from lying on your back to sitting on the side of a flat bed without using bedrails?: A Little Help needed moving to and from a bed to a chair (including a wheelchair)?: A Little Help needed standing up from a chair using your arms (e.g., wheelchair or bedside chair)?: A Little Help needed to walk in hospital room?: A Little Help needed climbing 3-5 steps with a railing? : A Little 6 Click Score: 18    End of Session   Activity Tolerance: Patient tolerated treatment well Patient left: in bed;with call bell/phone within reach;with bed alarm set;with family/visitor present   PT Visit Diagnosis: Unsteadiness on feet (R26.81);Other abnormalities of gait and mobility (R26.89);Muscle weakness (generalized) (M62.81);Difficulty in walking, not  elsewhere classified (R26.2);Dizziness and giddiness (R42)    Time: 8378-8297 PT Time Calculation (min) (ACUTE ONLY): 41 min   Charges:   PT Evaluation $PT Eval Low Complexity: 1 Low PT Treatments $Therapeutic Activity: 23-37 mins PT General Charges $$ ACUTE PT VISIT: 1 Visit         Theo Ferretti, PT, DPT Acute Rehabilitation Services  Office: [phone]   Theo CHRISTELLA Ferretti 08/14/2024, 5:26 PM

## 2024-08-14 NOTE — Care Management Obs Status (Signed)
 MEDICARE OBSERVATION STATUS NOTIFICATION   Patient Details  Name: IRINEO GAULIN MRN: [id number] Date of Birth: 07-02-1946   Medicare Observation Status Notification Given:       Vonzell Arrie Sharps 08/14/2024, 8:47 AM

## 2024-08-14 NOTE — Progress Notes (Cosign Needed Addendum)
 "  HD#0 SUBJECTIVE:  Patient Summary: [name] is a 79 y.o. male with a pertinent PMH of pulmonary sarcoidosis, autoimmune pancreatitis, chronic lumbar back pain, chronic headaches, glaucoma, and hepatitis C (treated in 2016) who presented with chest pain and admitted for further workup.   Overnight Events: Rapid response initially called due to back pain radiating to chest and up to his neck and head. By time night MD arrived bedside pain subsided and patient noted muscle spasms + a burning sensation in his mouth. Patient states these are normal episodes for him and usually resolve with tizanidine and tramadol . Per rapid response RN note, pt was anxious and tachypneic. Received home tramadol , no other acute concerns.  Interim History: Patient states that last night his chest all the way up to his head Very hard during his episode last night.  He had pain in the back of his leg that extended to his back, chest, and head.  He has been going to [facility] for back injections which has helped a little bit but not much.  Describes an electric-like sensation shooting down his spine.  Also states that he has been constipated recently and is requesting a stool softener. The patient describes  OBJECTIVE:  Vital Signs: Vitals:   08/13/24 2309 08/14/24 0342 08/14/24 0439 08/14/24 0818  BP: 122/87 (!) 143/92  120/79  Pulse: 63 62  60  Resp: 18 18  16   Temp: 98.5 F (36.9 C)   (!) 97 F (36.1 C)  TempSrc: Oral   Axillary  SpO2: 98% 98%    Weight:   75.2 kg   Height:        Filed Weights   08/13/24 0625 08/13/24 1652 08/14/24 0439  Weight: 76.1 kg 75.3 kg 75.2 kg     Intake/Output Summary (Last 24 hours) at 08/14/2024 1127 Last data filed at 08/14/2024 0800 Gross per 24 hour  Intake 614.29 ml  Output --  Net 614.29 ml   Net IO Since Admission: 614.29 mL [08/14/24 1127]  Physical Exam: Constitutional: Well-appearing elderly manin no acute distress HENT: mucous membranes moist, scarring  noted to left cheek Eyes: conjunctiva non-erythematous, PERRL, no scleral icterus Cardiovascular: regular rate and rhythm, no m/r/g Pulmonary/Chest: normal work of breathing on room air, anterior lung fields CTAB Abdominal: soft, non-tender, non-distended Neurological: alert & oriented x3, no sensory deficits, left-sided visual deficits in the left eye (consistent with baseline), otherwise CN III-XII intact; 5/5 strength in bilateral upper extremities; 2/5 strength in bilateral lower extremities (consistent with baseline) Skin: warm and dry Extremities: no edema or cyanosis Psych: normal mood and affect, thought content normal  Patient Lines/Drains/Airways Status     Active Line/Drains/Airways     Name Placement date Placement time Site Days   Peripheral IV 08/13/24 20 G Anterior;Distal;Right;Upper Arm 08/13/24  0518  Arm  1   Peripheral IV 08/13/24 20 G Distal;Posterior;Right Forearm 08/13/24  1247  Forearm  1            Pertinent labs and imaging:     Latest Ref Rng & Units 08/14/2024    2:33 AM 08/12/2024    6:33 PM 07/14/2024    1:16 PM  CBC  WBC 4.0 - 10.5 K/uL 4.6  5.1  5.0   Hemoglobin 13.0 - 17.0 g/dL 87.0  86.3  86.3   Hematocrit 39.0 - 52.0 % 38.3  40.3  41.1   Platelets 150 - 400 K/uL 186  220  195  Latest Ref Rng & Units 08/14/2024    2:33 AM 08/12/2024    6:33 PM 07/14/2024    1:16 PM  CMP  Glucose 70 - 99 mg/dL 89  77  93   BUN 8 - 23 mg/dL 17  17  13    Creatinine 0.61 - 1.24 mg/dL 9.47  9.32  9.36   Sodium 135 - 145 mmol/L 136  141  138   Potassium 3.5 - 5.1 mmol/L 3.5  3.9  4.2   Chloride 98 - 111 mmol/L 103  105  103   CO2 22 - 32 mmol/L 24  26  28    Calcium 8.9 - 10.3 mg/dL 8.8  9.2  9.2   Total Protein 6.5 - 8.1 g/dL 8.0  8.8  8.5   Total Bilirubin 0.0 - 1.2 mg/dL 0.9  0.8  1.1   Alkaline Phos 38 - 126 U/L 50  54  52   AST 15 - 41 U/L 38  45  43   ALT 0 - 44 U/L 17  20  19      ECHOCARDIOGRAM COMPLETE Result Date: 08/13/2024     ECHOCARDIOGRAM REPORT   Patient Name:   [name] Date of Exam: 08/13/2024 Medical Rec #:  [id number]     Height:       71.0 in Accession #:    [id number]    Weight:       167.8 lb Date of Birth:  [birth year]    BSA:          1.957 m Patient Age:    78 years      BP:           127/91 mmHg Patient Gender: M             HR:           61 bpm. Exam Location:  Inpatient Procedure: 2D Echo, Cardiac Doppler, Color Doppler and Intracardiac            Opacification Agent (Both Spectral and Color Flow Doppler were            utilized during procedure). Indications:    Chest Pain R07.9  History:        Patient has no prior history of Echocardiogram examinations.                 Sarcoidosis.  Sonographer:    Merlynn Argyle Referring Phys: [id number] ELSIE KATHEE SAVANNAH IMPRESSIONS  1. Left ventricular ejection fraction, by estimation, is 45 to 50%. The left ventricle has mildly decreased function. The left ventricle demonstrates global hypokinesis. Left ventricular diastolic parameters were normal.  2. Right ventricular systolic function is mildly reduced. The right ventricular size is normal. There is normal pulmonary artery systolic pressure. The estimated right ventricular systolic pressure is 29.0 mmHg.  3. The mitral valve is normal in structure. No evidence of mitral valve regurgitation. No evidence of mitral stenosis.  4. The aortic valve is tricuspid. Aortic valve regurgitation is trivial. Aortic valve sclerosis/calcification is present, without any evidence of aortic stenosis. Aortic regurgitation PHT measures 737 msec.  5. Aortic dilatation noted. There is mild dilatation of the ascending aorta, measuring 39 mm.  6. The inferior vena cava is dilated in size with >50% respiratory variability, suggesting right atrial pressure of 8 mmHg. FINDINGS  Left Ventricle: Left ventricular ejection fraction, by estimation, is 45 to 50%. The left ventricle has mildly decreased function. The left ventricle demonstrates global hypokinesis.  Definity  contrast  agent was given IV to delineate the left ventricular  endocardial borders. The left ventricular internal cavity size was normal in size. There is no left ventricular hypertrophy. Abnormal (paradoxical) septal motion, consistent with left bundle branch block. Left ventricular diastolic parameters were normal. Normal left ventricular filling pressure. Right Ventricle: The right ventricular size is normal. No increase in right ventricular wall thickness. Right ventricular systolic function is mildly reduced. There is normal pulmonary artery systolic pressure. The tricuspid regurgitant velocity is 2.29 m/s, and with an assumed right atrial pressure of 8 mmHg, the estimated right ventricular systolic pressure is 29.0 mmHg. Left Atrium: Left atrial size was normal in size. Right Atrium: Right atrial size was normal in size. Pericardium: There is no evidence of pericardial effusion. Mitral Valve: The mitral valve is normal in structure. No evidence of mitral valve regurgitation. No evidence of mitral valve stenosis. Tricuspid Valve: The tricuspid valve is normal in structure. Tricuspid valve regurgitation is mild . No evidence of tricuspid stenosis. Aortic Valve: The aortic valve is tricuspid. Aortic valve regurgitation is trivial. Aortic regurgitation PHT measures 737 msec. Aortic valve sclerosis/calcification is present, without any evidence of aortic stenosis. Pulmonic Valve: The pulmonic valve was normal in structure. Pulmonic valve regurgitation is not visualized. No evidence of pulmonic stenosis. Aorta: Aortic dilatation noted. There is mild dilatation of the ascending aorta, measuring 39 mm. Venous: The inferior vena cava is dilated in size with greater than 50% respiratory variability, suggesting right atrial pressure of 8 mmHg. IAS/Shunts: No atrial level shunt detected by color flow Doppler.  LEFT VENTRICLE PLAX 2D LVIDd:         3.70 cm      Diastology LVIDs:         3.00 cm      LV e' medial:     8.70 cm/s LV PW:         1.20 cm      LV E/e' medial:  5.0 LV IVS:        1.10 cm      LV e' lateral:   11.00 cm/s LVOT diam:     2.20 cm      LV E/e' lateral: 3.9 LV SV:         73 LV SV Index:   37 LVOT Area:     3.80 cm  LV Volumes (MOD) LV vol d, MOD A2C: 89.0 ml LV vol d, MOD A4C: 111.0 ml LV vol s, MOD A2C: 52.3 ml LV vol s, MOD A4C: 63.0 ml LV SV MOD A2C:     36.7 ml LV SV MOD A4C:     111.0 ml LV SV MOD BP:      44.7 ml RIGHT VENTRICLE            IVC RV Basal diam:  3.50 cm    IVC diam: 2.30 cm RV S prime:     9.32 cm/s TAPSE (M-mode): 1.4 cm LEFT ATRIUM             Index        RIGHT ATRIUM           Index LA diam:        2.50 cm 1.28 cm/m   RA Area:     15.20 cm LA Vol (A2C):   35.9 ml 18.35 ml/m  RA Volume:   35.30 ml  18.04 ml/m LA Vol (A4C):   19.7 ml 10.07 ml/m LA Biplane Vol: 28.5 ml 14.56 ml/m  AORTIC VALVE  LVOT Vmax:   101.00 cm/s LVOT Vmean:  63.400 cm/s LVOT VTI:    0.193 m AI PHT:      737 msec  AORTA Ao Root diam: 3.70 cm Ao Asc diam:  3.90 cm MITRAL VALVE               TRICUSPID VALVE MV Area (PHT): 2.07 cm    TR Peak grad:   21.0 mmHg MV Decel Time: 367 msec    TR Vmax:        229.00 cm/s MV E velocity: 43.30 cm/s MV A velocity: 81.80 cm/s  SHUNTS MV E/A ratio:  0.53        Systemic VTI:  0.19 m                            Systemic Diam: 2.20 cm Wilbert Bihari MD Electronically signed by Wilbert Bihari MD Signature Date/Time: 08/13/2024/7:38:33 PM    Final    CT HEAD W & WO CONTRAST ( ) Result Date: 08/13/2024 EXAM: CT HEAD WITHOUT AND WITH CONTRAST 08/13/2024 04:22:57 PM TECHNIQUE: CT of the head was performed without and with the administration of intravenous contrast. 75 mL iohexol  (OMNIPAQUE ) 350 MG/ML injection was administered. Automated exposure control, iterative reconstruction, and/or weight based adjustment of the mA/kV was utilized to reduce the radiation dose to as low as reasonably achievable. COMPARISON: Head CT 07/14/2024 and MRI 09/26/2022. CLINICAL HISTORY: Headache,  increasing frequency or severity. FINDINGS: BRAIN AND VENTRICLES: There is no evidence of an acute infarct, intracranial hemorrhage, mass, midline shift, hydrocephalus, or extra-axial fluid collection. Cerebral volume is normal. No abnormal enhancement is identified. The major dural venous sinuses appear patent. ORBITS: Bilateral cataract extraction. SINUSES AND MASTOIDS: No acute abnormality. SOFT TISSUES AND SKULL: No focal bone lesion. No acute soft tissue abnormality. IMPRESSION: 1. Negative contrast enhanced head CT. Electronically signed by: Dasie Hamburg MD 08/13/2024 05:16 PM EST RP Workstation: [id number]    ASSESSMENT/PLAN:  Assessment: Principal Problem:   Chest pain Active Problems:   Sarcoidosis   Headache   Elevated troponin   Multilevel spine pain   ASHTAN GIRTMAN is a 79 y.o. male with a history of pulmonary sarcoidosis, autoimmune pancreatitis, chronic lumbar back pain, chronic headaches, glaucoma, and hepatitis C (treated in 2016) who presented with chest pain and admitted for further workup, now on hospital day 0.  Plan: #Atypical Chest Pain #Elevated Troponins Initially presented with chest pain without concerning EKG changes.  T wave inversions isolated to V1 which is nonspecific and consistent with prior EKGs.  Elevated troponin initially concerning (trend of 63->69->75->75->80) but low delta and chest pain has resolved. Has atypical pain that may be radiating from prior chronic back pain which he still experiences. Hemodynamically stable. Cardiology is following and recommended cardiac stress MRI which was completed earlier today. Trying to rule out ischemia vs structural cardiac disease given history of pulmonary sarcoidosis. Pending formal read later tonight with updated recommendations likely coming tomorrow morning.  Other etiologies include non-cardiac chest pain such as MSK related versus atypical angina.  Anticipate discharge tomorrow as patient initially wanted to leave  today but convinced to stay. - Awaiting further cardiology recs after cardiac stress MRI read - Lipid panel pending  #Chronic lumbar pain #Facet arthropathy #Concern for CIDP #Chronic Headaches Patient had episode of back/chest/head pain with warm sensation to head and endorsing what sounds like Lhermitte sign.  Patient has had extensive workup for back pain that radiates  to the legs.  Chronically weak BLE which is at his baseline. CT head obtained this admission negative for acute intracranial abnormality.  He follows with Dr. Waylon ([facility]) and has received spinal injections which only provide minimal relief.  He is scheduled for a L4/L5 and L5/S1 rhizotomy in March 2026.  Has also previously been evaluated by neurosurgery (Dr. Hillman) in 2021 with concern for chronic inflammatory demyelinating polyneuropathy.  Per chart review, appears patient has tried gabapentin in the past and stopped due to nausea, but do not see any current medications that would help with neuropathic pain such as Cymbalta  or Lyrica.  Plan to discuss this with patient tomorrow.  Goal will be to offer temporizing measures until his procedure in March. 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  Previously following with [facility], but lost to follow-up in 2018.  Continuing to take prednisone  3 mg daily.  Pulmonology consulted this admission and scheduled outpatient follow-up with Dr. Theophilus. - Continue home prednisone  3 mg daily - Outpatient pulmonology follow up  #Elevated BP BP labile from 120s up to 140s systolic. No formal documented history of hypertension. Do not see any antihypertensives on home med list or dispensed medications. Will continue monitoring and await final recs from cards.  #Constipation -  MiraLax  17 g daily - Senna-docusate 1 tablet daily  Chronic Stable Medical Conditions #Glaucoma - Continue Home timolol , dorzolamide , brimonidine , latanoprost   Best Practice: Diet: Regular IVF: Fluids: None, Rate: None VTE: enoxaparin  (LOVENOX ) injection 40 mg Start: 08/14/24 1000 Code: Full  Disposition planning: Therapy Recs: Pending, DME: none Family Contact: Spouse at bedside DISPO: Anticipated discharge tomorrow to Home pending final cardiology recs.  Signature:  Letha Cheadle, MD [facility]  PGY-1 08/14/2024, 11:27 AM  On Call pager [phone]  "

## 2024-08-15 DIAGNOSIS — I77819 Aortic ectasia, unspecified site: Secondary | ICD-10-CM | POA: Diagnosis not present

## 2024-08-15 DIAGNOSIS — M47819 Spondylosis without myelopathy or radiculopathy, site unspecified: Secondary | ICD-10-CM | POA: Diagnosis not present

## 2024-08-15 DIAGNOSIS — R7989 Other specified abnormal findings of blood chemistry: Secondary | ICD-10-CM | POA: Diagnosis not present

## 2024-08-15 DIAGNOSIS — R519 Headache, unspecified: Secondary | ICD-10-CM | POA: Diagnosis not present

## 2024-08-15 DIAGNOSIS — M549 Dorsalgia, unspecified: Secondary | ICD-10-CM | POA: Diagnosis not present

## 2024-08-15 DIAGNOSIS — R030 Elevated blood-pressure reading, without diagnosis of hypertension: Secondary | ICD-10-CM

## 2024-08-15 DIAGNOSIS — R0789 Other chest pain: Principal | ICD-10-CM

## 2024-08-15 DIAGNOSIS — G8929 Other chronic pain: Secondary | ICD-10-CM | POA: Diagnosis not present

## 2024-08-15 DIAGNOSIS — H409 Unspecified glaucoma: Secondary | ICD-10-CM

## 2024-08-15 DIAGNOSIS — D869 Sarcoidosis, unspecified: Secondary | ICD-10-CM

## 2024-08-15 DIAGNOSIS — R079 Chest pain, unspecified: Secondary | ICD-10-CM | POA: Diagnosis not present

## 2024-08-15 LAB — BASIC METABOLIC PANEL WITH GFR
Anion gap: 11 (ref 5–15)
BUN: 18 mg/dL (ref 8–23)
CO2: 21 mmol/L — ABNORMAL LOW (ref 22–32)
Calcium: 8.8 mg/dL — ABNORMAL LOW (ref 8.9–10.3)
Chloride: 104 mmol/L (ref 98–111)
Creatinine, Ser: 0.61 mg/dL (ref 0.61–1.24)
GFR, Estimated: >60 mL/min
Glucose, Bld: 96 mg/dL (ref 70–99)
Potassium: 3.9 mmol/L (ref 3.5–5.1)
Sodium: 136 mmol/L (ref 135–145)

## 2024-08-15 LAB — CBC
HCT: 40 % (ref 39.0–52.0)
Hemoglobin: 13.7 g/dL (ref 13.0–17.0)
MCH: 29.5 pg (ref 26.0–34.0)
MCHC: 34.3 g/dL (ref 30.0–36.0)
MCV: 86.2 fL (ref 80.0–100.0)
Platelets: 193 10*3/uL (ref 150–400)
RBC: 4.64 MIL/uL (ref 4.22–5.81)
RDW: 13.4 % (ref 11.5–15.5)
WBC: 4.7 10*3/uL (ref 4.0–10.5)
nRBC: 0 % (ref 0.0–0.2)

## 2024-08-15 LAB — LIPID PANEL
Cholesterol: 153 mg/dL (ref 0–200)
HDL: 30 mg/dL — ABNORMAL LOW
LDL Cholesterol: 109 mg/dL — ABNORMAL HIGH (ref 0–99)
Total CHOL/HDL Ratio: 5.1 ratio
Triglycerides: 72 mg/dL
VLDL: 14 mg/dL (ref 0–40)

## 2024-08-15 MED ORDER — DULOXETINE HCL 30 MG PO CPEP: 30.0000 mg | ORAL_CAPSULE | Freq: Every day | ORAL | 0 refills | Status: AC

## 2024-08-15 MED ORDER — TRAMADOL HCL 50 MG PO TABS: 50.0000 mg | ORAL_TABLET | Freq: Four times a day (QID) | ORAL | 0 refills | Status: AC | PRN

## 2024-08-15 MED ORDER — DULOXETINE HCL 30 MG PO CPEP: 30.0000 mg | ORAL_CAPSULE | Freq: Two times a day (BID) | ORAL | 2 refills | Status: DC

## 2024-08-15 NOTE — Progress Notes (Signed)
 "   Progress Note  Patient Name: [name] Date of Encounter: 08/15/2024  Primary Cardiologist: Georganna Archer, MD  Interval Summary  Chart reviewed.  Patient denies having any chest discomfort or shortness of breath today.  I spoke with his sister present in the room, his wife was on speaker phone as well.  I went over the results of his cardiac MRI which is reassuring from a cardiac perspective.  I recommended that he have outpatient follow-up for his pulmonary sarcoidosis diagnosis particularly based on his chest CT findings.  Vital Signs  Vitals:   08/14/24 1929 08/14/24 2351 08/15/24 0413 08/15/24 0500  BP: 128/85 115/84 120/88   Pulse: 89 79 71   Resp: 17 18 18    Temp: 98.5 F (36.9 C) 97.9 F (36.6 C) 98.7 F (37.1 C)   TempSrc: Oral Oral Oral   SpO2: 97% 96% 95%   Weight:    73.3 kg  Height:       No intake or output data in the 24 hours ending 08/15/24 0843 Filed Weights   08/13/24 1652 08/14/24 0439 08/15/24 0500  Weight: 75.3 kg 75.2 kg 73.3 kg    Physical Exam  GEN: No acute distress.   Neck: No JVD. Cardiac: RRR, no murmur, rub, or gallop.  Respiratory: Nonlabored. Clear to auscultation anteriorly. GI: Soft, nontender, bowel sounds present. MS: No edema. Neuro:  Nonfocal. Psych: Alert and oriented x 3. Normal affect.  ECG/Telemetry  Telemetry reviewed showing sinus rhythm.  Labs  Chemistry Recent Labs  Lab 08/12/24 1833 08/14/24 0233 08/15/24 0259  NA 141 136 136  K 3.9 3.5 3.9  CL 105 103 104  CO2 26 24 21*  GLUCOSE 77 89 96  BUN 17 17 18   CREATININE 0.67 0.52* 0.61  CALCIUM 9.2 8.8* 8.8*  PROT 8.8* 8.0  --   ALBUMIN 3.8 3.4*  --   AST 45* 38  --   ALT 20 17  --   ALKPHOS 54 50  --   BILITOT 0.8 0.9  --   GFRNONAA >60 >60 >60  ANIONGAP 11 9 11     Hematology Recent Labs  Lab 08/12/24 1833 08/14/24 0233 08/15/24 0259  WBC 5.1 4.6 4.7  RBC 4.57 4.40 4.64  HGB 13.6 12.9* 13.7  HCT 40.3 38.3* 40.0  MCV 88.2 87.0 86.2   MCH 29.8 29.3 29.5  MCHC 33.7 33.7 34.3  RDW 13.5 13.4 13.4  PLT 220 186 193   Cardiac Enzymes Recent Labs  Lab 08/12/24 1833 08/12/24 2127 08/13/24 1044 08/13/24 1900  TRNPT 63* 69* 75*  75* 80*    Lipid Panel     Component Value Date/Time   CHOL 153 08/15/2024 0259   TRIG 72 08/15/2024 0259   HDL 30 (L) 08/15/2024 0259   CHOLHDL 5.1 08/15/2024 0259   VLDL 14 08/15/2024 0259   LDLCALC 109 (H) 08/15/2024 0259    Cardiac Studies  Echocardiogram 08/13/2024:  1. Left ventricular ejection fraction, by estimation, is 45 to 50%. The  left ventricle has mildly decreased function. The left ventricle  demonstrates global hypokinesis. Left ventricular diastolic parameters  were normal.   2. Right ventricular systolic function is mildly reduced. The right  ventricular size is normal. There is normal pulmonary artery systolic  pressure. The estimated right ventricular systolic pressure is 29.0 mmHg.   3. The mitral valve is normal in structure. No evidence of mitral valve  regurgitation. No evidence of mitral stenosis.   4. The aortic valve  is tricuspid. Aortic valve regurgitation is trivial.  Aortic valve sclerosis/calcification is present, without any evidence of  aortic stenosis. Aortic regurgitation PHT measures 737 msec.   5. Aortic dilatation noted. There is mild dilatation of the ascending  aorta, measuring 39 mm.   6. The inferior vena cava is dilated in size with >50% respiratory  variability, suggesting right atrial pressure of 8 mmHg.   Stress cardiac MRI 08/14/2024: IMPRESSION: 1.  No ischemia on stress perfusion imaging   2. No evidence of cardiac sarcoidosis. No LGE to suggest myocardial scar   3. Normal LV size, mild hypertrophy, and normal systolic function (EF 55%)   4.  Normal RV size with mild systolic dysfunction (EF 47%)  Assessment & Plan  1.  Atypical thoracic discomfort.  High-sensitivity troponin T levels minimally increased and in flat pattern  not consistent with ACS.  ESR 42, no pericardial rub or pericardial effusion.  ECG not consistent with pericarditis.  Initial echocardiogram reported LVEF 45 to 50% as well as mild RV dysfunction, however subsequent stress cardiac MRI performed yesterday was more reassuring showing no evidence of ischemia with LVEF 55% and RVEF 47%.  LGE did not suggest evidence of myocardial scar or cardiac sarcoidosis.  I discussed the results with the patient, his wife by speaker phone, and his sister present in the room.  At this point no further inpatient cardiac testing is being pursued.  I have recommended that he keep regular follow-up with [facility] as an outpatient.  2.  Mild dilatation of ascending aorta at 39 mm by echocardiography, asymptomatic.  3.  Known history of pulmonary sarcoidosis.  Suggest that he have regular follow-up as an outpatient with [facility].  Discussed with primary team.  No further inpatient cardiac testing is planned at this point.  We will sign off.  For questions or updates, please contact [facility] Please consult [email or web address] for contact info under   Signed, Jayson Sierras, MD  08/15/2024, 8:43 AM    "

## 2024-08-15 NOTE — Discharge Summary (Signed)
 "  Name: [name] MRN: [id number] DOB: January 23, 1946 79 y.o. PCP: [name], MD  Date of Admission: 08/12/2024  5:48 PM Date of Discharge: 08/15/2024 Attending Physician: Dr. CHARLENA Eastern  Discharge Diagnosis: Principal Problem:   Chest pain Active Problems:   Sarcoidosis   Headache   Elevated troponin   Multilevel spine pain   Elevated troponin I level    Discharge Medications: Allergies as of 08/15/2024       Reactions   Gabapentin Nausea Only, Other (See Comments)   Dizziness   Penicillins Other (See Comments)   Unknown reaction 30+years ago        Medication List     STOP taking these medications    meclizine  25 MG tablet Commonly known as: ANTIVERT    oxyCODONE -acetaminophen  5-325 MG tablet Commonly known as: PERCOCET/ROXICET       TAKE these medications    acetaminophen  650 MG CR tablet Commonly known as: TYLENOL  Take 1,300 mg by mouth in the morning, at noon, and at bedtime.   brimonidine  0.2 % ophthalmic solution Commonly known as: ALPHAGAN  Place 1 drop into the right eye 2 (two) times a day.   brimonidine  0.15 % ophthalmic solution Commonly known as: ALPHAGAN  Place 1 drop into the right eye 2 (two) times daily.   brinzolamide 1 % ophthalmic suspension Commonly known as: AZOPT Place 1 drop into the right eye 2 (two) times a day.   cyclobenzaprine  5 MG tablet Commonly known as: FLEXERIL  Take 5 mg by mouth daily.   dorzolamide  2 % ophthalmic solution Commonly known as: TRUSOPT  Place 1 drop into the right eye 2 (two) times daily.   DULoxetine  30 MG capsule Commonly known as: Cymbalta  Take 1 capsule (30 mg total) by mouth daily.   latanoprost  0.005 % ophthalmic solution Commonly known as: XALATAN  Place 1 drop into both eyes at bedtime. As directed   predniSONE  1 MG tablet Commonly known as: DELTASONE  Take 3 mg by mouth daily.   rosuvastatin 10 MG tablet Commonly known as: CRESTOR Take 10 mg by mouth.   timolol  0.5 %  ophthalmic solution Commonly known as: TIMOPTIC  Place 1 drop into both eyes 2 (two) times daily.   traMADol  50 MG tablet Commonly known as: ULTRAM  Take 1 tablet (50 mg total) by mouth every 6 (six) hours as needed. What changed:  when to take this reasons to take this        Disposition and follow-up:   [name] was discharged from [facility] in [place] condition.  At the hospital follow up visit please address:  1.  Follow-up:  a.  Chronic lumbar pain Stable, started him on Cymbalta  30 mg once daily.  Continued home tramadol  and acetaminophen .  Per chart review, he has upcoming L4-L5/L5-S1 rhizotomy (March 2026). -At risk for serotonin syndrome, please monitor.   b.  Pulmonary sarcoidosis Chronic, on prednisone  3 mg.  Has a follow-up appointment with Dr. Theophilus.  2.  Labs / imaging needed at time of follow-up: None  3.  Pending labs/ test needing follow-up: None  4.  Medication Changes Abx -   End Date:  Follow-up Appointments:  Follow-up Information     [name], MD Follow up.   Specialty: Family Medicine Why: doctors office state they will give you a call to schedule your follow up apt Contact information: [address] [place] [id number] [phone]  Hospital Course by problem list: Atypical Chest Pain #Elevated Troponins Initially presented with chest pain without ischemic EKG changes; isolated V1 T-wave inversions were nonspecific and unchanged from prior tracings.  Mild troponin elevation which flattened 63>>80. Initial echocardiogram reported LVEF 45 to 50% as well as mild RV dysfunction, however subsequent stress cardiac MRI performed was more reassuring showing no evidence of ischemia with LVEF 55% and RVEF 47%. LGE did not suggest evidence of myocardial scar or cardiac sarcoidosis  Hemodynamically stable. Pain likely musculoskeletal/atypical.  Patient encouraged to follow-up with  cardiology at [facility].  #Chronic lumbar pain #Facet arthropathy #Concern for CIDP #Chronic Headaches Chronic bilateral headaches with new unilateral headaches this admission; CT head normal. Episode of back/chest/head pain with warm sensation and possible Lhermitte sign, concerning for neuropathic etiology. Prior neurosurgery evaluation for possible CIDP. Follows with Dr. Waylon at [facility] pain clinic; injections minimally helpful. Scheduled for L4-L5/L5-S1 rhizotomy (March 2026). Started duloxetine  30 mg once daily at discharge; continued tramadol  with PRN cyclobenzaprine  and acetaminophen . Counselled on serotonin syndrome risk with tramadol  + duloxetine .   #Pulmonary Sarcoidosis History of positive QuantiFERON-TB Gold with negative biopsy/AFB stains 11 years ago. Lost to follow-up with [facility] since 2018; on chronic prednisone  3 mg daily. CTA chest showed stable chronic miliary nodules and bilateral ground-glass opacities (R>L). Pulmonology consulted--no inpatient intervention given stability and no symptoms. Outpatient follow-up arranged with Dr. Theophilus. Prednisone  continued.   #Elevated BP Mild elevated, but stable on discharge.  #Dilatation of Ascending Aorta Seen on TTE obtained this admission. Ascending aortic dilatation to 39 mm. Cardiology recommends follow up echo in 1 year.   Chronic Stable Medical Conditions #Glaucoma - Continued home timolol , dorzolamide , brimonidine , latanoprost  Discharge Subjective: Seen on rounds, sister at bedside.  Denies chest pain or SOB.  Discharge Exam:   BP (!) 130/91 (BP Location: Right Arm)   Pulse 98   Temp 98.3 F (36.8 C) (Oral)   Resp 18   Ht 5' 11 (1.803 m)   Wt 73.3 kg   SpO2 99%   BMI 22.54 kg/m   Constitutional: Not in acute distress Cardiovascular: regular rate and rhythm, no m/r/g Pulmonary/Chest: normal work of breathing on room air, lungs clear to auscultation bilaterally Neuro:  alert & oriented  x3, no sensory deficits, left-sided visual deficits in the left eye (consistent with baseline), otherwise CN III-XII intact; 5/5 strength in bilateral upper extremities; 2/5 strength in bilateral lower extremities (consistent with baseline    Pertinent Labs, Studies, and Procedures:     Latest Ref Rng & Units 08/15/2024    2:59 AM 08/14/2024    2:33 AM 08/12/2024    6:33 PM  CBC  WBC 4.0 - 10.5 K/uL 4.7  4.6  5.1   Hemoglobin 13.0 - 17.0 g/dL 86.2  87.0  86.3   Hematocrit 39.0 - 52.0 % 40.0  38.3  40.3   Platelets 150 - 400 K/uL 193  186  220        Latest Ref Rng & Units 08/15/2024    2:59 AM 08/14/2024    2:33 AM 08/12/2024    6:33 PM  CMP  Glucose 70 - 99 mg/dL 96  89  77   BUN 8 - 23 mg/dL 18  17  17    Creatinine 0.61 - 1.24 mg/dL 9.38  9.47  9.32   Sodium 135 - 145 mmol/L 136  136  141   Potassium 3.5 - 5.1 mmol/L 3.9  3.5  3.9   Chloride 98 -  111 mmol/L 104  103  105   CO2 22 - 32 mmol/L 21  24  26    Calcium 8.9 - 10.3 mg/dL 8.8  8.8  9.2   Total Protein 6.5 - 8.1 g/dL  8.0  8.8   Total Bilirubin 0.0 - 1.2 mg/dL  0.9  0.8   Alkaline Phos 38 - 126 U/L  50  54   AST 15 - 41 U/L  38  45   ALT 0 - 44 U/L  17  20     ECHOCARDIOGRAM COMPLETE Result Date: 08/13/2024    ECHOCARDIOGRAM REPORT   Patient Name:   WATT GEILER Date of Exam: 08/13/2024 Medical Rec #:  [id number]     Height:       71.0 in Accession #:    [id number]    Weight:       167.8 lb Date of Birth:  1946-02-17    BSA:          1.957 m Patient Age:    78 years      BP:           127/91 mmHg Patient Gender: M             HR:           61 bpm. Exam Location:  Inpatient Procedure: 2D Echo, Cardiac Doppler, Color Doppler and Intracardiac            Opacification Agent (Both Spectral and Color Flow Doppler were            utilized during procedure). Indications:    Chest Pain R07.9  History:        Patient has no prior history of Echocardiogram examinations.                 Sarcoidosis.  Sonographer:    Merlynn Argyle Referring  Phys: [id number] ELSIE KATHEE SAVANNAH IMPRESSIONS  1. Left ventricular ejection fraction, by estimation, is 45 to 50%. The left ventricle has mildly decreased function. The left ventricle demonstrates global hypokinesis. Left ventricular diastolic parameters were normal.  2. Right ventricular systolic function is mildly reduced. The right ventricular size is normal. There is normal pulmonary artery systolic pressure. The estimated right ventricular systolic pressure is 29.0 mmHg.  3. The mitral valve is normal in structure. No evidence of mitral valve regurgitation. No evidence of mitral stenosis.  4. The aortic valve is tricuspid. Aortic valve regurgitation is trivial. Aortic valve sclerosis/calcification is present, without any evidence of aortic stenosis. Aortic regurgitation PHT measures 737 msec.  5. Aortic dilatation noted. There is mild dilatation of the ascending aorta, measuring 39 mm.  6. The inferior vena cava is dilated in size with >50% respiratory variability, suggesting right atrial pressure of 8 mmHg. FINDINGS  Left Ventricle: Left ventricular ejection fraction, by estimation, is 45 to 50%. The left ventricle has mildly decreased function. The left ventricle demonstrates global hypokinesis. Definity  contrast agent was given IV to delineate the left ventricular  endocardial borders. The left ventricular internal cavity size was normal in size. There is no left ventricular hypertrophy. Abnormal (paradoxical) septal motion, consistent with left bundle branch block. Left ventricular diastolic parameters were normal. Normal left ventricular filling pressure. Right Ventricle: The right ventricular size is normal. No increase in right ventricular wall thickness. Right ventricular systolic function is mildly reduced. There is normal pulmonary artery systolic pressure. The tricuspid regurgitant velocity is 2.29 m/s, and with an assumed right atrial pressure of 8 mmHg,  the estimated right ventricular systolic  pressure is 29.0 mmHg. Left Atrium: Left atrial size was normal in size. Right Atrium: Right atrial size was normal in size. Pericardium: There is no evidence of pericardial effusion. Mitral Valve: The mitral valve is normal in structure. No evidence of mitral valve regurgitation. No evidence of mitral valve stenosis. Tricuspid Valve: The tricuspid valve is normal in structure. Tricuspid valve regurgitation is mild . No evidence of tricuspid stenosis. Aortic Valve: The aortic valve is tricuspid. Aortic valve regurgitation is trivial. Aortic regurgitation PHT measures 737 msec. Aortic valve sclerosis/calcification is present, without any evidence of aortic stenosis. Pulmonic Valve: The pulmonic valve was normal in structure. Pulmonic valve regurgitation is not visualized. No evidence of pulmonic stenosis. Aorta: Aortic dilatation noted. There is mild dilatation of the ascending aorta, measuring 39 mm. Venous: The inferior vena cava is dilated in size with greater than 50% respiratory variability, suggesting right atrial pressure of 8 mmHg. IAS/Shunts: No atrial level shunt detected by color flow Doppler.  LEFT VENTRICLE PLAX 2D LVIDd:         3.70 cm      Diastology LVIDs:         3.00 cm      LV e' medial:    8.70 cm/s LV PW:         1.20 cm      LV E/e' medial:  5.0 LV IVS:        1.10 cm      LV e' lateral:   11.00 cm/s LVOT diam:     2.20 cm      LV E/e' lateral: 3.9 LV SV:         73 LV SV Index:   37 LVOT Area:     3.80 cm  LV Volumes (MOD) LV vol d, MOD A2C: 89.0 ml LV vol d, MOD A4C: 111.0 ml LV vol s, MOD A2C: 52.3 ml LV vol s, MOD A4C: 63.0 ml LV SV MOD A2C:     36.7 ml LV SV MOD A4C:     111.0 ml LV SV MOD BP:      44.7 ml RIGHT VENTRICLE            IVC RV Basal diam:  3.50 cm    IVC diam: 2.30 cm RV S prime:     9.32 cm/s TAPSE (M-mode): 1.4 cm LEFT ATRIUM             Index        RIGHT ATRIUM           Index LA diam:        2.50 cm 1.28 cm/m   RA Area:     15.20 cm LA Vol (A2C):   35.9 ml 18.35 ml/m   RA Volume:   35.30 ml  18.04 ml/m LA Vol (A4C):   19.7 ml 10.07 ml/m LA Biplane Vol: 28.5 ml 14.56 ml/m  AORTIC VALVE LVOT Vmax:   101.00 cm/s LVOT Vmean:  63.400 cm/s LVOT VTI:    0.193 m AI PHT:      737 msec  AORTA Ao Root diam: 3.70 cm Ao Asc diam:  3.90 cm MITRAL VALVE               TRICUSPID VALVE MV Area (PHT): 2.07 cm    TR Peak grad:   21.0 mmHg MV Decel Time: 367 msec    TR Vmax:        229.00 cm/s MV E  velocity: 43.30 cm/s MV A velocity: 81.80 cm/s  SHUNTS MV E/A ratio:  0.53        Systemic VTI:  0.19 m                            Systemic Diam: 2.20 cm Wilbert Bihari MD Electronically signed by Wilbert Bihari MD Signature Date/Time: 08/13/2024/7:38:33 PM    Final    CT HEAD W & WO CONTRAST ( ) Result Date: 08/13/2024 EXAM: CT HEAD WITHOUT AND WITH CONTRAST 08/13/2024 04:22:57 PM TECHNIQUE: CT of the head was performed without and with the administration of intravenous contrast. 75 mL iohexol  (OMNIPAQUE ) 350 MG/ML injection was administered. Automated exposure control, iterative reconstruction, and/or weight based adjustment of the mA/kV was utilized to reduce the radiation dose to as low as reasonably achievable. COMPARISON: Head CT 07/14/2024 and MRI 09/26/2022. CLINICAL HISTORY: Headache, increasing frequency or severity. FINDINGS: BRAIN AND VENTRICLES: There is no evidence of an acute infarct, intracranial hemorrhage, mass, midline shift, hydrocephalus, or extra-axial fluid collection. Cerebral volume is normal. No abnormal enhancement is identified. The major dural venous sinuses appear patent. ORBITS: Bilateral cataract extraction. SINUSES AND MASTOIDS: No acute abnormality. SOFT TISSUES AND SKULL: No focal bone lesion. No acute soft tissue abnormality. IMPRESSION: 1. Negative contrast enhanced head CT. Electronically signed by: Dasie Hamburg MD 08/13/2024 05:16 PM EST RP Workstation: [id number]   CT Angio Chest Pulmonary Embolism (PE) W or WO Contrast Result Date: 08/13/2024 EXAM: CTA CHEST  08/13/2024 05:52:47 AM TECHNIQUE: CTA of the chest was performed without and with the administration of 75 mL of iohexol  (OMNIPAQUE ) 350 MG/ML injection. Multiplanar reformatted images are provided for review. MIP images are provided for review. Automated exposure control, iterative reconstruction, and/or weight based adjustment of the mA/kV was utilized to reduce the radiation dose to as low as reasonably achievable. COMPARISON: CT of the chest dated 10/25/2021. CLINICAL HISTORY: Pulmonary embolism (PE) suspected, high probability. FINDINGS: PULMONARY ARTERIES: Pulmonary arteries are adequately opacified for evaluation. There is no evidence of pulmonary embolus. The main pulmonary arteries are dilated, measuring approximately 3.0 cm in diameter bilaterally. MEDIASTINUM: The heart is mildly enlarged and there is mild calcific coronary artery disease. There is no acute abnormality of the thoracic aorta. LYMPH NODES: There are calcified mediastinal and bilateral hilar lymph nodes. No axillary lymphadenopathy. LUNGS AND PLEURA: There are innumerable miliary nodular opacities present within the lungs bilaterally, significantly worse on the right. There are patchy and ground-glass opacities present within the lungs bilaterally, primarily dependently and also worse on the right. No focal consolidation or pulmonary edema. No evidence of pleural effusion or pneumothorax. The most likely differential diagnoses for the miliary nodular opacities include disseminated infection (e.g., tuberculosis, fungal infection), sarcoidosis, or metastatic disease. Follow-up imaging, such as a dedicated high-resolution CT (HRCT) of the chest, may be considered for further characterization. UPPER ABDOMEN: Limited images of the upper abdomen are unremarkable. SOFT TISSUES AND BONES: No acute bone or soft tissue abnormality. IMPRESSION: 1. No evidence of pulmonary embolus. 2. Interval worsening of hazy, patchy opacification slash consolidation  of the lung bases, worse on the right and chronic underlying miliary nodular opacities and patchy ground-glass opacities bilaterally, worse on the right. Differential considerations include miliary infection (tuberculosis or fungal) and less likely inflammatory pneumoconiosis/hypersensitivity pneumonitis. 3. Calcified mediastinal and bilateral hilar lymph nodes, most consistent with prior granulomatous disease. 4. Dilated main pulmonary arteries bilaterally, which can be seen with pulmonary arterial hypertension. 5. Mildly  enlarged heart and mild calcific coronary artery disease. Electronically signed by: Evalene Coho MD 08/13/2024 06:38 AM EST RP Workstation: [id number]   DG Chest 1 View Result Date: 08/12/2024 EXAM: 1 VIEW(S) XRAY OF THE CHEST 08/12/2024 07:14:28 PM COMPARISON: 07/14/2024 CLINICAL HISTORY: Shortness of breath. FINDINGS: LUNGS AND PLEURA: Stable chronic nodular opacities demonstrating an upper lobe predominance, compatible with patient's known history of sarcoidosis. No superimposed focal consolidation. No pleural effusion. No pneumothorax. HEART AND MEDIASTINUM: No acute abnormality of the cardiac and mediastinal silhouettes. BONES AND SOFT TISSUES: No acute osseous abnormality. IMPRESSION: 1. No superimposed focal consolidation. 2. Stable chronic nodular opacities with upper lobe predominance, compatible with known sarcoidosis. Electronically signed by: Dorethia Molt MD 08/12/2024 07:22 PM EST RP Workstation: [id number]     Discharge Instructions: Discharge Instructions     Call MD for:  persistant dizziness or light-headedness   Complete by: As directed    Call MD for:  persistant nausea and vomiting   Complete by: As directed    Diet general   Complete by: As directed    Increase activity slowly   Complete by: As directed        Signed: Celestina Czar, MD 08/15/2024, 4:40 PM   Pager: [phone]  "

## 2024-09-11 ENCOUNTER — Inpatient Hospital Stay: Admitting: Pulmonary Disease
